# Patient Record
Sex: Male | Born: 1975 | Race: Black or African American | Hispanic: No | Marital: Single | Smoking: Current every day smoker
Health system: Southern US, Community
[De-identification: ages and names within clinical notes are randomized; demographics above are authoritative.]

## PROBLEM LIST (undated history)

## (undated) DIAGNOSIS — G629 Polyneuropathy, unspecified: Secondary | ICD-10-CM

## (undated) DIAGNOSIS — K859 Acute pancreatitis without necrosis or infection, unspecified: Secondary | ICD-10-CM

## (undated) DIAGNOSIS — F32A Depression, unspecified: Secondary | ICD-10-CM

## (undated) HISTORY — PX: TOOTH EXTRACTION: SUR596

## (undated) HISTORY — PX: HIP SURGERY: SHX245

## (undated) HISTORY — DX: Polyneuropathy, unspecified: G62.9

## (undated) HISTORY — DX: Depression, unspecified: F32.A

---

## 2005-03-30 ENCOUNTER — Emergency Department (HOSPITAL_COMMUNITY): Admission: EM | Admit: 2005-03-30 | Discharge: 2005-03-30 | Payer: Self-pay | Admitting: Emergency Medicine

## 2005-03-30 IMAGING — CR DG ABDOMEN ACUTE W/ 1V CHEST
2 series · 2 of 2 positions shown · non-contrast
Comparison: None.

CLINICAL DATA: Abdominal pain.  Nausea.  
 ACUTE ABDOMINAL SERIES:

[view not recorded (1 of 2)]
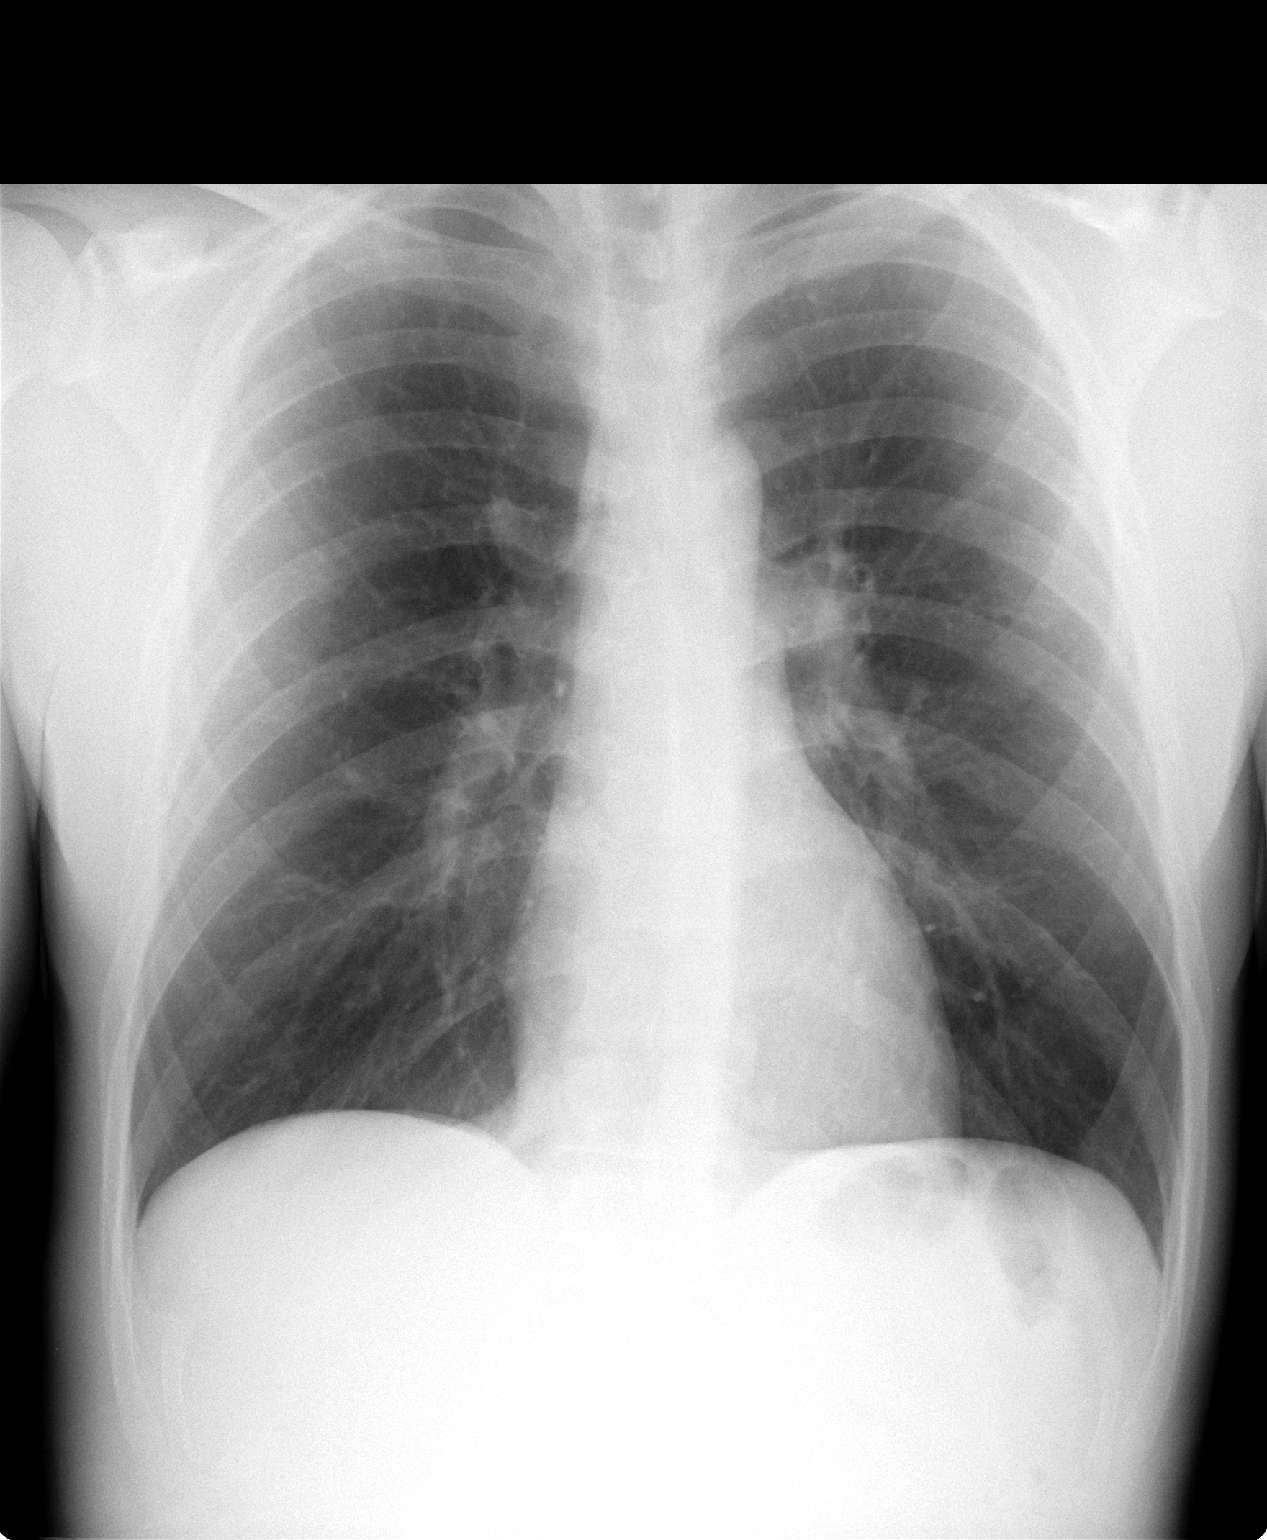

[view not recorded (2 of 2)]
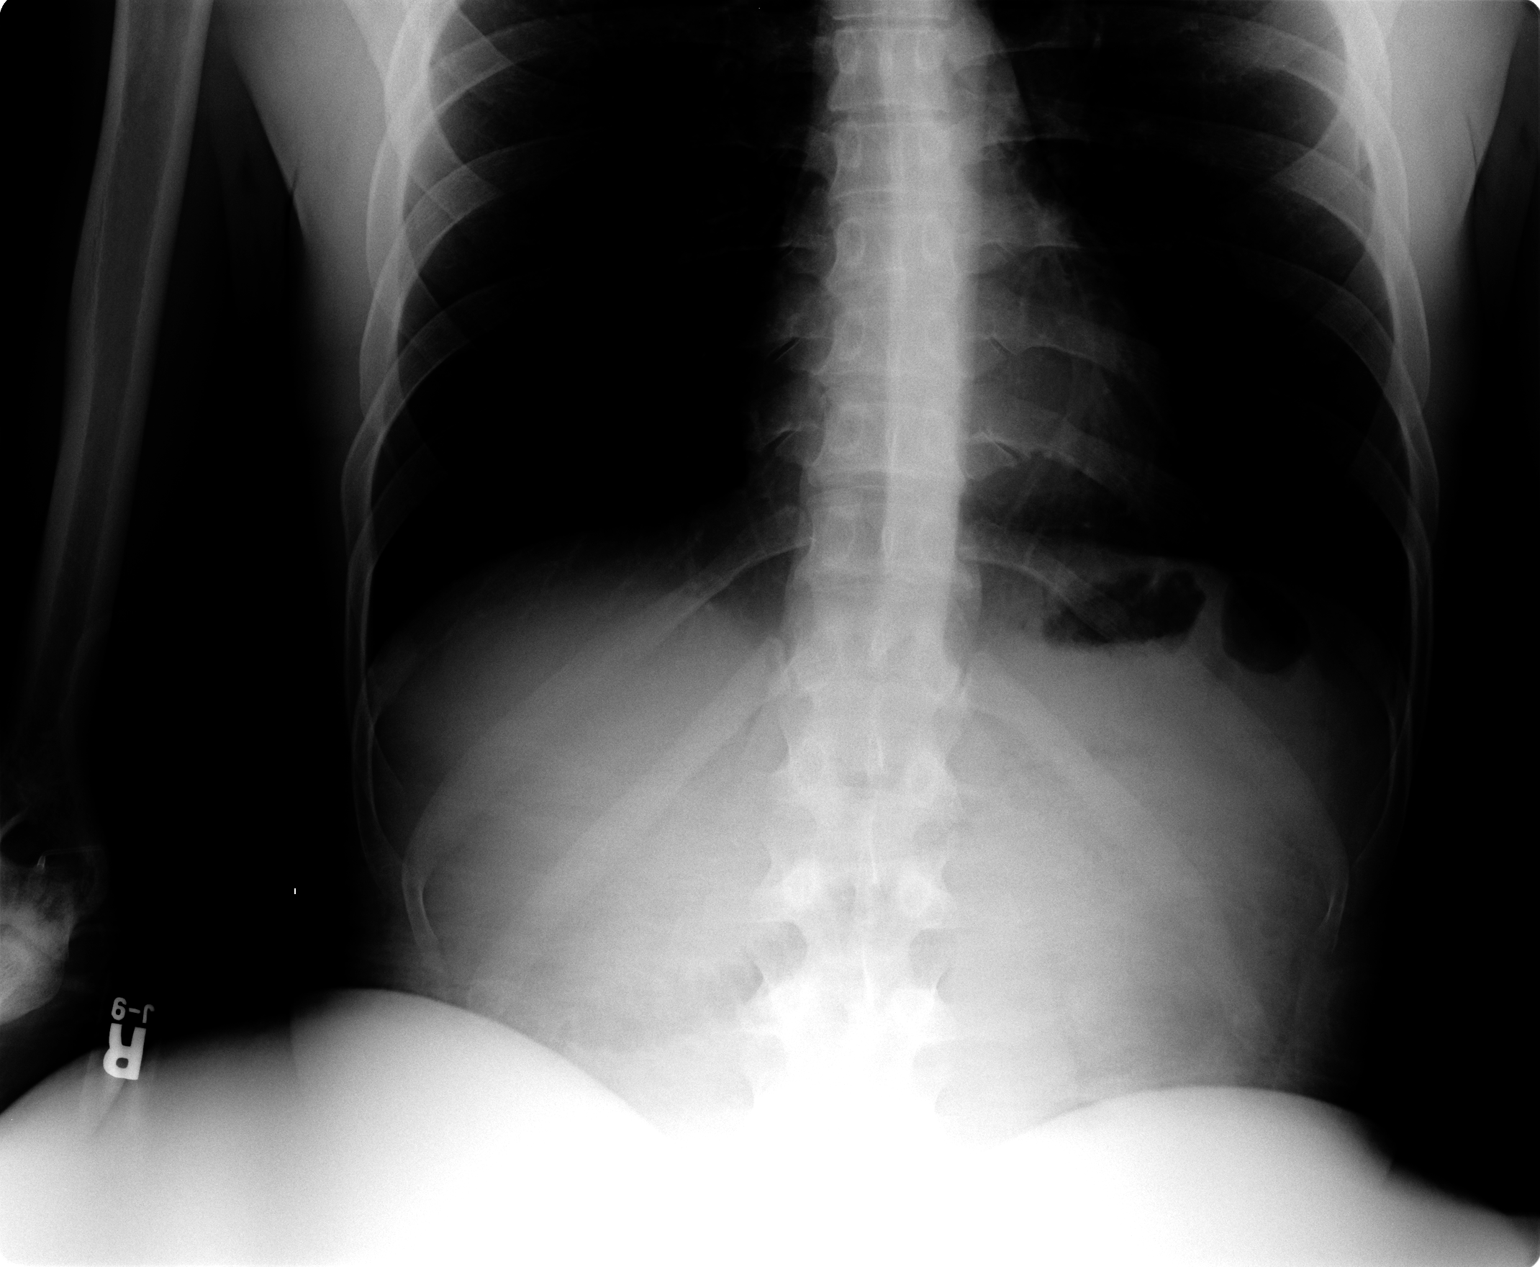

[2 of 2 positions shown; findings below may reference images not displayed]

FINDINGS: There is no evidence of dilated bowel loops or free intraperitoneal air.  No radiopaque calculi or other significant radiographic abnormality is seen.
 Heart size and mediastinal contours are within normal limits.  Both lungs are clear.
IMPRESSION: Negative abdominal radiographs.  No acute cardiopulmonary disease.

## 2007-07-11 ENCOUNTER — Emergency Department (HOSPITAL_COMMUNITY): Admission: EM | Admit: 2007-07-11 | Discharge: 2007-07-11 | Payer: Self-pay | Admitting: Emergency Medicine

## 2010-11-12 ENCOUNTER — Emergency Department: Payer: Self-pay | Admitting: Emergency Medicine

## 2011-10-12 ENCOUNTER — Emergency Department: Payer: Self-pay | Admitting: *Deleted

## 2011-10-12 IMAGING — CT CT HEAD WITHOUT CONTRAST
1 series · 16 of 30 positions shown, 20 images · non-contrast
Comparison: none

REASON FOR EXAM: headache
COMMENTS:

[Series 602: (id) · axial · 0.41mm/px · z∈[+409,+544]mm · 16 of 31 slices shown, 20 images]
[im 2/31  brain]
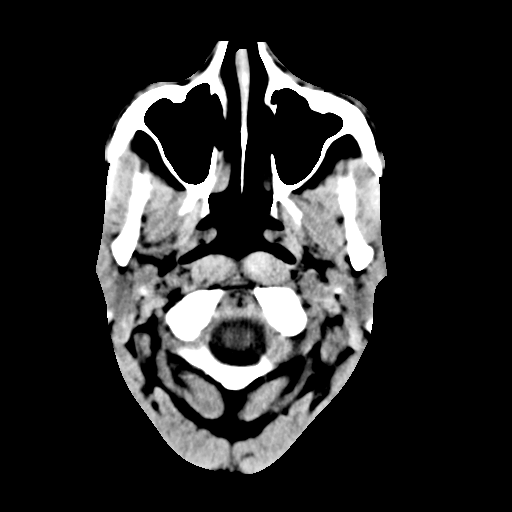
[im 2/31  bone]
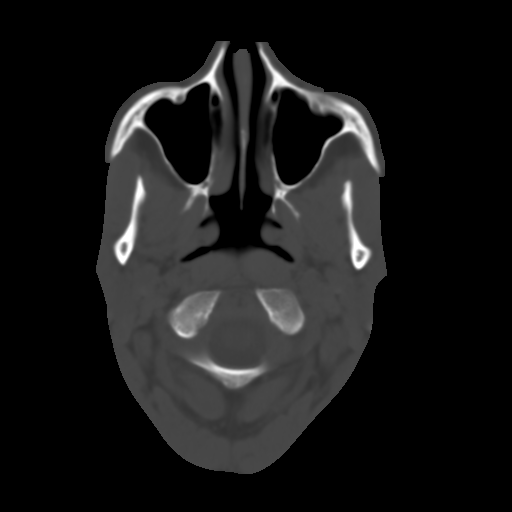
[im 4/31  brain]
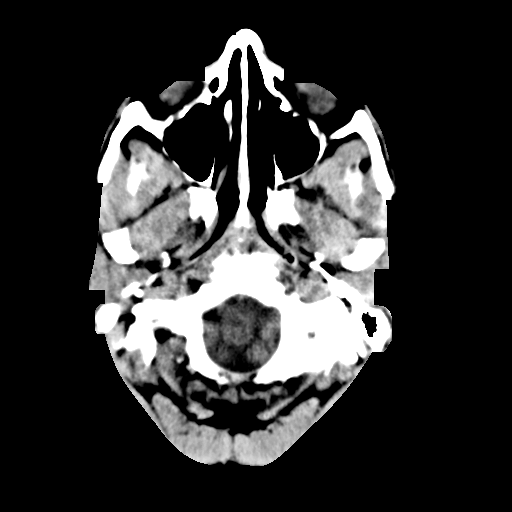
[im 6/31  brain]
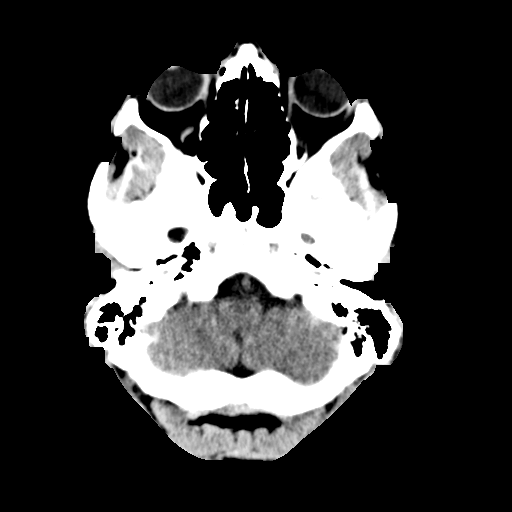
[im 8/31  brain]
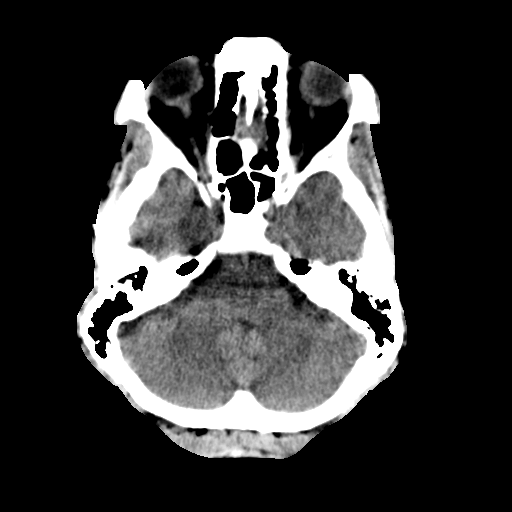
[im 9/31  brain]
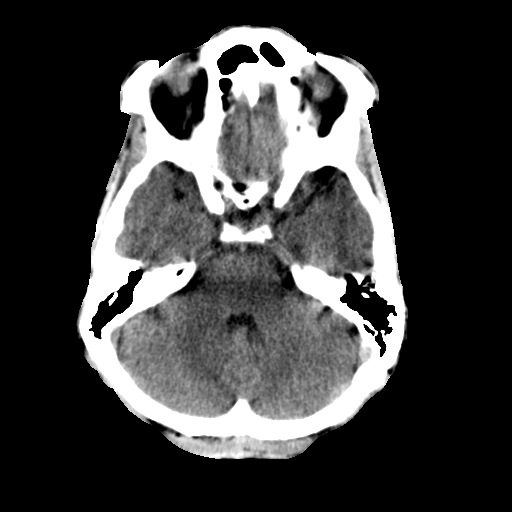
[im 9/31  bone]
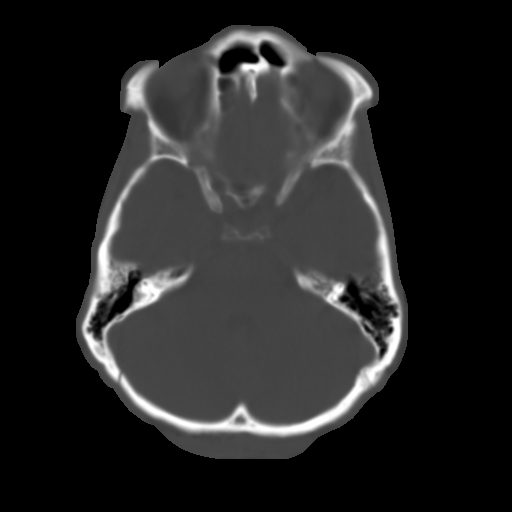
[im 11/31  brain]
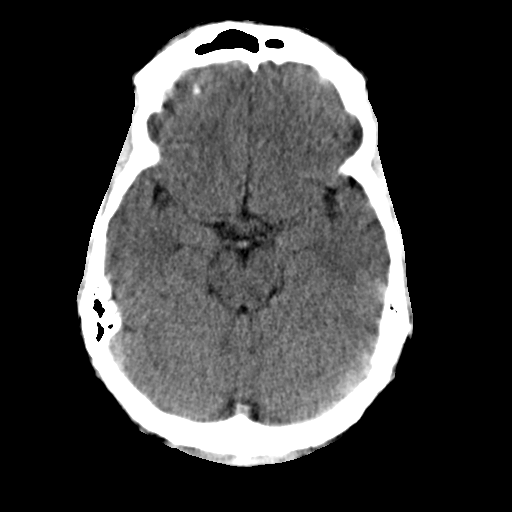
[im 13/31  brain]
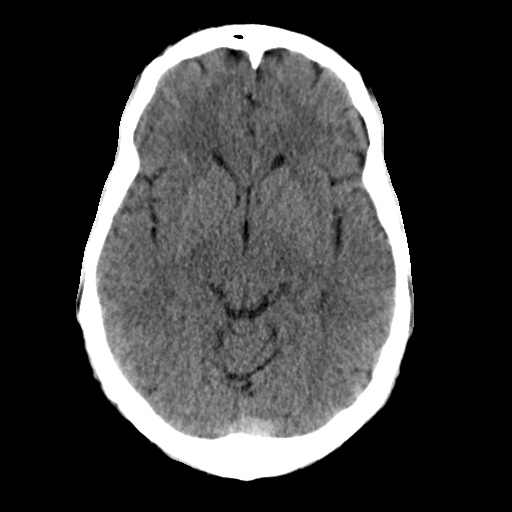
[im 15/31  brain]
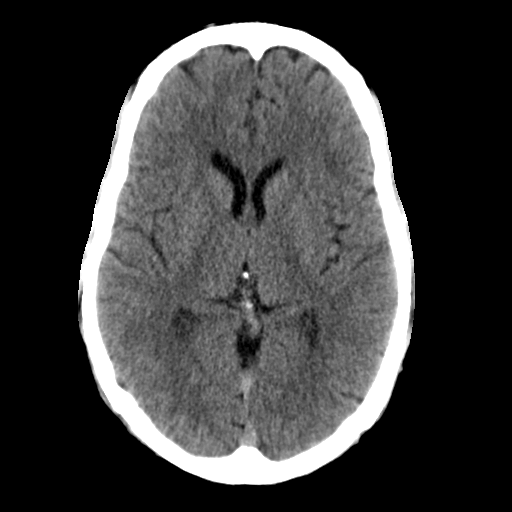
[im 16/31  brain]
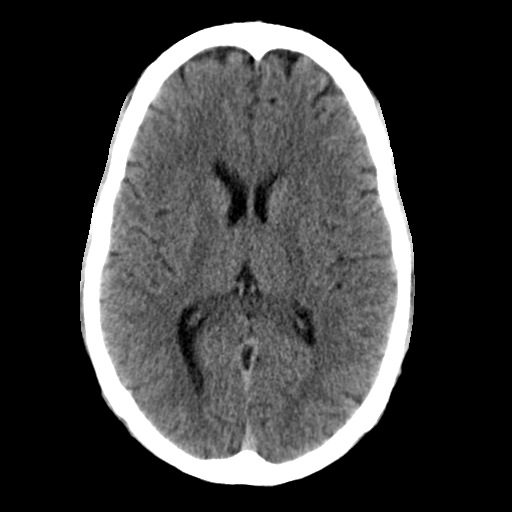
[im 16/31  bone]
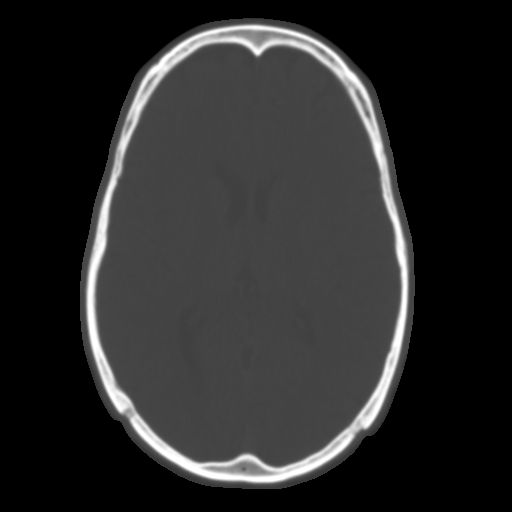
[im 18/31  brain]
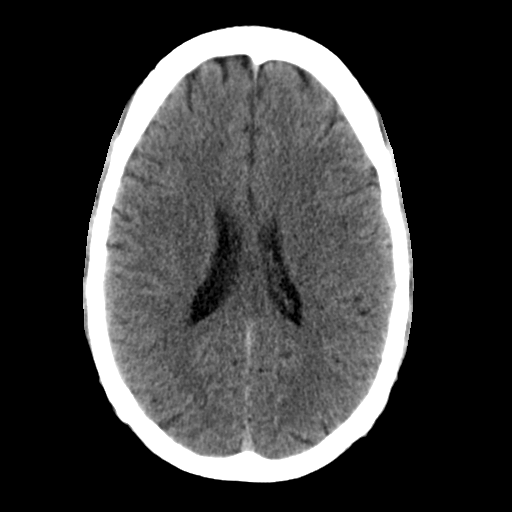
[im 20/31  brain]
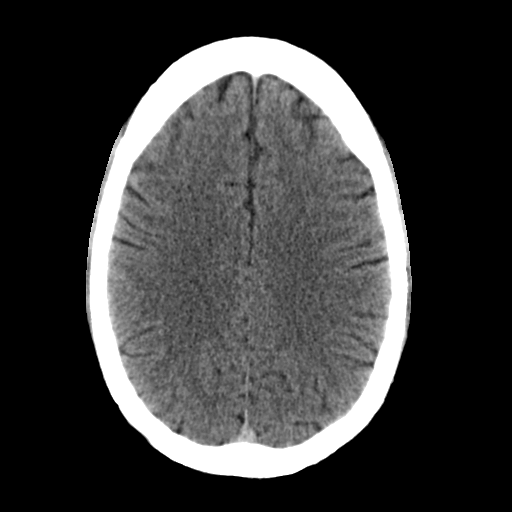
[im 22/31  brain]
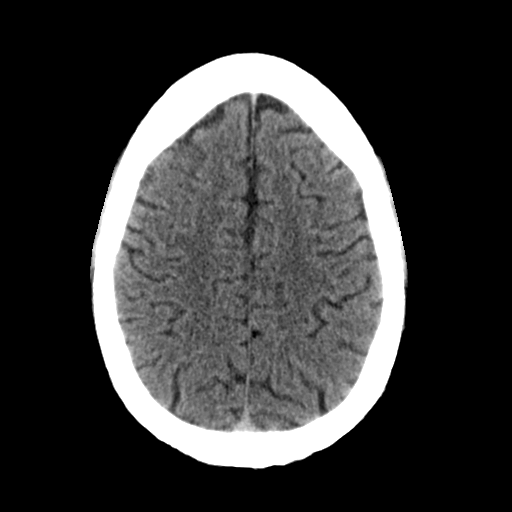
[im 23/31  brain]
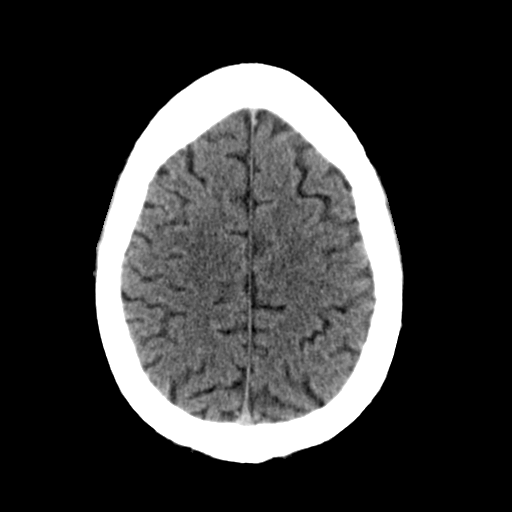
[im 23/31  bone]
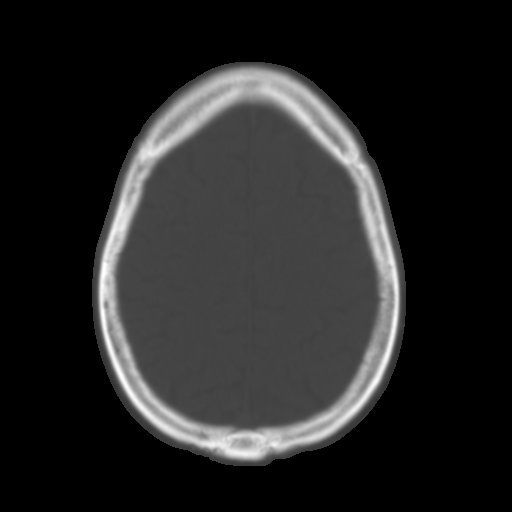
[im 25/31  brain]
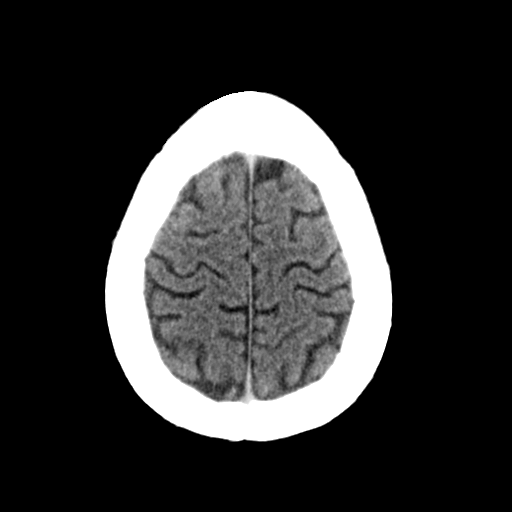
[im 27/31  brain]
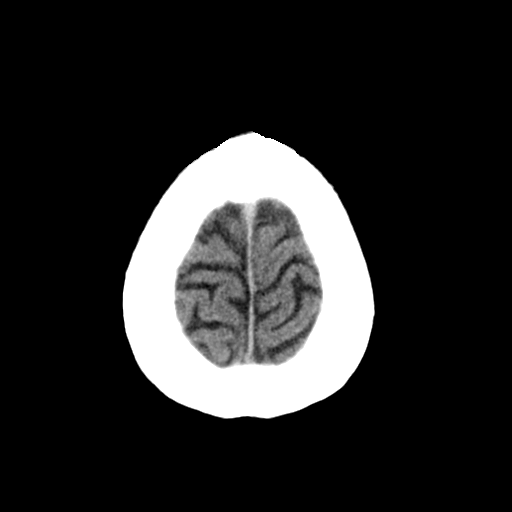
[im 29/31  brain]
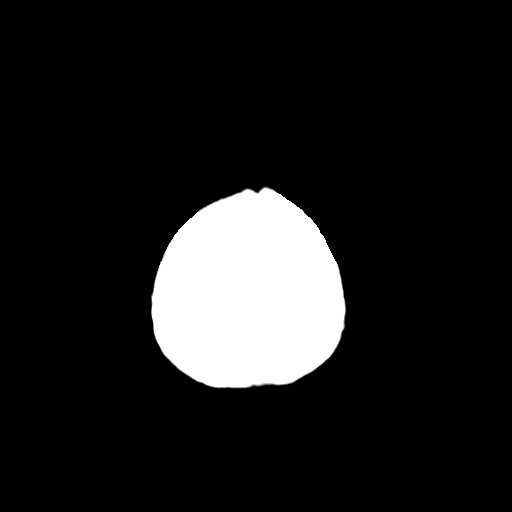

[16 of 30 positions shown; findings below may reference images not displayed]

PROCEDURE:     CT  - CT HEAD WITHOUT CONTRAST  - [DATE]  [DATE]

RESULT:     Noncontrast CT of the brain is performed. The patient has no
previous exam for comparison.

The ventricles and sulci are normal. There is no hemorrhage. There is no
focal mass, mass-effect or midline shift. There is no evidence of edema or
territorial infarct. The bone windows demonstrate normal aeration of the
paranasal sinuses and mastoid air cells. There is no skull fracture
demonstrated.
IMPRESSION: 1. No acute intracranial abnormality. MRI followup is available as
clinically necessary.

[REDACTED]

## 2011-11-26 ENCOUNTER — Emergency Department: Payer: Self-pay | Admitting: Emergency Medicine

## 2011-12-01 ENCOUNTER — Emergency Department: Payer: Self-pay | Admitting: Emergency Medicine

## 2011-12-01 LAB — CBC
HCT: 40.1 % (ref 40.0–52.0)
HGB: 13.3 g/dL (ref 13.0–18.0)
MCH: 30.8 pg (ref 26.0–34.0)
MCHC: 33.2 g/dL (ref 32.0–36.0)
MCV: 93 fL (ref 80–100)
Platelet: 428 10*3/uL (ref 150–440)
RBC: 4.33 10*6/uL — ABNORMAL LOW (ref 4.40–5.90)
RDW: 12.5 % (ref 11.5–14.5)
WBC: 25.4 10*3/uL — ABNORMAL HIGH (ref 3.8–10.6)

## 2011-12-01 LAB — COMPREHENSIVE METABOLIC PANEL
Albumin: 3.2 g/dL — ABNORMAL LOW (ref 3.4–5.0)
Alkaline Phosphatase: 90 U/L (ref 50–136)
Anion Gap: 14 (ref 7–16)
BUN: 46 mg/dL — ABNORMAL HIGH (ref 7–18)
Bilirubin,Total: 0.1 mg/dL — ABNORMAL LOW (ref 0.2–1.0)
Calcium, Total: 9.1 mg/dL (ref 8.5–10.1)
Chloride: 94 mmol/L — ABNORMAL LOW (ref 98–107)
Co2: 23 mmol/L (ref 21–32)
Creatinine: 5.19 mg/dL — ABNORMAL HIGH (ref 0.60–1.30)
EGFR (African American): 15 — ABNORMAL LOW
EGFR (Non-African Amer.): 13 — ABNORMAL LOW
Glucose: 111 mg/dL — ABNORMAL HIGH (ref 65–99)
Osmolality: 275 (ref 275–301)
Potassium: 3.5 mmol/L (ref 3.5–5.1)
SGOT(AST): 23 U/L (ref 15–37)
SGPT (ALT): 14 U/L (ref 12–78)
Sodium: 131 mmol/L — ABNORMAL LOW (ref 136–145)
Total Protein: 7.9 g/dL (ref 6.4–8.2)

## 2011-12-01 IMAGING — CT CT MAXILLOFACIAL WITHOUT CONTRAST
1 series · 15 of 30 positions shown, 19 images · non-contrast
Comparison: none

REASON FOR EXAM: abscess tooth
COMMENTS:

[Series 4: facial st · axial · 0.33mm/px · z∈[-152,+4]mm · 15 of 56 slices shown, 19 images]
[im 2/56  brain]
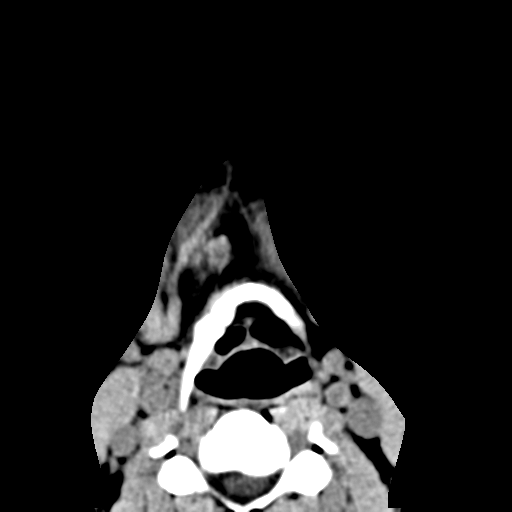
[im 2/56  bone]
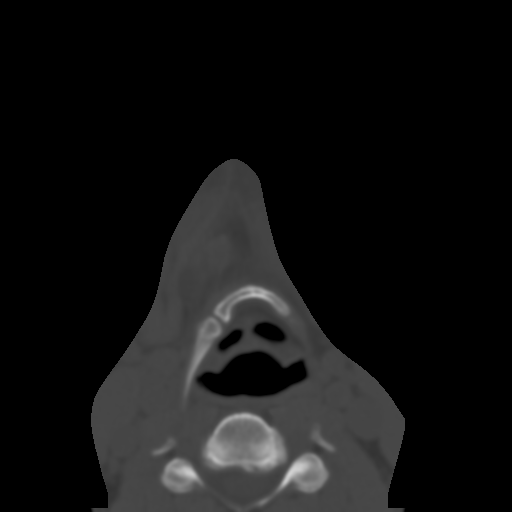
[im 6/56  bone]
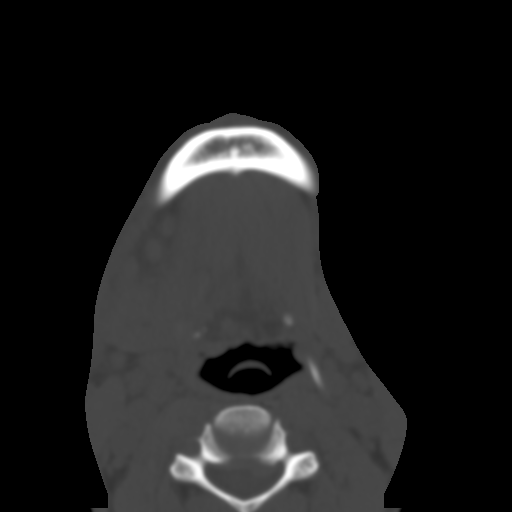
[im 10/56  bone]
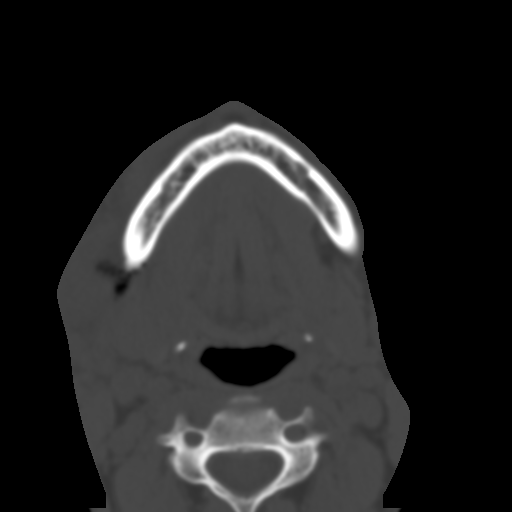
[im 14/56  bone]
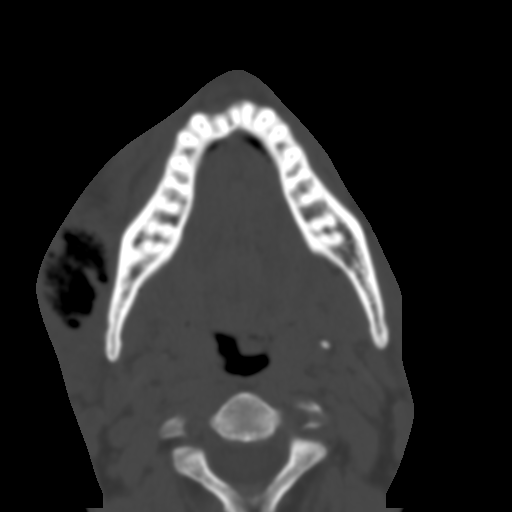
[im 18/56  brain]
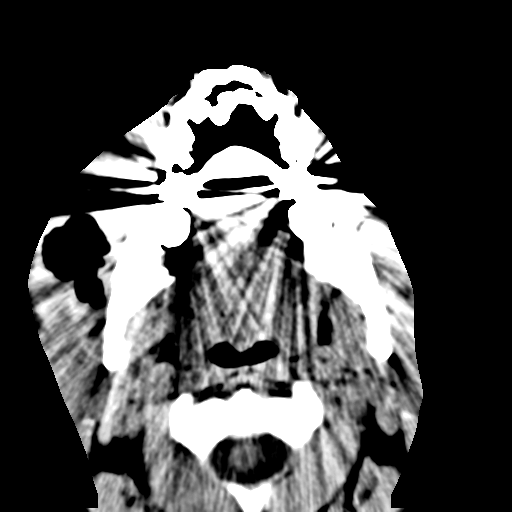
[im 18/56  bone]
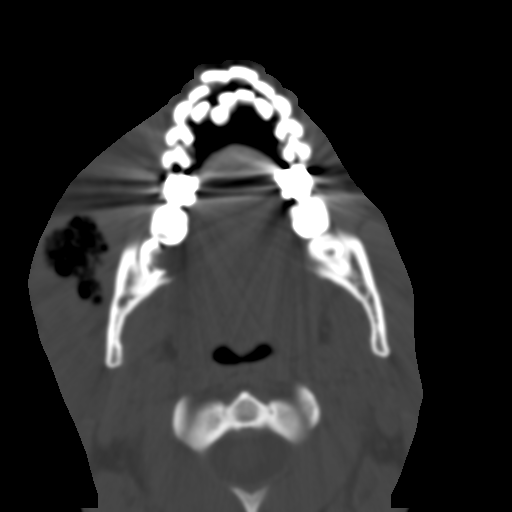
[im 21/56  bone]
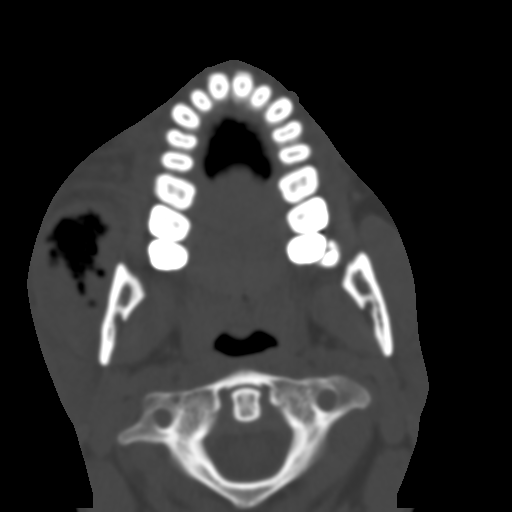
[im 25/56  bone]
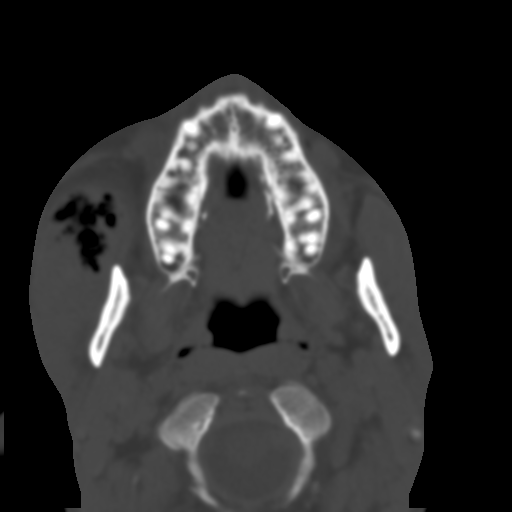
[im 29/56  bone]
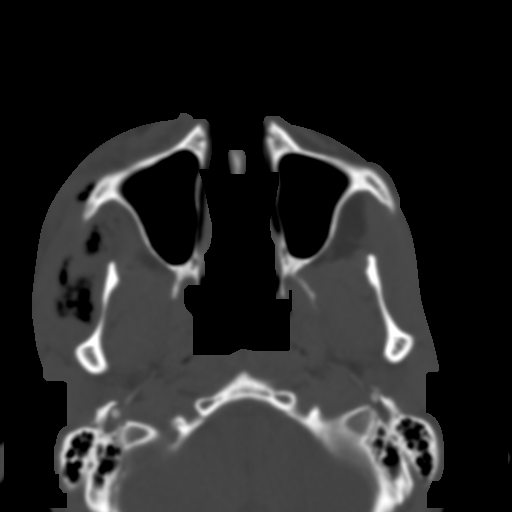
[im 31/56  brain]
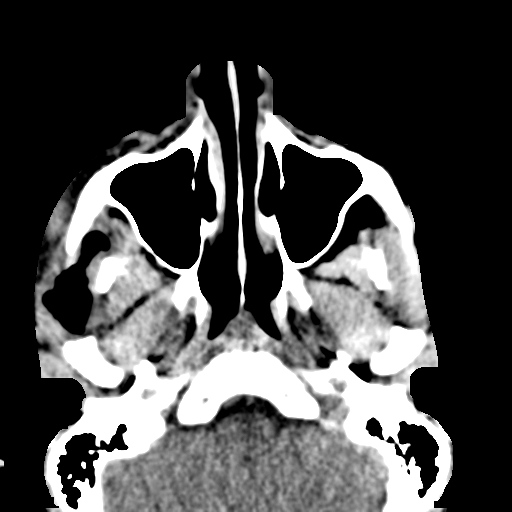
[im 31/56  bone]
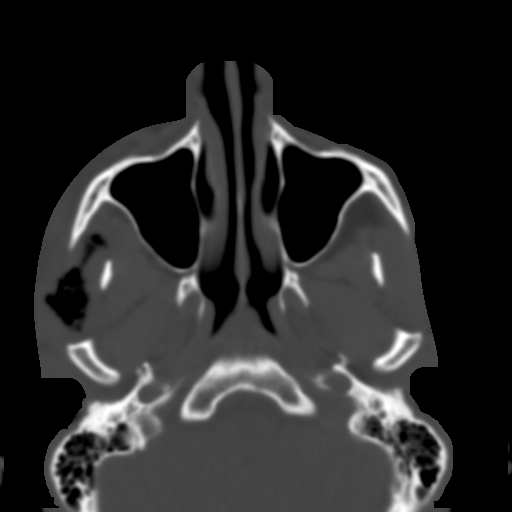
[im 35/56  bone]
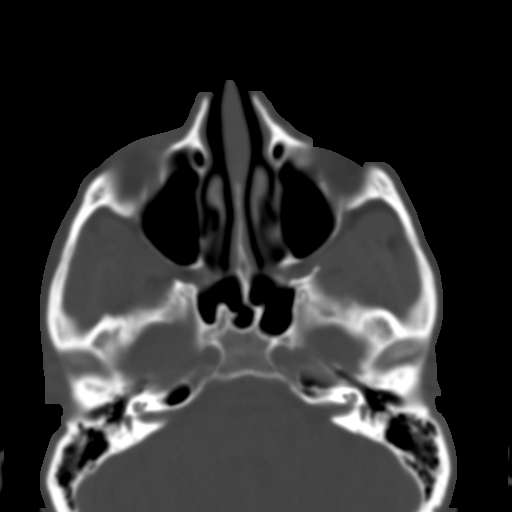
[im 38/56  bone]
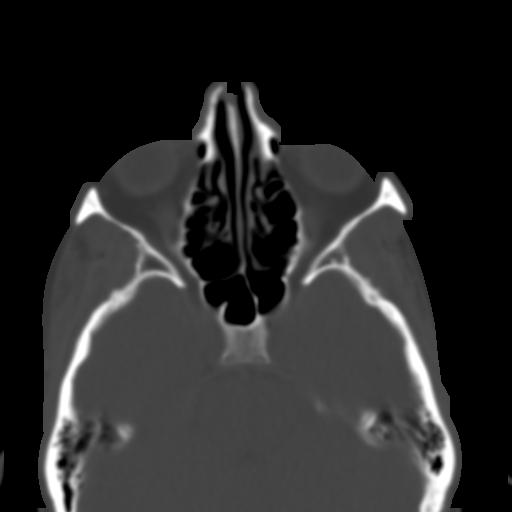
[im 42/56  bone]
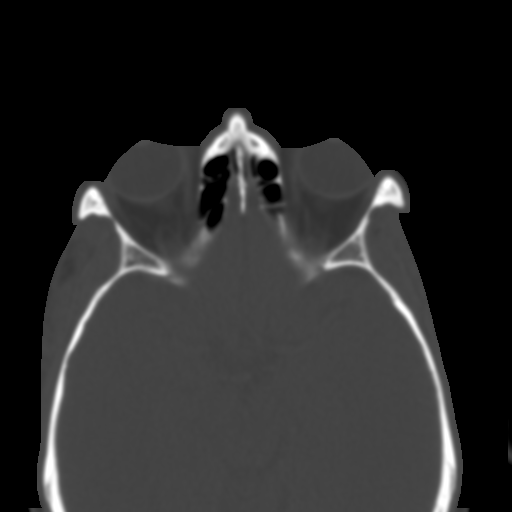
[im 46/56  brain]
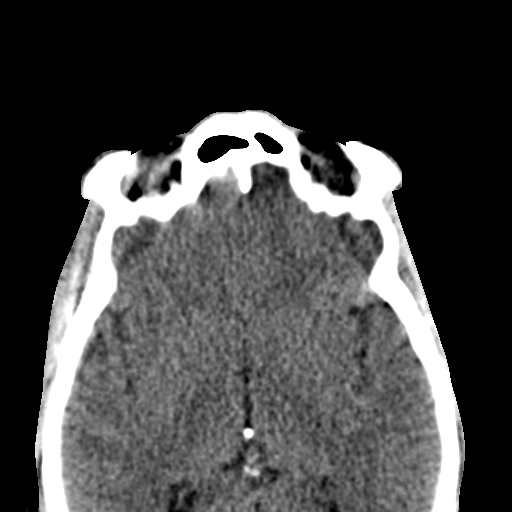
[im 46/56  bone]
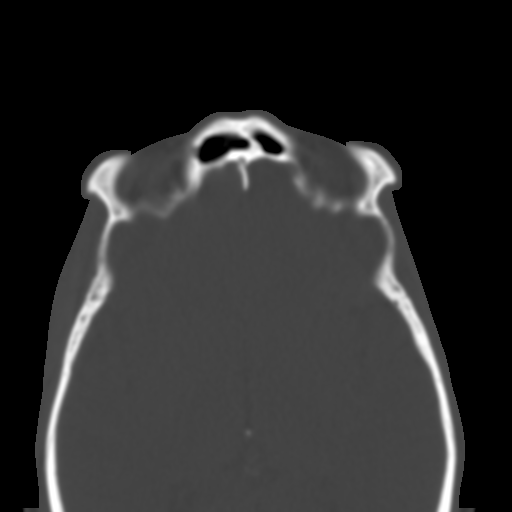
[im 50/56  bone]
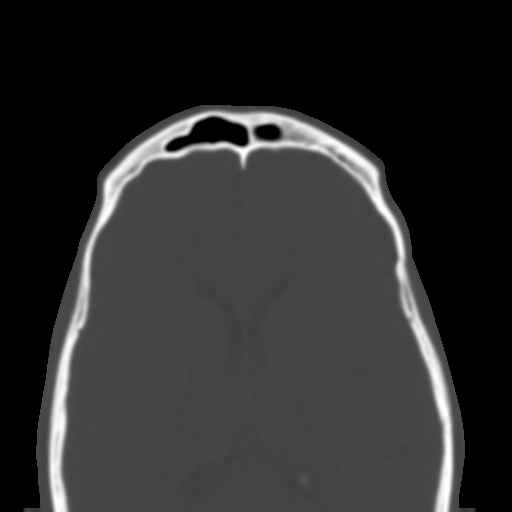
[im 54/56  bone]
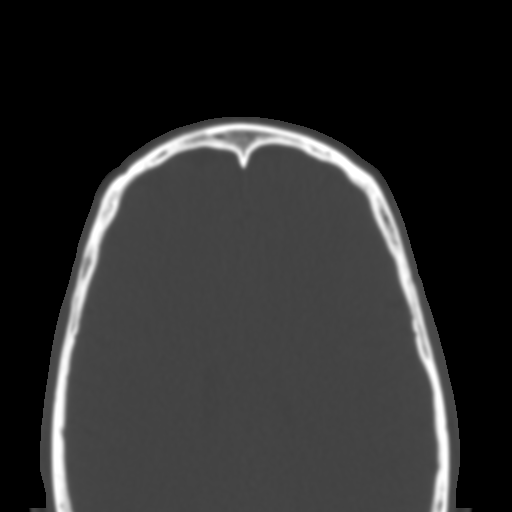

[15 of 30 positions shown; findings below may reference images not displayed]

PROCEDURE:     CT  - CT MAXILLOFACIAL AREA WO  - [DATE]  [DATE]

RESULT:     Axial CT scanning was performed through the facial bones. Bone
and soft tissue windows were photographed. Axial imaging with coronal
reconstructions was performed.

There is a necrotic mass in the right buccal soft tissues. It measures
approximately 4.8 cm AP by 3 cm transversely. It contains gas and soft
tissue density material and is consistent with an abscess. The adjacent
mandible appears intact. There is lucency within the posterior-most lower
molar on the right consistent with caries or fracture. The gas dissects
superiorly to lie deep to the zygomatic arch. No abnormality of the maxilla
is demonstrated. The temporomandibular joints appear intact. No abnormal
fluid is demonstrated within the maxillary sinuses or other paranasal
sinuses.  There are numerous anterior cervical lymph nodes.
IMPRESSION: There is a large necrotic gas-containing mass in the soft
tissues of the right cheek which appears to be related to a fracture or
decayed posterior molar on the right.

[REDACTED]

## 2011-12-07 LAB — CULTURE, BLOOD (SINGLE)

## 2014-04-20 ENCOUNTER — Emergency Department: Payer: Self-pay | Admitting: Emergency Medicine

## 2014-04-20 LAB — COMPREHENSIVE METABOLIC PANEL
Albumin: 3.2 g/dL — ABNORMAL LOW (ref 3.4–5.0)
Alkaline Phosphatase: 52 U/L (ref 46–116)
Anion Gap: 7 (ref 7–16)
BUN: 11 mg/dL (ref 7–18)
Bilirubin,Total: 0.4 mg/dL (ref 0.2–1.0)
Calcium, Total: 8.7 mg/dL (ref 8.5–10.1)
Chloride: 100 mmol/L (ref 98–107)
Co2: 28 mmol/L (ref 21–32)
Creatinine: 1.1 mg/dL (ref 0.60–1.30)
EGFR (African American): >60
EGFR (Non-African Amer.): >60
Glucose: 144 mg/dL — ABNORMAL HIGH (ref 65–99)
Osmolality: 272 (ref 275–301)
Potassium: 3.6 mmol/L (ref 3.5–5.1)
SGOT(AST): 33 U/L (ref 15–37)
SGPT (ALT): 21 U/L (ref 14–63)
Sodium: 135 mmol/L — ABNORMAL LOW (ref 136–145)
Total Protein: 6.9 g/dL (ref 6.4–8.2)

## 2014-04-20 LAB — CBC
HCT: 48.4 % (ref 40.0–52.0)
HGB: 16.6 g/dL (ref 13.0–18.0)
MCH: 31.7 pg (ref 26.0–34.0)
MCHC: 34.4 g/dL (ref 32.0–36.0)
MCV: 92 fL (ref 80–100)
Platelet: 252 10*3/uL (ref 150–440)
RBC: 5.26 10*6/uL (ref 4.40–5.90)
RDW: 12.9 % (ref 11.5–14.5)
WBC: 9.7 10*3/uL (ref 3.8–10.6)

## 2014-12-21 ENCOUNTER — Emergency Department
Admission: EM | Admit: 2014-12-21 | Discharge: 2014-12-21 | Disposition: A | Discharge status: Home / Self Care | Payer: Self-pay | Attending: Emergency Medicine | Admitting: Emergency Medicine

## 2014-12-21 ENCOUNTER — Emergency Department: Payer: Self-pay

## 2014-12-21 ENCOUNTER — Encounter: Payer: Self-pay | Admitting: Emergency Medicine

## 2014-12-21 DIAGNOSIS — R1013 Epigastric pain: Secondary | ICD-10-CM

## 2014-12-21 DIAGNOSIS — Z720 Tobacco use: Secondary | ICD-10-CM | POA: Insufficient documentation

## 2014-12-21 DIAGNOSIS — K859 Acute pancreatitis without necrosis or infection, unspecified: Secondary | ICD-10-CM | POA: Insufficient documentation

## 2014-12-21 LAB — CBC
HCT: 47 % (ref 40.0–52.0)
Hemoglobin: 15.7 g/dL (ref 13.0–18.0)
MCH: 31.5 pg (ref 26.0–34.0)
MCHC: 33.5 g/dL (ref 32.0–36.0)
MCV: 94.2 fL (ref 80.0–100.0)
Platelets: 319 10*3/uL (ref 150–440)
RBC: 5 MIL/uL (ref 4.40–5.90)
RDW: 13.1 % (ref 11.5–14.5)
WBC: 11.3 10*3/uL — ABNORMAL HIGH (ref 3.8–10.6)

## 2014-12-21 LAB — COMPREHENSIVE METABOLIC PANEL
ALT: 15 U/L — ABNORMAL LOW (ref 17–63)
AST: 20 U/L (ref 15–41)
Albumin: 3.8 g/dL (ref 3.5–5.0)
Alkaline Phosphatase: 54 U/L (ref 38–126)
Anion gap: 6 (ref 5–15)
BUN: 8 mg/dL (ref 6–20)
CO2: 27 mmol/L (ref 22–32)
Calcium: 8.9 mg/dL (ref 8.9–10.3)
Chloride: 102 mmol/L (ref 101–111)
Creatinine, Ser: 0.89 mg/dL (ref 0.61–1.24)
GFR calc Af Amer: >60 mL/min (ref >60)
GFR calc non Af Amer: >60 mL/min (ref >60)
Glucose, Bld: 102 mg/dL — ABNORMAL HIGH (ref 65–99)
Potassium: 3.8 mmol/L (ref 3.5–5.1)
Sodium: 135 mmol/L (ref 135–145)
Total Bilirubin: 0.8 mg/dL (ref 0.3–1.2)
Total Protein: 6.8 g/dL (ref 6.5–8.1)

## 2014-12-21 LAB — TROPONIN I: Troponin I: <0.03 ng/mL (ref <0.031)

## 2014-12-21 LAB — LIPASE, BLOOD: Lipase: 69 U/L — ABNORMAL HIGH (ref 22–51)

## 2014-12-21 IMAGING — CT CT ABD-PELV W/ CM
2 of 5 series · 16 of 46 positions shown, 18 images · IV contrast (omnipaque)
Comparison: None.

CLINICAL DATA: mid epigastric pain on [REDACTED], took antacids and
pain continued. States he feels like he is having a heart attack,
pain radiates through to his back, denies hx of cp.

EXAM:
CT ABDOMEN AND PELVIS WITH CONTRAST
TECHNIQUE: Multidetector CT imaging of the abdomen and pelvis was performed
using the standard protocol following bolus administration of
intravenous contrast.
CONTRAST:  100mL OMNIPAQUE IOHEXOL 300 MG/ML  SOLN

[Series 2: routine abd pel with · axial · 0.64mm/px · z∈[-882,-522]mm · 13 of 82 slices shown, 15 images]
[im 5/82  soft-tissue]
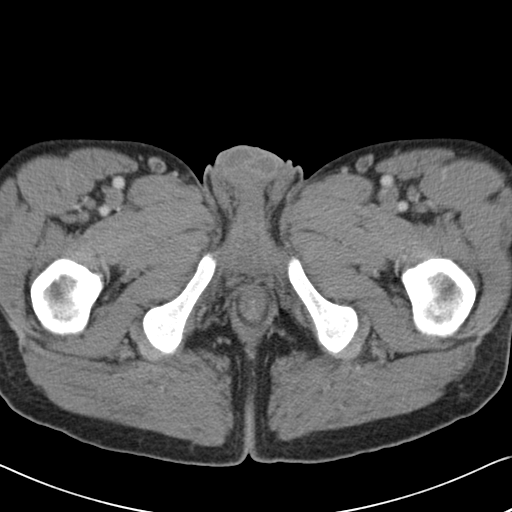
[im 5/82  bone]
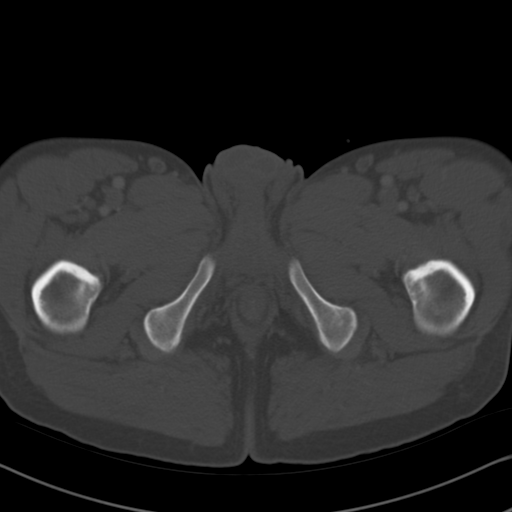
[im 13/82  soft-tissue]
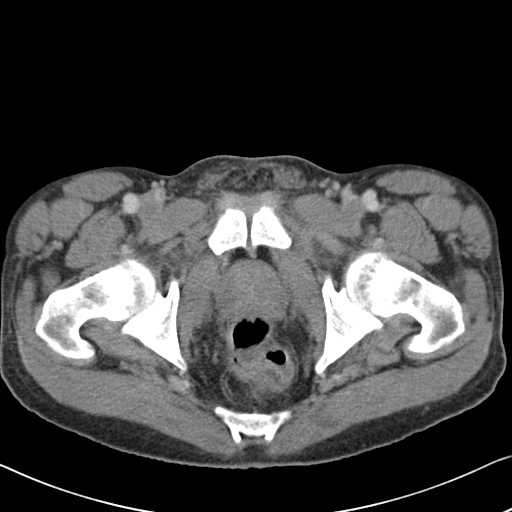
[im 17/82  soft-tissue]
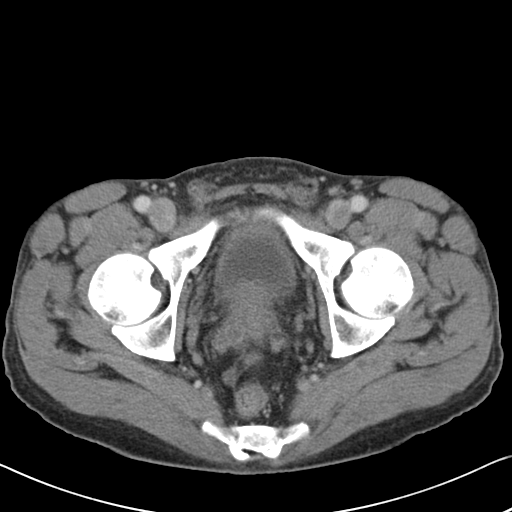
[im 25/82  soft-tissue]
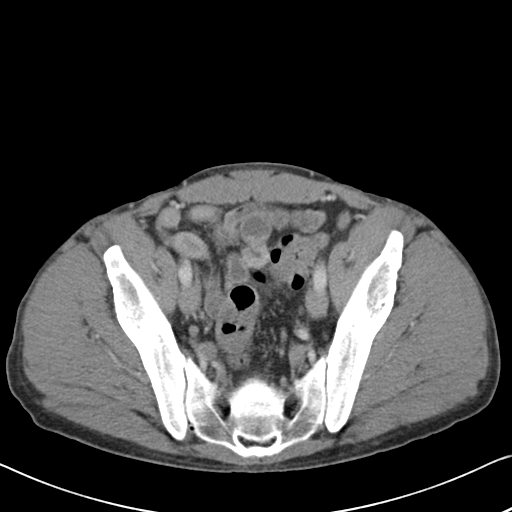
[im 29/82  soft-tissue]
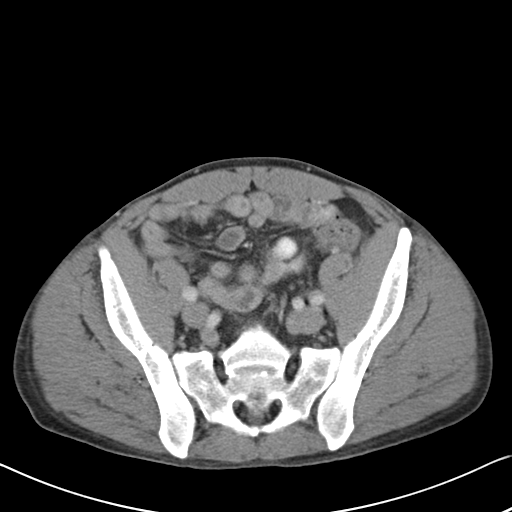
[im 37/82  soft-tissue]
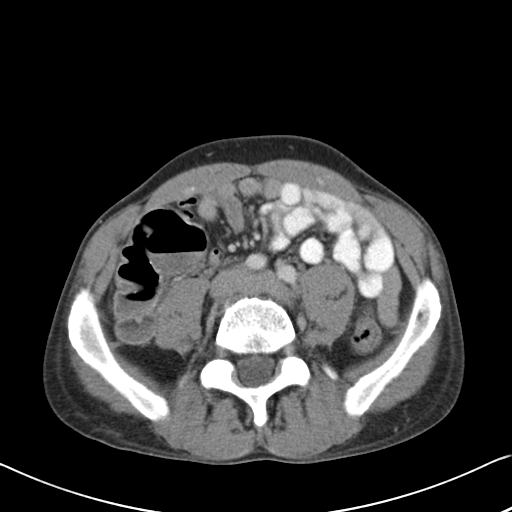
[im 41/82  soft-tissue]
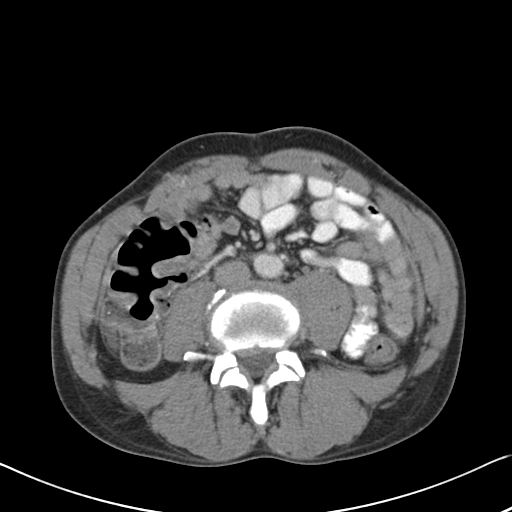
[im 45/82  soft-tissue]
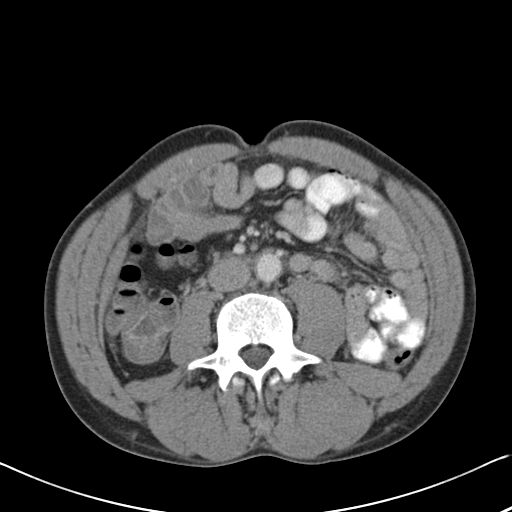
[im 53/82  soft-tissue]
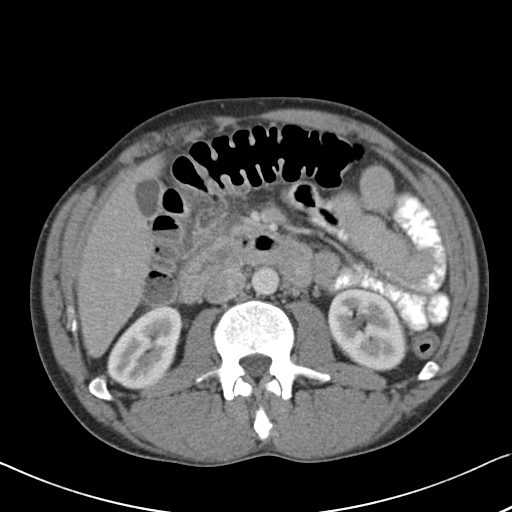
[im 53/82  bone]
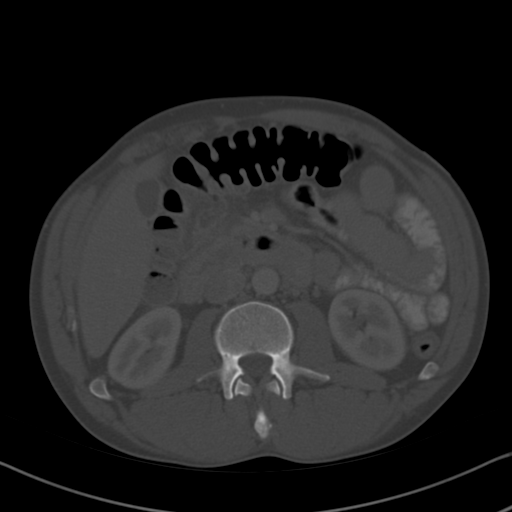
[im 57/82  soft-tissue]
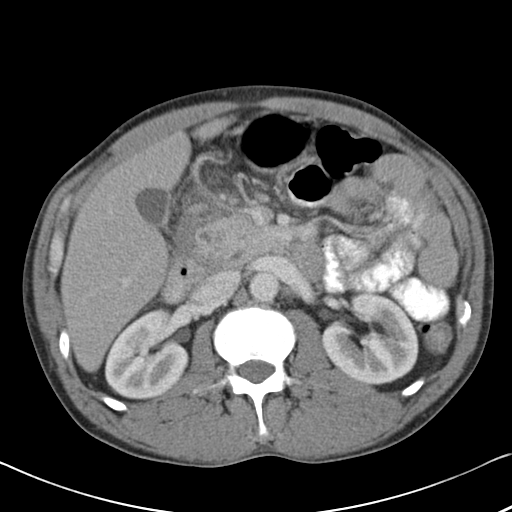
[im 65/82  soft-tissue]
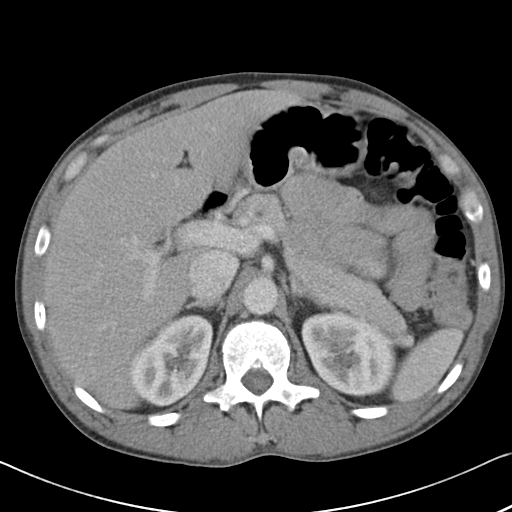
[im 69/82  soft-tissue]
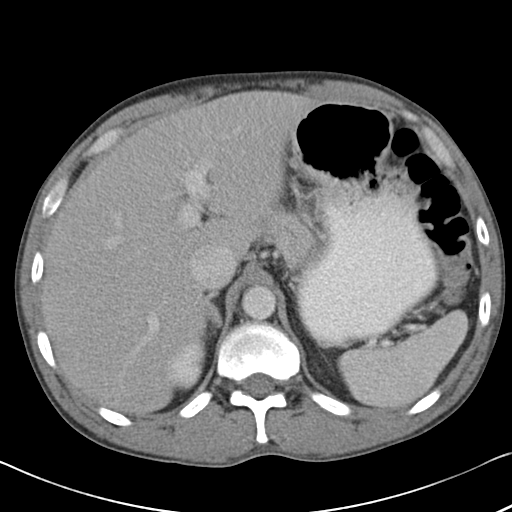
[im 77/82  soft-tissue]
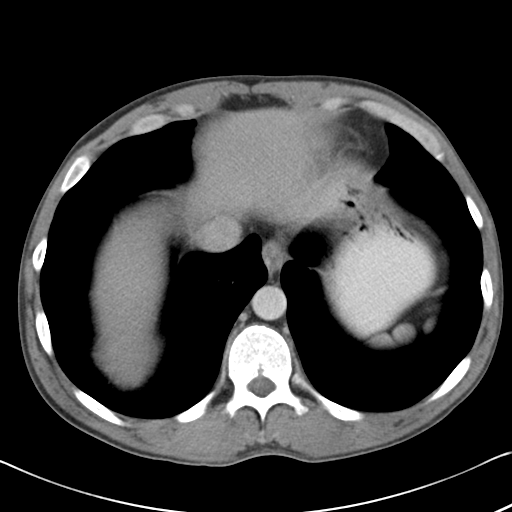

[Series 5: cor routine abd pel with · coronal · 0.59mm/px · 3 of 125 slices shown]
[im 42/125  soft-tissue]
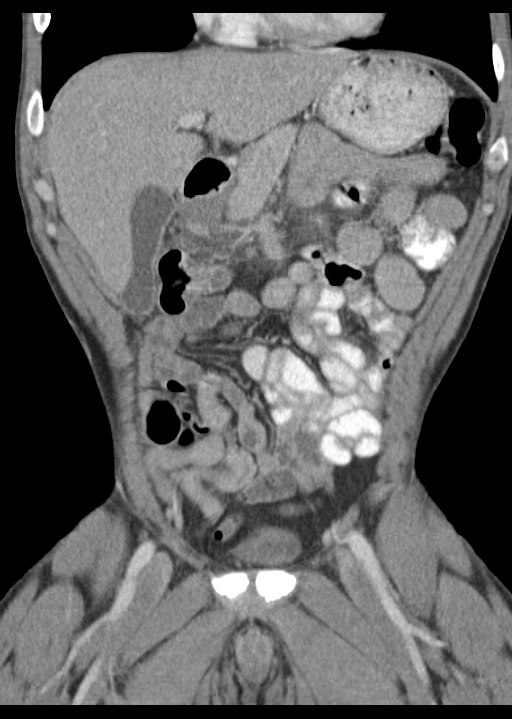
[im 56/125  soft-tissue]
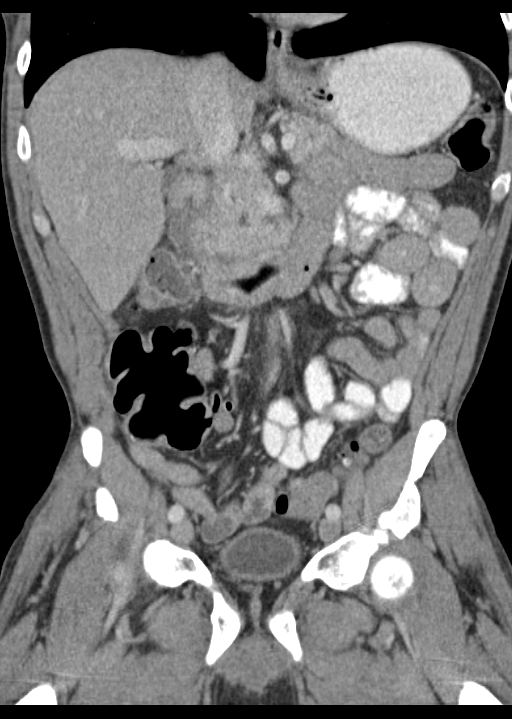
[im 69/125  soft-tissue]
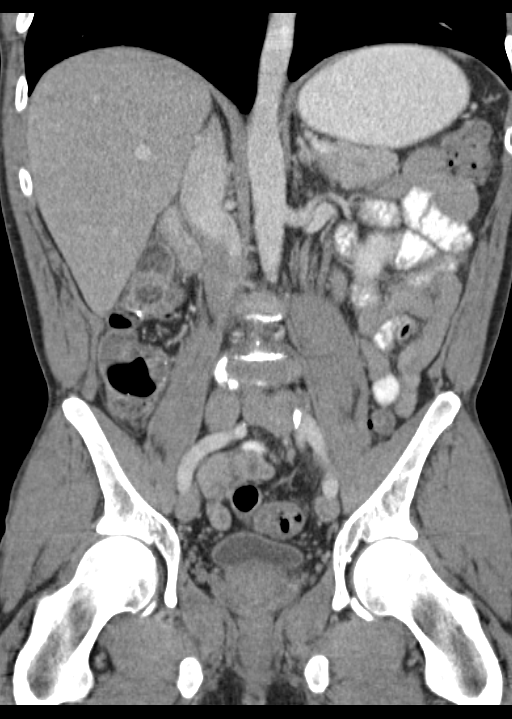

[16 of 46 positions shown; findings below may reference images not displayed]

FINDINGS: Visualized lung bases clear. Unremarkable liver, nondilated
gallbladder, spleen, adrenal glands, kidneys, pancreas. There are
inflammatory/edematous changes inferior to the proximal duodenum,
adjacent to the pancreatic head and inferior margin of the
gallbladder. There is a focal low-attenuation region in the inferior
wall of the second portion duodenum. No loculated extraluminal fluid
collections. No free air. No ascites.

Stomach is physiologically distended. Small bowel and colon are
nondilated. Urinary bladder incompletely distended. Mild scattered
aortoiliac arterial calcifications. Portal vein and splenic vein are
patent. No adenopathy localized. No hydronephrosis. Disc protrusions
L3-S1.
IMPRESSION: 1. Focal inflammatory/edematous process in the right upper abdomen,
favor peptic ulcer disease over pancreatitis. Less likely
cholecystitis given lack of gallbladder wall thickening. No evidence
of perforation or abscess.

## 2014-12-21 MED ORDER — OXYCODONE-ACETAMINOPHEN 5-325 MG PO TABS
2.0000 | ORAL_TABLET | Freq: Once | ORAL | Status: AC
  Administered 2014-12-21: 2 via ORAL
  Filled 2014-12-21: qty 2

## 2014-12-21 MED ORDER — OXYCODONE-ACETAMINOPHEN 5-325 MG PO TABS: 1.0000 | ORAL_TABLET | ORAL | Status: DC | PRN

## 2014-12-21 MED ORDER — ONDANSETRON HCL 4 MG PO TABS: 4.0000 mg | ORAL_TABLET | Freq: Three times a day (TID) | ORAL | Status: DC | PRN

## 2014-12-21 MED ORDER — MORPHINE SULFATE (PF) 4 MG/ML IV SOLN
4.0000 mg | Freq: Once | INTRAVENOUS | Status: AC
  Administered 2014-12-21: 4 mg via INTRAVENOUS
  Filled 2014-12-21: qty 1

## 2014-12-21 MED ORDER — IOHEXOL 300 MG/ML  SOLN
100.0000 mL | Freq: Once | INTRAMUSCULAR | Status: AC | PRN
  Administered 2014-12-21: 100 mL via INTRAVENOUS

## 2014-12-21 MED ORDER — IOHEXOL 240 MG/ML SOLN
25.0000 mL | Freq: Once | INTRAMUSCULAR | Status: AC | PRN
  Administered 2014-12-21: 25 mL via ORAL

## 2014-12-21 MED ORDER — ONDANSETRON HCL 4 MG/2ML IJ SOLN
4.0000 mg | Freq: Once | INTRAMUSCULAR | Status: AC
  Administered 2014-12-21: 4 mg via INTRAVENOUS
  Filled 2014-12-21: qty 2

## 2014-12-21 MED ORDER — HYDROMORPHONE HCL 1 MG/ML IJ SOLN
1.0000 mg | Freq: Once | INTRAMUSCULAR | Status: AC
  Administered 2014-12-21: 1 mg via INTRAVENOUS
  Filled 2014-12-21: qty 1

## 2014-12-21 MED ORDER — SODIUM CHLORIDE 0.9 % IV BOLUS (SEPSIS)
1000.0000 mL | Freq: Once | INTRAVENOUS | Status: AC
  Administered 2014-12-21: 1000 mL via INTRAVENOUS

## 2014-12-21 NOTE — Discharge Instructions (Signed)
You were evaluated for upper abdominal pain and found to have evidence of pancreatitis and likely also gastritis. Eat only a liquid diet for 2 days, then advance to a soft diet for 2 days, and then advance as tolerated to a full/regular diet.  Return to the emergency department for any worsening condition including worsening abdominal pain, any chest pain, trouble breathing, fever, vomiting blood, black or bloody stools, or any other symptoms concerning to you.  Gastritis, Adult Gastritis is soreness and swelling (inflammation) of the lining of the stomach. Gastritis can develop as a sudden onset (acute) or long-term (chronic) condition. If gastritis is not treated, it can lead to stomach bleeding and ulcers. CAUSES  Gastritis occurs when the stomach lining is weak or damaged. Digestive juices from the stomach then inflame the weakened stomach lining. The stomach lining may be weak or damaged due to viral or bacterial infections. One common bacterial infection is the Helicobacter pylori infection. Gastritis can also result from excessive alcohol consumption, taking certain medicines, or having too much acid in the stomach.  SYMPTOMS  In some cases, there are no symptoms. When symptoms are present, they may include:  Pain or a burning sensation in the upper abdomen.  Nausea.  Vomiting.  An uncomfortable feeling of fullness after eating. DIAGNOSIS  Your caregiver may suspect you have gastritis based on your symptoms and a physical exam. To determine the cause of your gastritis, your caregiver may perform the following:  Blood or stool tests to check for the H pylori bacterium.  Gastroscopy. A thin, flexible tube (endoscope) is passed down the esophagus and into the stomach. The endoscope has a light and camera on the end. Your caregiver uses the endoscope to view the inside of the stomach.  Taking a tissue sample (biopsy) from the stomach to examine under a microscope. TREATMENT  Depending  on the cause of your gastritis, medicines may be prescribed. If you have a bacterial infection, such as an H pylori infection, antibiotics may be given. If your gastritis is caused by too much acid in the stomach, H2 blockers or antacids may be given. Your caregiver may recommend that you stop taking aspirin, ibuprofen, or other nonsteroidal anti-inflammatory drugs (NSAIDs). HOME CARE INSTRUCTIONS  Only take over-the-counter or prescription medicines as directed by your caregiver.  If you were given antibiotic medicines, take them as directed. Finish them even if you start to feel better.  Drink enough fluids to keep your urine clear or pale yellow.  Avoid foods and drinks that make your symptoms worse, such as:  Caffeine or alcoholic drinks.  Chocolate.  Peppermint or mint flavorings.  Garlic and onions.  Spicy foods.  Citrus fruits, such as oranges, lemons, or limes.  Tomato-based foods such as sauce, chili, salsa, and pizza.  Fried and fatty foods.  Eat small, frequent meals instead of large meals. SEEK IMMEDIATE MEDICAL CARE IF:   You have black or dark red stools.  You vomit blood or material that looks like coffee grounds.  You are unable to keep fluids down.  Your abdominal pain gets worse.  You have a fever.  You do not feel better after 1 week.  You have any other questions or concerns. MAKE SURE YOU:  Understand these instructions.  Will watch your condition.  Will get help right away if you are not doing well or get worse. Document Released: 03/01/2001 Document Revised: 09/06/2011 Document Reviewed: 04/20/2011 [facility] Patient Information 2015 [place], [place]. This information is not intended to  replace advice given to you by your health care provider. Make sure you discuss any questions you have with your health care provider. Acute Pancreatitis Acute pancreatitis is a disease in which the pancreas becomes suddenly inflamed. The pancreas is a large  gland located behind your stomach. The pancreas produces enzymes that help digest food. The pancreas also releases the hormones glucagon and insulin that help regulate blood sugar. Damage to the pancreas occurs when the digestive enzymes from the pancreas are activated and begin attacking the pancreas before being released into the intestine. Most acute attacks last a couple of days and can cause serious complications. Some people become dehydrated and develop low blood pressure. In severe cases, bleeding into the pancreas can lead to shock and can be life-threatening. The lungs, heart, and kidneys may fail. CAUSES  Pancreatitis can happen to anyone. In some cases, the cause is unknown. Most cases are caused by:  Alcohol abuse.  Gallstones. Other less common causes are:  Certain medicines.  Exposure to certain chemicals.  Infection.  Damage caused by an accident (trauma).  Abdominal surgery. SYMPTOMS   Pain in the upper abdomen that may radiate to the back.  Tenderness and swelling of the abdomen.  Nausea and vomiting. DIAGNOSIS  Your caregiver will perform a physical exam. Blood and stool tests may be done to confirm the diagnosis. Imaging tests may also be done, such as X-rays, CT scans, or an ultrasound of the abdomen. TREATMENT  Treatment usually requires a stay in the hospital. Treatment may include:  Pain medicine.  Fluid replacement through an intravenous line (IV).  Placing a tube in the stomach to remove stomach contents and control vomiting.  Not eating for 3 or 4 days. This gives your pancreas a rest, because enzymes are not being produced that can cause further damage.  Antibiotic medicines if your condition is caused by an infection.  Surgery of the pancreas or gallbladder. HOME CARE INSTRUCTIONS   Follow the diet advised by your caregiver. This may involve avoiding alcohol and decreasing the amount of fat in your diet.  Eat smaller, more frequent meals. This  reduces the amount of digestive juices the pancreas produces.  Drink enough fluids to keep your urine clear or pale yellow.  Only take over-the-counter or prescription medicines as directed by your caregiver.  Avoid drinking alcohol if it caused your condition.  Do not smoke.  Get plenty of rest.  Check your blood sugar at home as directed by your caregiver.  Keep all follow-up appointments as directed by your caregiver. SEEK MEDICAL CARE IF:   You do not recover as quickly as expected.  You develop new or worsening symptoms.  You have persistent pain, weakness, or nausea.  You recover and then have another episode of pain. SEEK IMMEDIATE MEDICAL CARE IF:   You are unable to eat or keep fluids down.  Your pain becomes severe.  You have a fever or persistent symptoms for more than 2 to 3 days.  You have a fever and your symptoms suddenly get worse.  Your skin or the white part of your eyes turn yellow (jaundice).  You develop vomiting.  You feel dizzy, or you faint.  Your blood sugar is high (over 300 mg/dL). MAKE SURE YOU:   Understand these instructions.  Will watch your condition.  Will get help right away if you are not doing well or get worse. Document Released: 03/07/2005 Document Revised: 09/06/2011 Document Reviewed: 06/16/2011 [facility] Patient Information 2015 [place], [place].  This information is not intended to replace advice given to you by your health care provider. Make sure you discuss any questions you have with your health care provider. ° °

## 2014-12-21 NOTE — ED Provider Notes (Signed)
[facility] Emergency Department Provider Note   ____________________________________________  Time seen:  I have reviewed the triage vital signs and the triage nursing note.  HISTORY  Chief Complaint Abdominal Pain and Chest Pain   Historian Patient  HPI Benjamin Obrien is a 39 y.o. male who is presenting for epigastric abdominal pain that goes through to his back since Thursday. He is tried antiacids without adequate relief. Pain does seem like it goes substernal slightly into his chest. He drinks a sixpack over a weekend of beer, but is not a daily drinker. He's had no shortness of breath, or coughing. He is actually stating that the abdominal pain does radiate down the rest of his abdomen as well. No vomiting or diarrhea. No black or bloody stools. Symptoms are moderate to severe.    History reviewed. No pertinent past medical history.  There are no active problems to display for this patient.   Past Surgical History  Procedure Laterality Date  . Tooth extraction      Current Outpatient Rx  Name  Route  Sig  Dispense  Refill  . ondansetron (ZOFRAN) 4 MG tablet   Oral   Take 1 tablet (4 mg total) by mouth every 8 (eight) hours as needed for nausea or vomiting.   10 tablet   0   . oxyCODONE-acetaminophen (ROXICET) 5-325 MG tablet   Oral   Take 1 tablet by mouth every 4 (four) hours as needed for severe pain.   10 tablet   0     Allergies Review of patient's allergies indicates no known allergies.  No family history on file.  Social History Social History  Substance Use Topics  . Smoking status: Current Every Day Smoker -- 0.50 packs/day    Types: Cigarettes  . Smokeless tobacco: None  . Alcohol Use: Yes     Comment: Saturdays    Review of Systems  Constitutional: Negative for fever. Eyes: Negative for visual changes. ENT: Negative for sore throat. Cardiovascular: Negative for palpitations. Respiratory: Negative for shortness  of breath. Gastrointestinal: Negative for vomiting and diarrhea. Genitourinary: Negative for dysuria. Musculoskeletal: Pain radiates through to the back at times.. Skin: Negative for rash. Neurological: Negative for headache. 10 point Review of Systems otherwise negative ____________________________________________   PHYSICAL EXAM:  VITAL SIGNS: ED Triage Vitals  Enc Vitals Group     BP --      Pulse --      Resp --      Temp --      Temp src --      SpO2 --      Weight 12/21/14 0949 160 lb (72.576 kg)     Height 12/21/14 0949  (1.727 m)     Head Cir --      Peak Flow --      Pain Score 12/21/14 0950 10     Pain Loc --      Pain Edu? --      Excl. in GC? --      Constitutional: Alert and oriented. Well appearing and overall and wincing a little bit in pain. Eyes: Conjunctivae are normal. PERRL. Normal extraocular movements. ENT   Head: Normocephalic and atraumatic.   Nose: No congestion/rhinnorhea.   Mouth/Throat: Mucous membranes are moist.   Neck: No stridor. Cardiovascular/Chest: Normal rate, regular rhythm.  No murmurs, rubs, or gallops. Respiratory: Normal respiratory effort without tachypnea nor retractions. Breath sounds are clear and equal bilaterally. No wheezes/rales/rhonchi. Gastrointestinal: Soft. No  distention, no guarding, no rebound. Moderate tenderness to palpation in the upper abdomen mostly in the epigastrium and left upper quadrant. Mild tenderness diffusely. Genitourinary/rectal:Deferred Musculoskeletal: Nontender with normal range of motion in all extremities. No joint effusions.  No lower extremity tenderness.  No edema. Neurologic:  Normal speech and language. No gross or focal neurologic deficits are appreciated. Skin:  Skin is warm, dry and intact. No rash noted. Psychiatric: Mood and affect are normal. Speech and behavior are normal. Patient exhibits appropriate insight and  judgment.  ____________________________________________   EKG I, Governor Rooks, MD, the attending physician have personally viewed and interpreted all ECGs.  85 beats minute. Normal sinus rhythm with sinus arrhythmia. Incomplete right bundle branch block. Nonspecific ST and T-wave. ____________________________________________  LABS (pertinent positives/negatives)  Lipase 69 Comprehensive metabolic panel without significant abnormality White blood count 11.3, hemoglobin 50.7 and platelet count 319 Troponin less than 0.03  ____________________________________________  RADIOLOGY All Xrays were viewed by me. Imaging interpreted by Radiologist.  CT abdomen and pelvis with contrast:  CLINICAL DATA: mid epigastric pain on Thursday, took antacids and pain continued. States he feels like he is having a heart attack, pain radiates through to his back, denies hx of cp.  EXAM: CT ABDOMEN AND PELVIS WITH CONTRAST  TECHNIQUE: Multidetector CT imaging of the abdomen and pelvis was performed using the standard protocol following bolus administration of intravenous contrast.  CONTRAST: OMNIPAQUE IOHEXOL 300 MG/ML SOLN  COMPARISON: None.  FINDINGS: Visualized lung bases clear. Unremarkable liver, nondilated gallbladder, spleen, adrenal glands, kidneys, pancreas. There are inflammatory/edematous changes inferior to the proximal duodenum, adjacent to the pancreatic head and inferior margin of the gallbladder. There is a focal low-attenuation region in the inferior wall of the second portion duodenum. No loculated extraluminal fluid collections. No free air. No ascites.  Stomach is physiologically distended. Small bowel and colon are nondilated. Urinary bladder incompletely distended. Mild scattered aortoiliac arterial calcifications. Portal vein and splenic vein are patent. No adenopathy localized. No hydronephrosis. Disc protrusions L3-S1.  IMPRESSION: 1. Focal  inflammatory/edematous process in the right upper abdomen, favor peptic ulcer disease over pancreatitis. Less likely cholecystitis given lack of gallbladder wall thickening. No evidence of perforation or abscess. __________________________________________  PROCEDURES  Procedure(s) performed: None  Critical Care performed: None  ____________________________________________   ED COURSE / ASSESSMENT AND PLAN  CONSULTATIONS: None  Pertinent labs & imaging results that were available during my care of the patient were reviewed by me and considered in my medical decision making (see chart for details).   Patient's lipase was elevated and consistent with pancreatitis and the location of his epigastric and left upper quadrant pain by history not exam. Because of the significant amount of pain, and the first finger tenderness, we discussed risks versus benefit of obtaining a CT and ensure decision-making chose to proceed.  No surgical process or additional competitions found on CT scan. Patient will be discharged home to follow up with [facility] clinic. Patient was discharged with symptomatic medications  Zofran and Roxicet.  Patient / Family / Caregiver informed of clinical course, medical decision-making process, and agree with plan.   I discussed return precautions, follow-up instructions, and discharged instructions with patient and/or family.  ___________________________________________   FINAL CLINICAL IMPRESSION(S) / ED DIAGNOSES   Final diagnoses:  Epigastric pain  Acute pancreatitis, unspecified complication status, unspecified pancreatitis type       Governor Rooks, MD 12/21/14 [phone]

## 2014-12-21 NOTE — ED Notes (Signed)
Pt states he began having mid sternal cp on Thursday, took antacids and pain continued. States he feels like he is having a heart attack, pain radiates through to his back, denies hx of cp.

## 2014-12-22 ENCOUNTER — Emergency Department
Admission: EM | Admit: 2014-12-22 | Discharge: 2014-12-22 | Discharge status: Against Medical Advice | Payer: Self-pay | Attending: Emergency Medicine | Admitting: Emergency Medicine

## 2014-12-22 DIAGNOSIS — K859 Acute pancreatitis without necrosis or infection, unspecified: Secondary | ICD-10-CM | POA: Insufficient documentation

## 2014-12-22 DIAGNOSIS — Z720 Tobacco use: Secondary | ICD-10-CM | POA: Insufficient documentation

## 2014-12-22 LAB — COMPREHENSIVE METABOLIC PANEL WITH GFR
ALT: 12 U/L — ABNORMAL LOW (ref 17–63)
AST: 17 U/L (ref 15–41)
Albumin: 3.6 g/dL (ref 3.5–5.0)
Alkaline Phosphatase: 51 U/L (ref 38–126)
Anion gap: 11 (ref 5–15)
BUN: 7 mg/dL (ref 6–20)
CO2: 28 mmol/L (ref 22–32)
Calcium: 9.1 mg/dL (ref 8.9–10.3)
Chloride: 97 mmol/L — ABNORMAL LOW (ref 101–111)
Creatinine, Ser: 0.86 mg/dL (ref 0.61–1.24)
GFR calc Af Amer: >60 mL/min
GFR calc non Af Amer: >60 mL/min
Glucose, Bld: 108 mg/dL — ABNORMAL HIGH (ref 65–99)
Potassium: 3.9 mmol/L (ref 3.5–5.1)
Sodium: 136 mmol/L (ref 135–145)
Total Bilirubin: 1.1 mg/dL (ref 0.3–1.2)
Total Protein: 6.8 g/dL (ref 6.5–8.1)

## 2014-12-22 LAB — CBC
HCT: 46 % (ref 40.0–52.0)
Hemoglobin: 15.7 g/dL (ref 13.0–18.0)
MCH: 31.9 pg (ref 26.0–34.0)
MCHC: 34 g/dL (ref 32.0–36.0)
MCV: 93.6 fL (ref 80.0–100.0)
Platelets: 297 10*3/uL (ref 150–440)
RBC: 4.91 MIL/uL (ref 4.40–5.90)
RDW: 12.6 % (ref 11.5–14.5)
WBC: 15.5 10*3/uL — ABNORMAL HIGH (ref 3.8–10.6)

## 2014-12-22 LAB — LIPASE, BLOOD: Lipase: 38 U/L (ref 22–51)

## 2014-12-22 NOTE — ED Notes (Signed)
Pt states he has had nausea with abd pain since Thursday and was seen by Dr. Shaune Pollack yesterday and told he has pancreatitis, states told to come back if sx persisted or worsened, per pt.Benjamin Obrien

## 2015-01-28 ENCOUNTER — Encounter: Payer: Self-pay | Admitting: Emergency Medicine

## 2015-01-28 ENCOUNTER — Emergency Department: Payer: Self-pay

## 2015-01-28 ENCOUNTER — Emergency Department
Admission: EM | Admit: 2015-01-28 | Discharge: 2015-01-28 | Disposition: A | Discharge status: Home / Self Care | Payer: Self-pay | Attending: Emergency Medicine | Admitting: Emergency Medicine

## 2015-01-28 DIAGNOSIS — K859 Acute pancreatitis without necrosis or infection, unspecified: Secondary | ICD-10-CM | POA: Insufficient documentation

## 2015-01-28 DIAGNOSIS — R1013 Epigastric pain: Secondary | ICD-10-CM

## 2015-01-28 DIAGNOSIS — Z720 Tobacco use: Secondary | ICD-10-CM | POA: Insufficient documentation

## 2015-01-28 HISTORY — DX: Acute pancreatitis without necrosis or infection, unspecified: K85.90

## 2015-01-28 LAB — URINALYSIS COMPLETE WITH MICROSCOPIC (ARMC ONLY)
Bacteria, UA: NONE SEEN
Bilirubin Urine: NEGATIVE
Glucose, UA: NEGATIVE mg/dL
Ketones, ur: NEGATIVE mg/dL
Leukocytes, UA: NEGATIVE
Nitrite: NEGATIVE
Protein, ur: NEGATIVE mg/dL
Specific Gravity, Urine: 1.019 (ref 1.005–1.030)
Squamous Epithelial / LPF: NONE SEEN
pH: 5 (ref 5.0–8.0)

## 2015-01-28 LAB — COMPREHENSIVE METABOLIC PANEL
ALT: 14 U/L — ABNORMAL LOW (ref 17–63)
AST: 17 U/L (ref 15–41)
Albumin: 3.9 g/dL (ref 3.5–5.0)
Alkaline Phosphatase: 57 U/L (ref 38–126)
Anion gap: 6 (ref 5–15)
BUN: 5 mg/dL — ABNORMAL LOW (ref 6–20)
CO2: 26 mmol/L (ref 22–32)
Calcium: 9.5 mg/dL (ref 8.9–10.3)
Chloride: 103 mmol/L (ref 101–111)
Creatinine, Ser: 0.82 mg/dL (ref 0.61–1.24)
GFR calc Af Amer: >60 mL/min (ref >60)
GFR calc non Af Amer: >60 mL/min (ref >60)
Glucose, Bld: 125 mg/dL — ABNORMAL HIGH (ref 65–99)
Potassium: 4.1 mmol/L (ref 3.5–5.1)
Sodium: 135 mmol/L (ref 135–145)
Total Bilirubin: 0.9 mg/dL (ref 0.3–1.2)
Total Protein: 7 g/dL (ref 6.5–8.1)

## 2015-01-28 LAB — CBC
HCT: 51.8 % (ref 40.0–52.0)
Hemoglobin: 16.9 g/dL (ref 13.0–18.0)
MCH: 30.5 pg (ref 26.0–34.0)
MCHC: 32.7 g/dL (ref 32.0–36.0)
MCV: 93.3 fL (ref 80.0–100.0)
Platelets: 414 10*3/uL (ref 150–440)
RBC: 5.55 MIL/uL (ref 4.40–5.90)
RDW: 13.1 % (ref 11.5–14.5)
WBC: 12.3 10*3/uL — ABNORMAL HIGH (ref 3.8–10.6)

## 2015-01-28 LAB — LIPASE, BLOOD: Lipase: 40 U/L (ref 11–51)

## 2015-01-28 LAB — TROPONIN I: Troponin I: <0.03 ng/mL (ref <0.031)

## 2015-01-28 IMAGING — CT CT ABD-PELV W/ CM
2 of 5 series · 14 of 46 positions shown, 16 images · IV contrast (omnipaque)
Comparison: [DATE]

CLINICAL DATA: Severe abdominal pain, of acute onset

EXAM:
CT ABDOMEN AND PELVIS WITH CONTRAST
TECHNIQUE: Multidetector CT imaging of the abdomen and pelvis was performed
using the standard protocol following bolus administration of
intravenous contrast. Oral contrast also administered.
CONTRAST:  100mL OMNIPAQUE IOHEXOL 300 MG/ML  SOLN

[Series 2: routine abd pel with · axial · 0.70mm/px · z∈[-369,+6]mm · 11 of 85 slices shown, 13 images]
[im 5/85  soft-tissue]
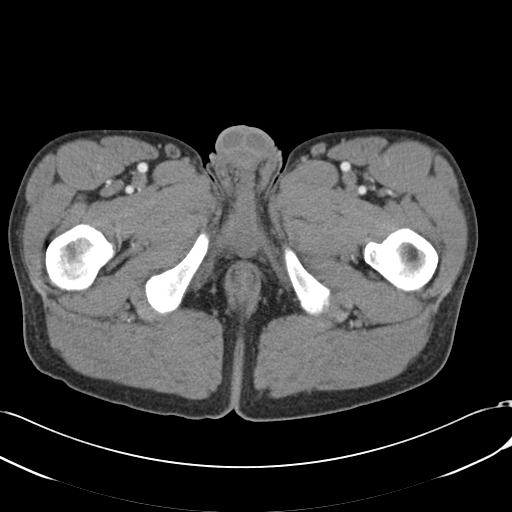
[im 5/85  bone]
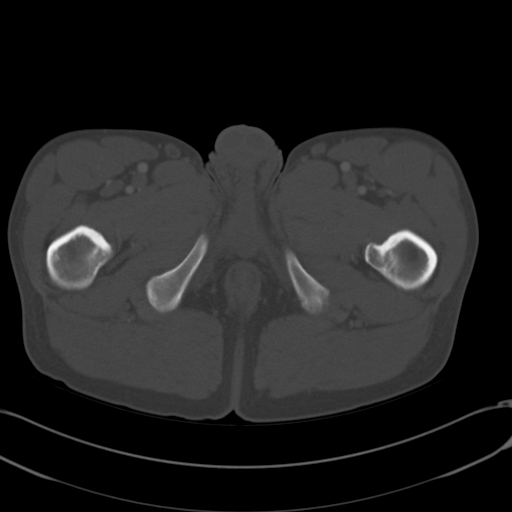
[im 13/85  soft-tissue]
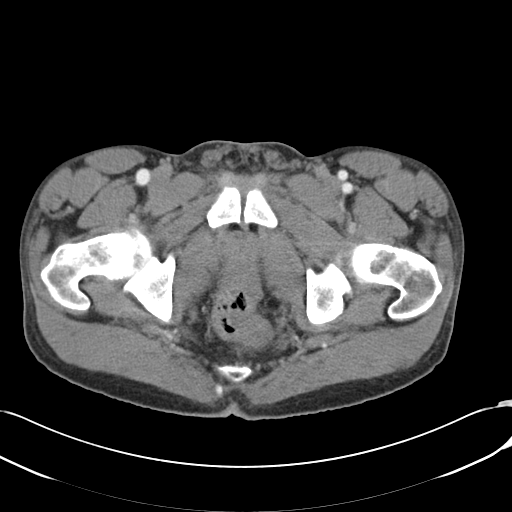
[im 22/85  soft-tissue]
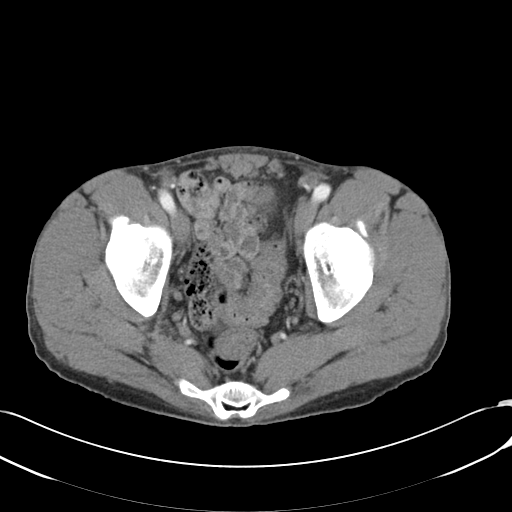
[im 30/85  soft-tissue]
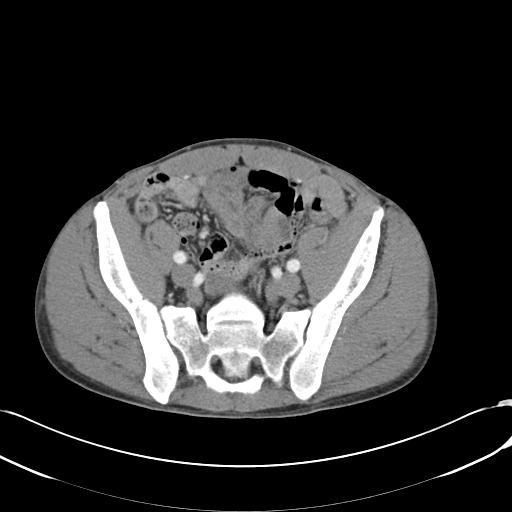
[im 34/85  soft-tissue]
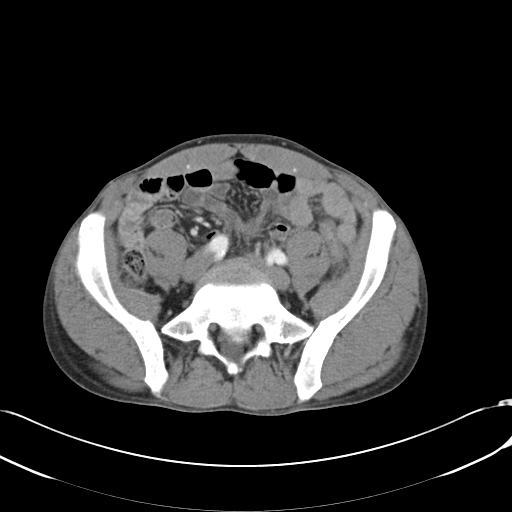
[im 43/85  soft-tissue]
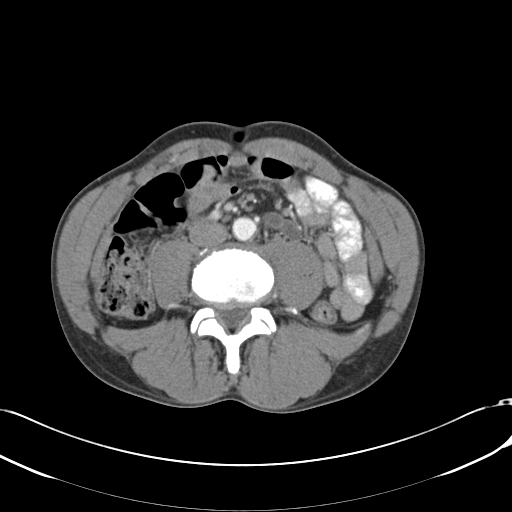
[im 51/85  soft-tissue]
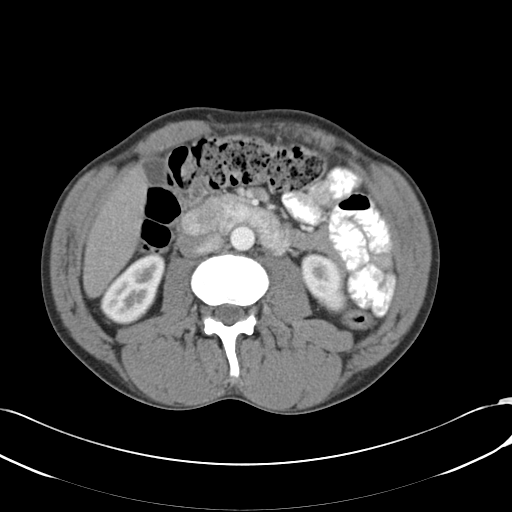
[im 55/85  soft-tissue]
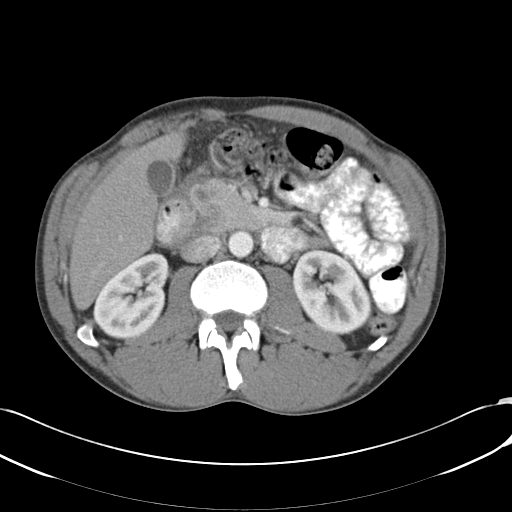
[im 64/85  soft-tissue]
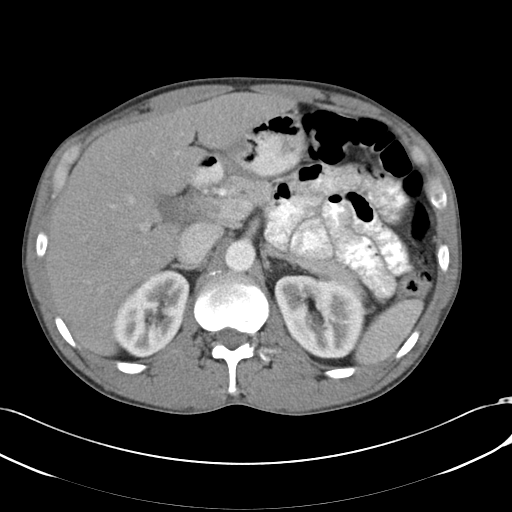
[im 64/85  bone]
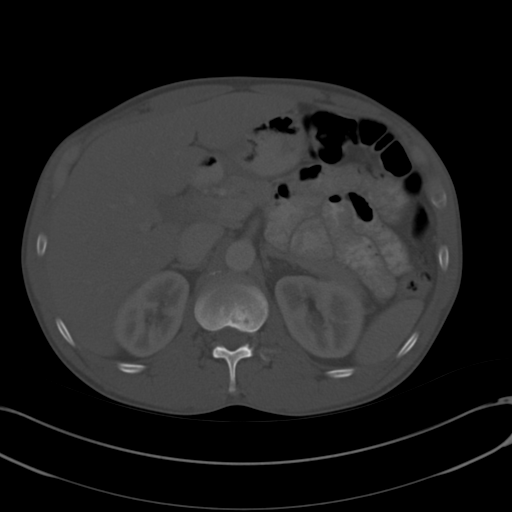
[im 72/85  soft-tissue]
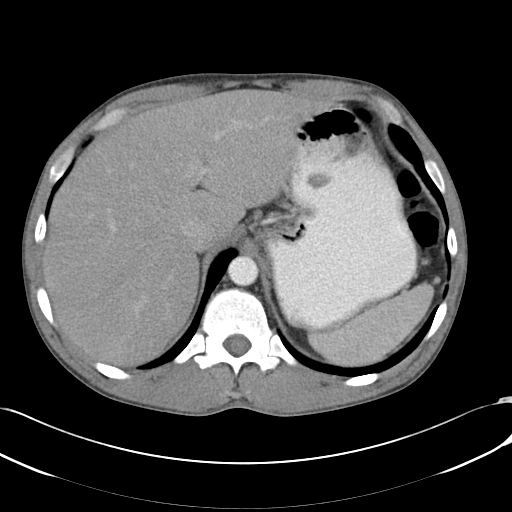
[im 80/85  soft-tissue]
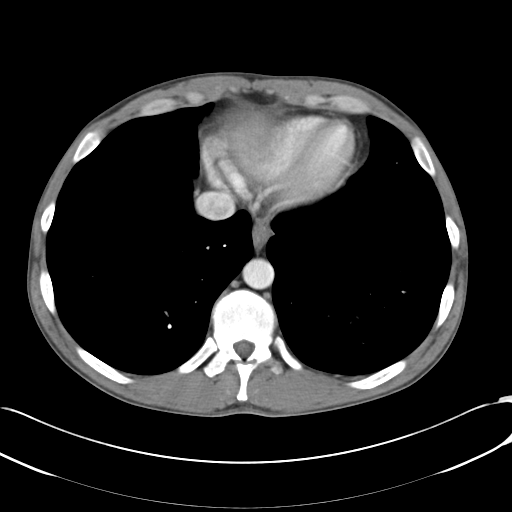

[Series 5: cor routine abd pel with · coronal · 0.59mm/px · 3 of 122 slices shown]
[im 41/122  soft-tissue]
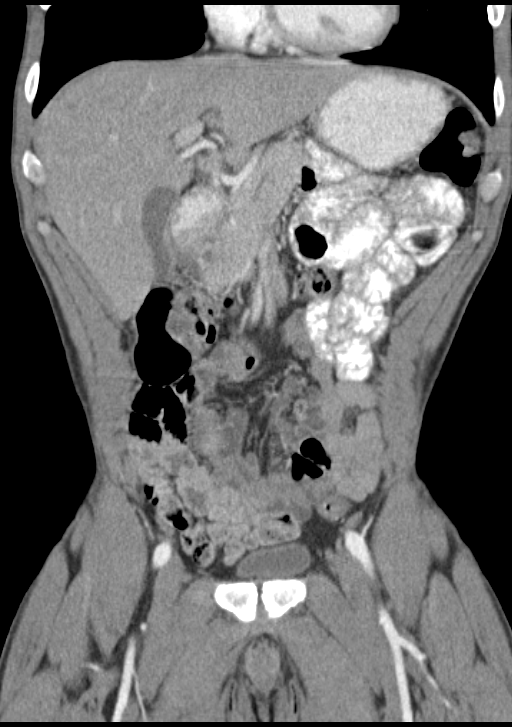
[im 54/122  soft-tissue]
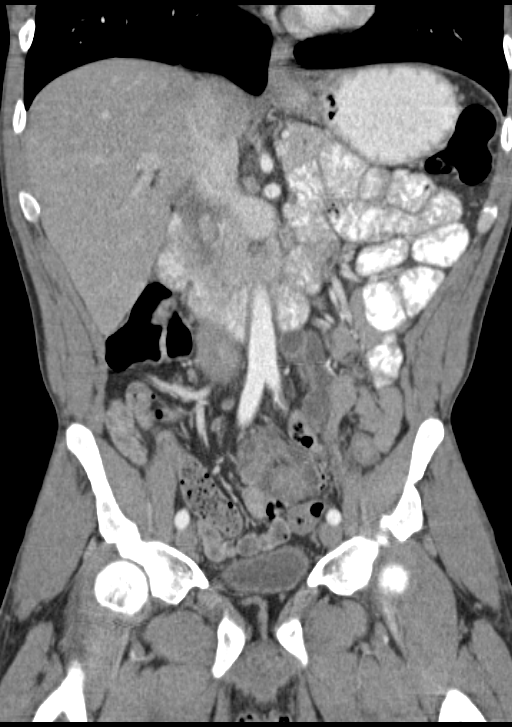
[im 68/122  soft-tissue]
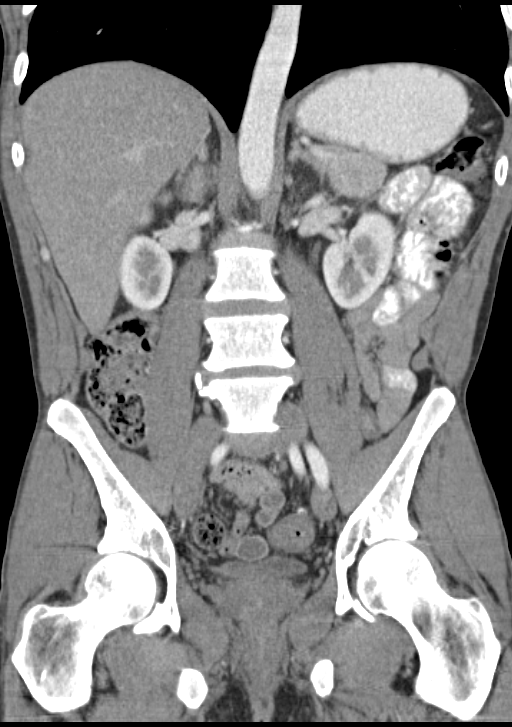

[14 of 46 positions shown; findings below may reference images not displayed]

FINDINGS: Lower chest:  Lung bases are clear.

Hepatobiliary: There is equivocal hepatic steatosis. No focal liver
lesions are identified. Gallbladder wall is not thickened. There is
no biliary duct dilatation.

Pancreas: There is inflammation immediately adjacent to the head of
the pancreas. There is adjacent inflammation of the proximal
duodenum. It is difficult to ascertain whether this inflammation is
arising as part of pancreatitis involving the pancreatic head versus
peptic ulcer disease from the proximal duodenum causing adjacent
pancreatic inflammation. This appearance is similar to findings on
prior study. No well-defined pancreatic mass is identified. The
remainder the pancreas appears normal.

Spleen: No splenic lesions are identified.

Adrenals/Urinary Tract: Adrenals appear normal bilaterally. Kidneys
bilaterally show no mass or hydronephrosis on either side. There is
no renal or ureteral calculus on either side. Urinary bladder is
midline with wall thickness within normal limits.

Stomach/Bowel: There is wall thickening in the proximal duodenum
with surrounding mesenteric inflammation which extends to the level
of the pancreatic head. There is no well-defined ulceration by CT in
this area of wall thickening in the proximal duodenum, and there is
no fistula or microperforation appreciable. No other bowel wall
thickening is identified. No bowel obstruction. No free air or
portal venous air. There are occasional sigmoid diverticula without
diverticulitis.

Vascular/Lymphatic: There are scattered foci of atherosclerotic
calcification in both common iliac arteries. No abdominal aortic
aneurysm is seen. Major mesenteric arterial vessels appear patent.
There is no appreciable adenopathy in the abdomen or pelvis.

Reproductive: Prostate is normal in size and contour. There is no
pelvic mass or pelvic fluid collection.

Other: The appendix appears normal. No abscess or ascites is seen in
the abdomen or pelvis.

Musculoskeletal: There is mild degenerative change in the lumbar
spine. There are no blastic or lytic bone lesions. There is no
intramuscular abdominal wall lesion.
IMPRESSION: Inflammatory change involving the second portion of the duodenum and
pancreatic head. It is difficult to ascertain whether these changes
are due to peptic ulcer disease versus pancreatitis. Both entities
may exist concurrently. The appearance is similar to 1 month prior.
Given the appearance of the proximal duodenum, direct visualization
may well be warranted. Appropriate laboratory studies for potential
pancreatitis advised as well.

No bowel obstruction. No abscess. Appendix appears normal. No renal
or ureteral calculus. No hydronephrosis.

## 2015-01-28 MED ORDER — IOHEXOL 240 MG/ML SOLN
25.0000 mL | Freq: Once | INTRAMUSCULAR | Status: AC | PRN
  Administered 2015-01-28: 25 mL via INTRAVENOUS
  Filled 2015-01-28: qty 25

## 2015-01-28 MED ORDER — PANTOPRAZOLE SODIUM 40 MG PO TBEC: 40.0000 mg | DELAYED_RELEASE_TABLET | Freq: Every day | ORAL | Status: DC

## 2015-01-28 MED ORDER — HYDROMORPHONE HCL 1 MG/ML IJ SOLN
INTRAMUSCULAR | Status: AC
  Administered 2015-01-28: 1 mg via INTRAMUSCULAR
  Filled 2015-01-28: qty 1

## 2015-01-28 MED ORDER — OXYCODONE-ACETAMINOPHEN 5-325 MG PO TABS: 1.0000 | ORAL_TABLET | Freq: Four times a day (QID) | ORAL | Status: DC | PRN

## 2015-01-28 MED ORDER — HYDROMORPHONE HCL 1 MG/ML IJ SOLN
1.0000 mg | Freq: Once | INTRAMUSCULAR | Status: AC
  Administered 2015-01-28: 1 mg via INTRAMUSCULAR

## 2015-01-28 MED ORDER — HYDROMORPHONE HCL 1 MG/ML IJ SOLN: 1.0000 mg | Freq: Once | INTRAMUSCULAR | Status: DC

## 2015-01-28 MED ORDER — IOHEXOL 300 MG/ML  SOLN
100.0000 mL | Freq: Once | INTRAMUSCULAR | Status: AC | PRN
  Administered 2015-01-28: 100 mL via INTRAVENOUS
  Filled 2015-01-28: qty 100

## 2015-01-28 MED ORDER — OXYCODONE-ACETAMINOPHEN 5-325 MG PO TABS
ORAL_TABLET | ORAL | Status: AC
  Administered 2015-01-28: 2 via ORAL
  Filled 2015-01-28: qty 2

## 2015-01-28 MED ORDER — GI COCKTAIL ~~LOC~~
30.0000 mL | Freq: Once | ORAL | Status: AC
  Administered 2015-01-28: 30 mL via ORAL
  Filled 2015-01-28: qty 30

## 2015-01-28 MED ORDER — OXYCODONE-ACETAMINOPHEN 5-325 MG PO TABS
2.0000 | ORAL_TABLET | Freq: Once | ORAL | Status: AC
  Administered 2015-01-28: 2 via ORAL

## 2015-01-28 NOTE — ED Notes (Signed)
Pt presents with abd pain started last night. Pt dx with pancreatitis back in September, pt c/o worse pain than before.

## 2015-01-28 NOTE — ED Provider Notes (Signed)
[facility] Emergency Department Provider Note  Time seen: 2:43 PM  I have reviewed the triage vital signs and the nursing notes.   HISTORY  Chief Complaint Abdominal Pain    HPI Benjamin Obrien is a 39 y.o. male with a past medical history of pancreatitis who presents the emergency department with upper abdominal pain. According to the patient his abdominal pain began yesterday, it has progressively worsened over night and into today. States it feels exactly like his pancreatitis felt one month ago. He states since being diagnosed with pancreatitis 1 month ago he no longer drinks alcohol regularly, he states he will have one beer per week. He does note extensive use of Aleve, several times per day. Denies any black or bloody vomit, but the patient is nauseated with occasional vomiting. Denies diarrhea, black or bloody stool. Describes the epigastric abdominal pain as moderate to severe. States it feels like a burning sensation.     Past Medical History  Diagnosis Date  . Pancreatitis     There are no active problems to display for this patient.   Past Surgical History  Procedure Laterality Date  . Tooth extraction      Current Outpatient Rx  Name  Route  Sig  Dispense  Refill  . ondansetron (ZOFRAN) 4 MG tablet   Oral   Take 1 tablet (4 mg total) by mouth every 8 (eight) hours as needed for nausea or vomiting.   10 tablet   0   . oxyCODONE-acetaminophen (ROXICET) 5-325 MG tablet   Oral   Take 1 tablet by mouth every 4 (four) hours as needed for severe pain.   10 tablet   0     Allergies Review of patient's allergies indicates no known allergies.  No family history on file.  Social History Social History  Substance Use Topics  . Smoking status: Current Every Day Smoker -- 0.50 packs/day    Types: Cigarettes  . Smokeless tobacco: None  . Alcohol Use: Yes     Comment: Saturdays    Review of Systems Constitutional: Negative for  fever Cardiovascular: Negative for chest pain. Respiratory: Negative for shortness of breath. Gastrointestinal: As it for upper abdominal pain, nausea, vomiting. Negative for diarrhea or constipation. Genitourinary: Negative for dysuria. Musculoskeletal: States the pain seems to shoot to his back. Neurological: Negative for headache 10-point ROS otherwise negative.  ____________________________________________   PHYSICAL EXAM:  VITAL SIGNS: ED Triage Vitals  Enc Vitals Group     BP 01/28/15 1157 134/90 mmHg     Pulse Rate 01/28/15 1157 98     Resp 01/28/15 1157 20     Temp 01/28/15 1157 98.7 F (37.1 C)     Temp Source 01/28/15 1157 Oral     SpO2 01/28/15 1157 96 %     Weight 01/28/15 1157 147 lb (66.679 kg)     Height 01/28/15 1157  (1.727 m)     Head Cir --      Peak Flow --      Pain Score 01/28/15 1158 10     Pain Loc --      Pain Edu? --      Excl. in GC? --     Constitutional: Alert and oriented. Well appearing and in no distress. Eyes: Normal exam ENT   Head: Normocephalic and atraumatic.   Mouth/Throat: Mucous membranes are moist. Cardiovascular: Normal rate, regular rhythm. No murmur Respiratory: Normal respiratory effort without tachypnea nor retractions. Breath sounds are  clear and equal bilaterally. No wheezes/rales/rhonchi. Gastrointestinal: Moderate epigastric tenderness palpation. No rebound or guarding. No distention. Musculoskeletal: Nontender with normal range of motion in all extremities.  Neurologic:  Normal speech and language. No gross focal neurologic deficits Skin:  Skin is warm, dry and intact.  Psychiatric: Mood and affect are normal. Speech and behavior are normal.   ____________________________________________    EKG  EKG reviewed and interpreted, so shows normal sinus rhythm at 79 bpm, narrow QRS, normal axis, normal intervals, nonspecific ST changes present. No ST elevations  noted.  ____________________________________________    RADIOLOGY  CT scan consistent with possible pancreatitis versus peptic ulcer disease.  ____________________________________________   INITIAL IMPRESSION / ASSESSMENT AND PLAN / ED COURSE  Pertinent labs & imaging results that were available during my care of the patient were reviewed by me and considered in my medical decision making (see chart for details).  Patient presents the emergency department with upper abdominal pain in his epigastrium. Labs show a mild leukocytosis, lipase is approaching the upper limit of normal but is not elevated. Patient states this feels identical to his past episode of pancreatitis. We will treat with IM pain medication, and a GI cocktail monitor for improvement. Given his extensive use of Aleve the patient could be experiencing gastritis. Patient doesn't moderate epigastric tenderness palpation, but otherwise a benign abdominal exam. We'll monitor for symptom improvement. If the patient does not improve we will proceed with a CT abdomen/pelvis to further evaluate.  Patient states continued upper abdominal pain. We'll proceed with a CT scan. The patient has upper and left upper abdominal pain, no right upper quadrant abdominal pain along with normal LFTs do not suspect gallbladder disease.  CT consistent with pancreatitis versus peptic ulcer disease. I discussed with the patient to stop the use of NSAIDs, we'll prescribe Protonix, and a course of Percocet going home. Patient is to avoid all alcohol and NSAIDs, and follow up with GI medicine. Patient is agreeable to plan and will call the number provided for GI medicine follow-up. I discussed my normal abdominal pain return precautions with the patient.  ____________________________________________   FINAL CLINICAL IMPRESSION(S) / ED DIAGNOSES  Epigastric pain Pancreatitis  Minna AntisKevin Dontre Laduca, MD 01/28/15 1711

## 2015-01-28 NOTE — Discharge Instructions (Signed)
As we discussed please avoid the use of all alcohol, and all NSAIDs (such as ibuprofen, Aleve, Advil, Motrin, aspirin). Tylenol is safe to use. Please avoid fatty or greasy foods. Please follow-up with GI medicine by calling the number provided as soon as possible. Return to the emergency department for any worsening pain, fever, or any other symptoms personally concerning to your self.    Acute Pancreatitis Acute pancreatitis is a disease in which the pancreas becomes suddenly inflamed. The pancreas is a large gland located behind your stomach. The pancreas produces enzymes that help digest food. The pancreas also releases the hormones glucagon and insulin that help regulate blood sugar. Damage to the pancreas occurs when the digestive enzymes from the pancreas are activated and begin attacking the pancreas before being released into the intestine. Most acute attacks last a couple of days and can cause serious complications. Some people become dehydrated and develop low blood pressure. In severe cases, bleeding into the pancreas can lead to shock and can be life-threatening. The lungs, heart, and kidneys may fail. CAUSES  Pancreatitis can happen to anyone. In some cases, the cause is unknown. Most cases are caused by:  Alcohol abuse.  Gallstones. Other less common causes are:  Certain medicines.  Exposure to certain chemicals.  Infection.  Damage caused by an accident (trauma).  Abdominal surgery. SYMPTOMS   Pain in the upper abdomen that may radiate to the back.  Tenderness and swelling of the abdomen.  Nausea and vomiting. DIAGNOSIS  Your caregiver will perform a physical exam. Blood and stool tests may be done to confirm the diagnosis. Imaging tests may also be done, such as X-rays, CT scans, or an ultrasound of the abdomen. TREATMENT  Treatment usually requires a stay in the hospital. Treatment may include:  Pain medicine.  Fluid replacement through an intravenous line  (IV).  Placing a tube in the stomach to remove stomach contents and control vomiting.  Not eating for 3 or 4 days. This gives your pancreas a rest, because enzymes are not being produced that can cause further damage.  Antibiotic medicines if your condition is caused by an infection.  Surgery of the pancreas or gallbladder. HOME CARE INSTRUCTIONS   Follow the diet advised by your caregiver. This may involve avoiding alcohol and decreasing the amount of fat in your diet.  Eat smaller, more frequent meals. This reduces the amount of digestive juices the pancreas produces.  Drink enough fluids to keep your urine clear or pale yellow.  Only take over-the-counter or prescription medicines as directed by your caregiver.  Avoid drinking alcohol if it caused your condition.  Do not smoke.  Get plenty of rest.  Check your blood sugar at home as directed by your caregiver.  Keep all follow-up appointments as directed by your caregiver. SEEK MEDICAL CARE IF:   You do not recover as quickly as expected.  You develop new or worsening symptoms.  You have persistent pain, weakness, or nausea.  You recover and then have another episode of pain. SEEK IMMEDIATE MEDICAL CARE IF:   You are unable to eat or keep fluids down.  Your pain becomes severe.  You have a fever or persistent symptoms for more than 2 to 3 days.  You have a fever and your symptoms suddenly get worse.  Your skin or the white part of your eyes turn yellow (jaundice).  You develop vomiting.  You feel dizzy, or you faint.  Your blood sugar is high (over 300 mg/dL).  MAKE SURE YOU:   Understand these instructions.  Will watch your condition.  Will get help right away if you are not doing well or get worse.   This information is not intended to replace advice given to you by your health care provider. Make sure you discuss any questions you have with your health care provider.   Document Released: 03/07/2005  Document Revised: 09/06/2011 Document Reviewed: 06/16/2011 Elsevier Interactive Patient Education [employer]2016 [employer]

## 2015-01-28 NOTE — ED Notes (Signed)
States he is having chest  And abd pain which radiating into back

## 2017-08-23 ENCOUNTER — Other Ambulatory Visit: Payer: Self-pay

## 2017-08-23 ENCOUNTER — Emergency Department: Payer: Self-pay

## 2017-08-23 ENCOUNTER — Inpatient Hospital Stay
Admission: EM | Admit: 2017-08-23 | Discharge: 2017-08-25 | DRG: 482 | Discharge status: Against Medical Advice | Payer: Self-pay | Attending: Internal Medicine | Admitting: Internal Medicine

## 2017-08-23 DIAGNOSIS — S72142A Displaced intertrochanteric fracture of left femur, initial encounter for closed fracture: Principal | ICD-10-CM | POA: Diagnosis present

## 2017-08-23 DIAGNOSIS — F17210 Nicotine dependence, cigarettes, uncomplicated: Secondary | ICD-10-CM | POA: Diagnosis present

## 2017-08-23 DIAGNOSIS — T148XXA Other injury of unspecified body region, initial encounter: Secondary | ICD-10-CM

## 2017-08-23 DIAGNOSIS — W109XXA Fall (on) (from) unspecified stairs and steps, initial encounter: Secondary | ICD-10-CM | POA: Diagnosis present

## 2017-08-23 DIAGNOSIS — S72002A Fracture of unspecified part of neck of left femur, initial encounter for closed fracture: Secondary | ICD-10-CM

## 2017-08-23 DIAGNOSIS — W208XXA Other cause of strike by thrown, projected or falling object, initial encounter: Secondary | ICD-10-CM | POA: Diagnosis present

## 2017-08-23 DIAGNOSIS — R918 Other nonspecific abnormal finding of lung field: Secondary | ICD-10-CM | POA: Diagnosis present

## 2017-08-23 DIAGNOSIS — S72009A Fracture of unspecified part of neck of unspecified femur, initial encounter for closed fracture: Secondary | ICD-10-CM | POA: Diagnosis present

## 2017-08-23 DIAGNOSIS — Y92009 Unspecified place in unspecified non-institutional (private) residence as the place of occurrence of the external cause: Secondary | ICD-10-CM

## 2017-08-23 LAB — CBC
HCT: 40 % (ref 40.0–52.0)
Hemoglobin: 13.7 g/dL (ref 13.0–18.0)
MCH: 31.8 pg (ref 26.0–34.0)
MCHC: 34.3 g/dL (ref 32.0–36.0)
MCV: 92.8 fL (ref 80.0–100.0)
Platelets: 378 10*3/uL (ref 150–440)
RBC: 4.31 MIL/uL — ABNORMAL LOW (ref 4.40–5.90)
RDW: 13.1 % (ref 11.5–14.5)
WBC: 16.7 10*3/uL — ABNORMAL HIGH (ref 3.8–10.6)

## 2017-08-23 LAB — COMPREHENSIVE METABOLIC PANEL
ALT: 25 U/L (ref 17–63)
AST: 39 U/L (ref 15–41)
Albumin: 3.6 g/dL (ref 3.5–5.0)
Alkaline Phosphatase: 41 U/L (ref 38–126)
Anion gap: 13 (ref 5–15)
BUN: 12 mg/dL (ref 6–20)
CO2: 19 mmol/L — ABNORMAL LOW (ref 22–32)
Calcium: 8.2 mg/dL — ABNORMAL LOW (ref 8.9–10.3)
Chloride: 101 mmol/L (ref 101–111)
Creatinine, Ser: 0.91 mg/dL (ref 0.61–1.24)
GFR calc Af Amer: >60 mL/min (ref >60)
GFR calc non Af Amer: >60 mL/min (ref >60)
Glucose, Bld: 141 mg/dL — ABNORMAL HIGH (ref 65–99)
Potassium: 4.1 mmol/L (ref 3.5–5.1)
Sodium: 133 mmol/L — ABNORMAL LOW (ref 135–145)
Total Bilirubin: 0.5 mg/dL (ref 0.3–1.2)
Total Protein: 6.2 g/dL — ABNORMAL LOW (ref 6.5–8.1)

## 2017-08-23 LAB — PROTIME-INR
INR: 0.96
Prothrombin Time: 12.7 seconds (ref 11.4–15.2)

## 2017-08-23 IMAGING — CT CT CERVICAL SPINE W/O CM
4 of 7 series · 14 of 33 positions shown, 15 images · non-contrast
Comparison: None.

CLINICAL DATA: Fall down stairs while carrying water heater, water
heater fell on patient.

EXAM:
CT HEAD WITHOUT CONTRAST
CT CERVICAL SPINE WITHOUT CONTRAST
TECHNIQUE: Multidetector CT imaging of the head and cervical spine was
performed following the standard protocol without intravenous
contrast. Multiplanar CT image reconstructions of the cervical spine
were also generated.

[Series 3: sagittal bone · sagittal · 0.24mm/px · 4 of 48 slices shown]
[im 10/48  bone]
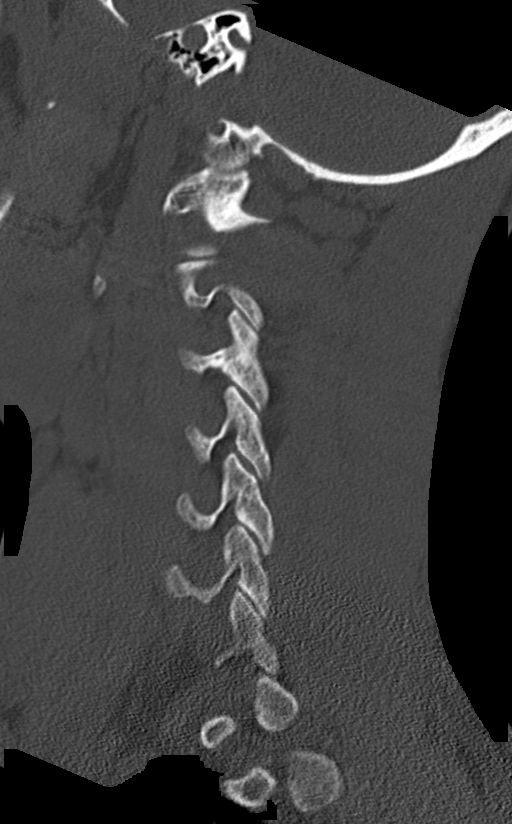
[im 19/48  bone]
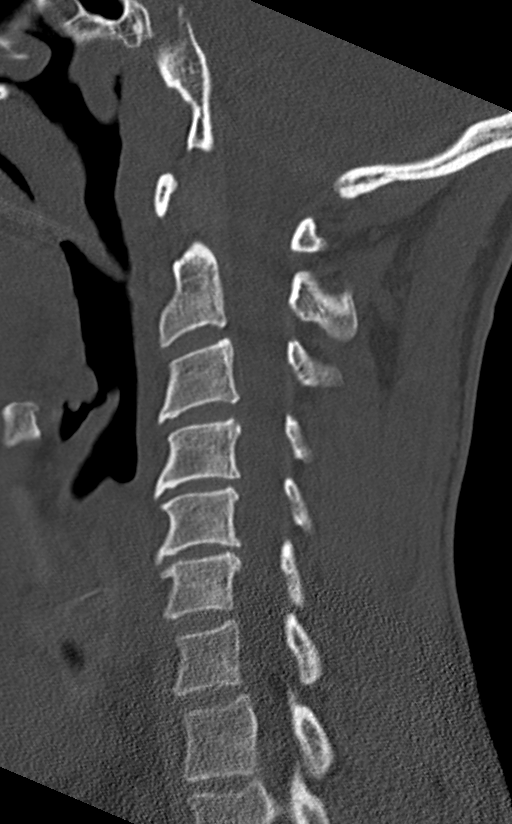
[im 29/48  bone]
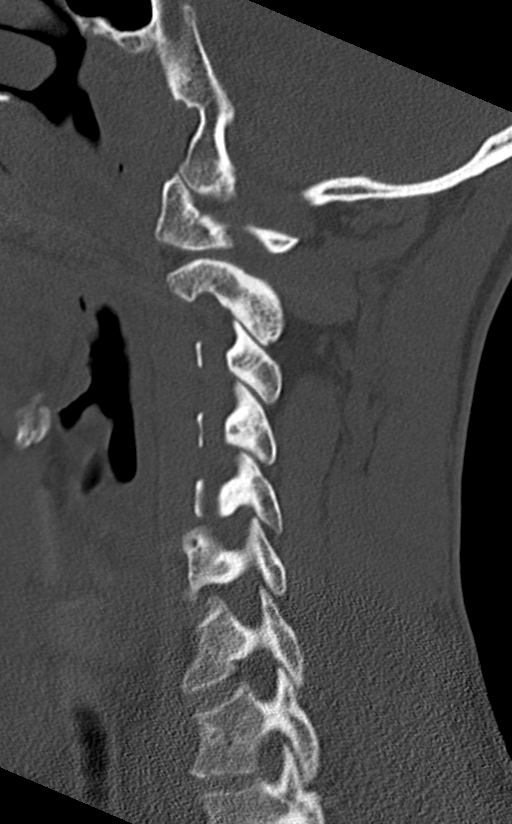
[im 38/48  bone]
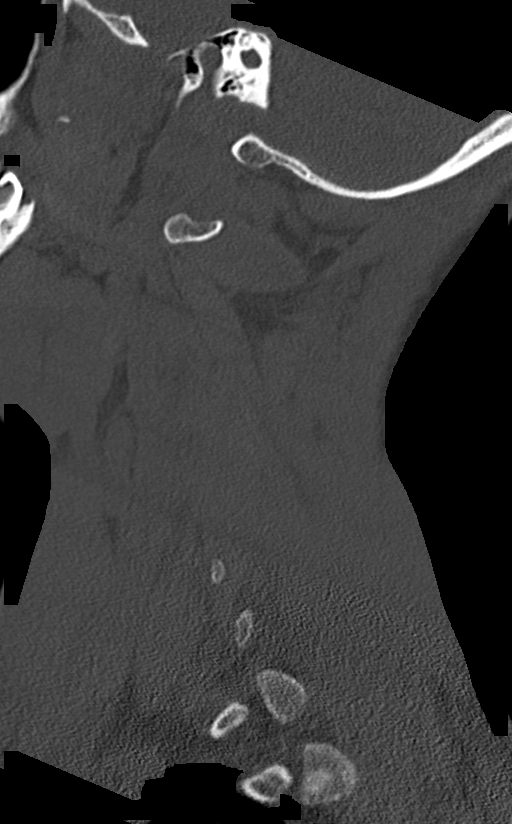

[Series 6: coronal soft tissue · coronal · 0.29mm/px · 2 of 66 slices shown]
[im 22/66  bone]
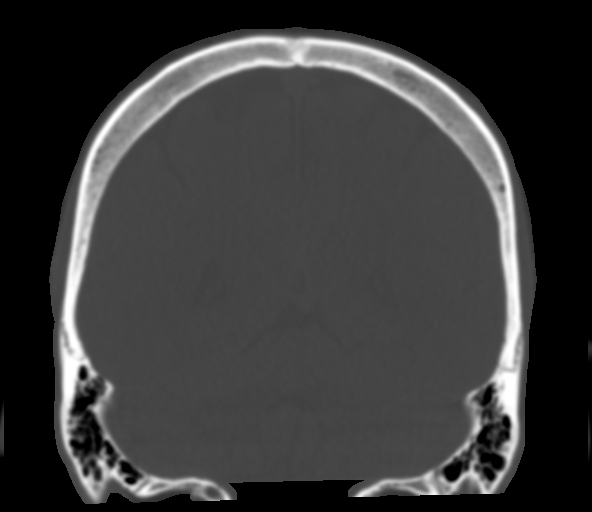
[im 44/66  bone]
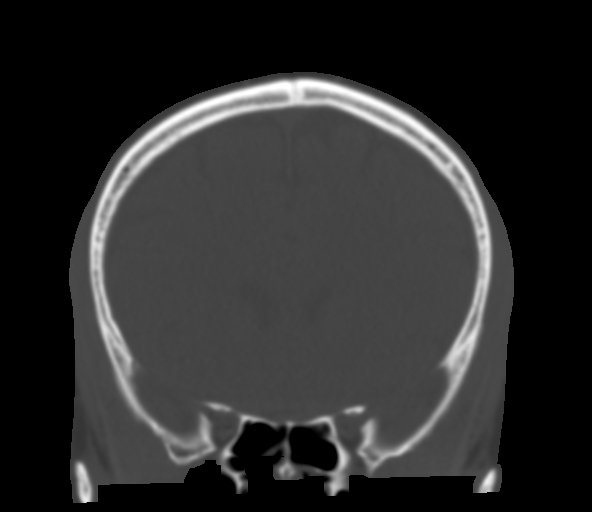

[Series 8: c spine soft · axial · 0.34mm/px · z∈[-224,-110]mm · 4 of 97 slices shown]
[im 20/97  soft-tissue]
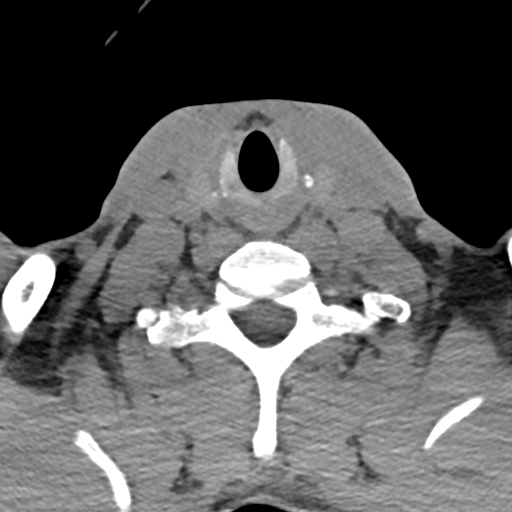
[im 39/97  soft-tissue]
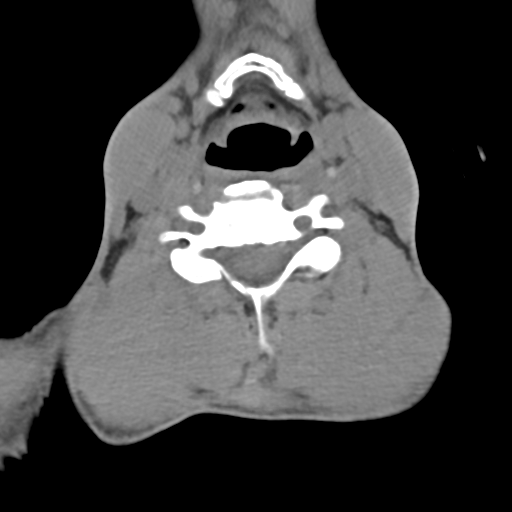
[im 58/97  soft-tissue]
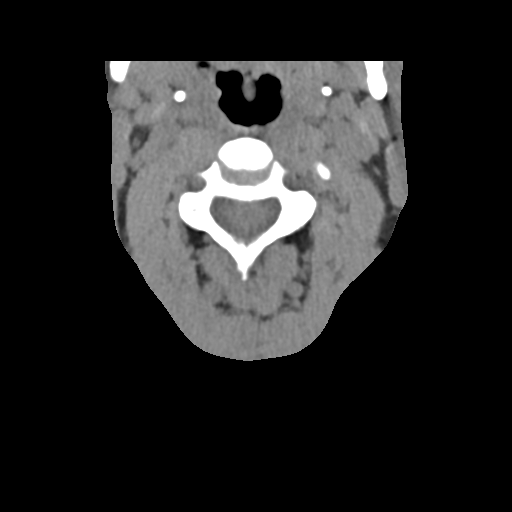
[im 77/97  soft-tissue]
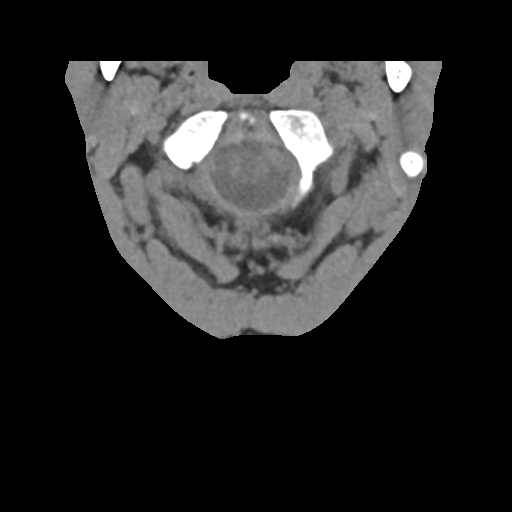

[Series 10: orthogonal bone · axial · 0.22mm/px · z∈[-243,-134]mm · 4 of 100 slices shown, 5 images]
[im 20/100  soft-tissue]
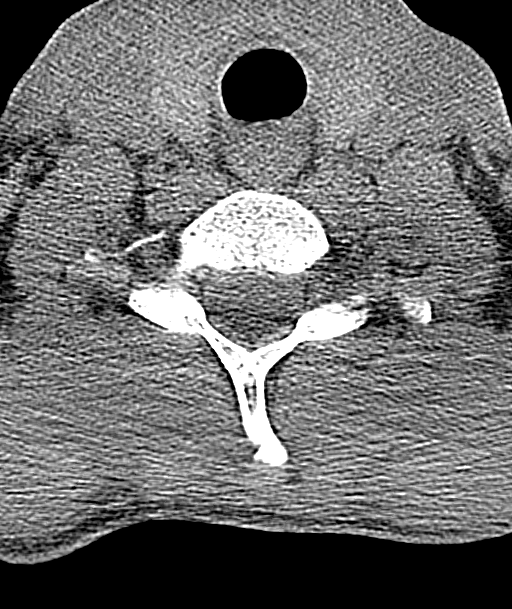
[im 20/100  bone]
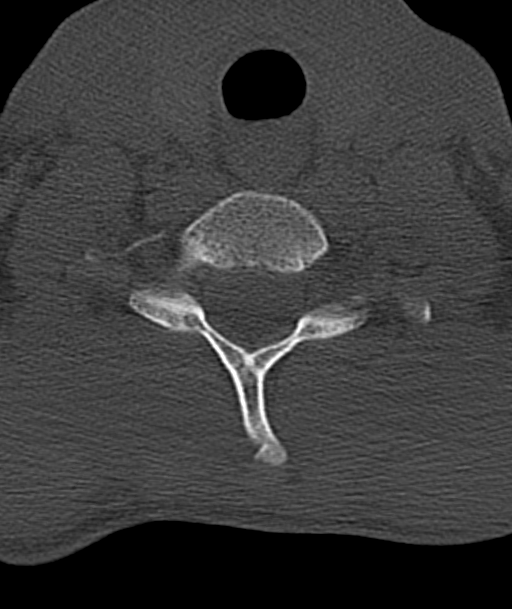
[im 40/100  bone]
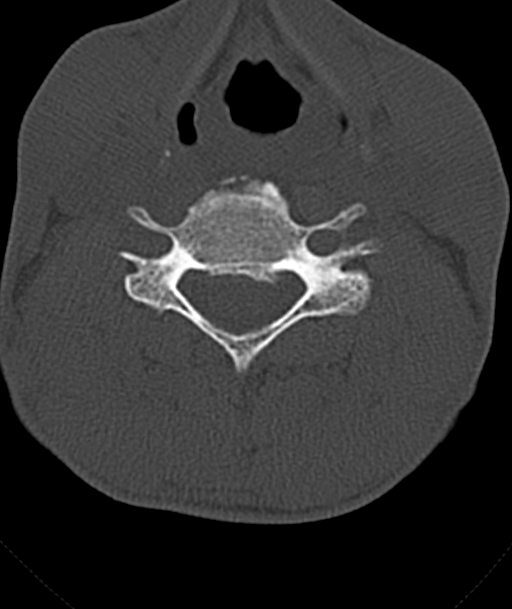
[im 60/100  bone]
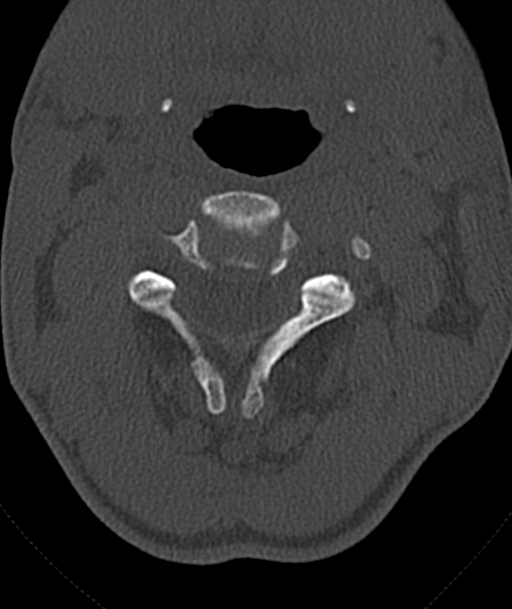
[im 80/100  bone]
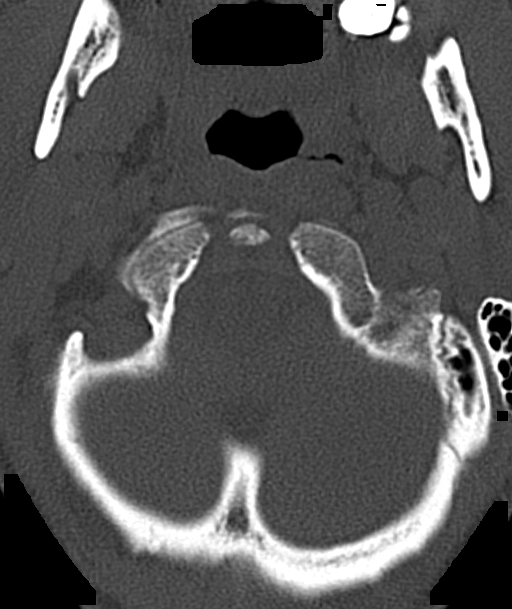

[14 of 33 positions shown; findings below may reference images not displayed]

FINDINGS: CT HEAD FINDINGS

Brain: No intracranial hemorrhage, mass effect, or midline shift. No
hydrocephalus. The basilar cisterns are patent. No evidence of
territorial infarct or acute ischemia. No extra-axial or
intracranial fluid collection.

Vascular: No hyperdense vessel or unexpected calcification.

Skull: No fracture or focal lesion.

Sinuses/Orbits: Paranasal sinuses and mastoid air cells are clear.
The visualized orbits are unremarkable.

Other: None.

CT CERVICAL SPINE FINDINGS

Alignment: Straightening of normal lordosis. No traumatic
subluxation.

Skull base and vertebrae: No acute fracture. Vertebral body heights
are maintained. The dens and skull base are intact.

Soft tissues and spinal canal: No prevertebral fluid or swelling. No
visible canal hematoma.

Disc levels: Disc space narrowing and endplate spurring most
prominent at C4-C5 and C5-C6.

Upper chest: No apical pneumothorax or acute findings.

Other: None.
IMPRESSION: 1.  No acute intracranial abnormality.  No skull fracture.
2. Mild degenerative change in the cervical spine without acute
fracture.

## 2017-08-23 IMAGING — CT CT FEMUR *L* W/O CM
3 of 4 series · 13 of 35 positions shown, 15 images · non-contrast
Comparison: CT abdomen pelvis dated [DATE].

CLINICAL DATA: Left hip pain after fall.

EXAM:
CT OF THE LOWER LEFT EXTREMITY WITHOUT CONTRAST
TECHNIQUE: Multidetector CT imaging of the lower left extremity was performed
according to the standard protocol.

[Series 4: axial bone · axial · 0.40mm/px · z∈[-1143,-799]mm · 5 of 346 slices shown, 7 images]
[im 58/346  soft-tissue]
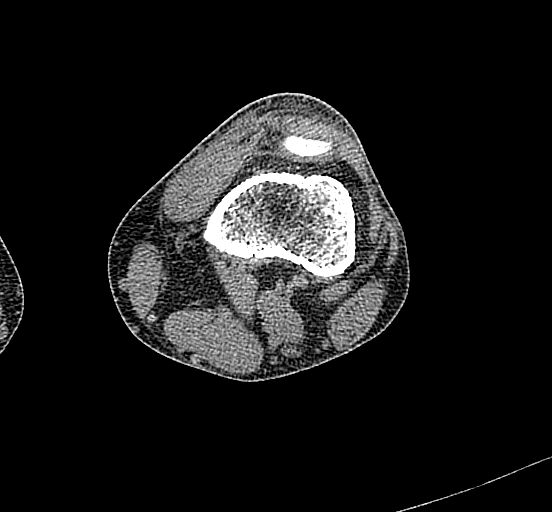
[im 58/346  bone]
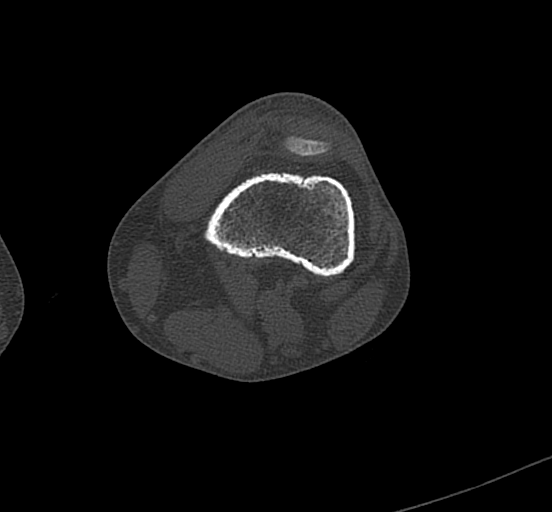
[im 116/346  bone]
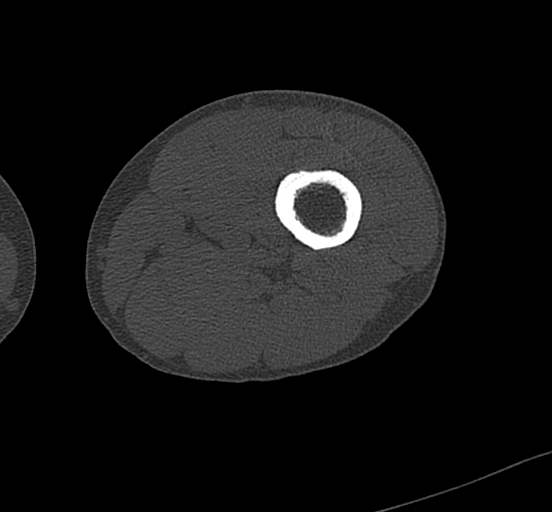
[im 173/346  bone]
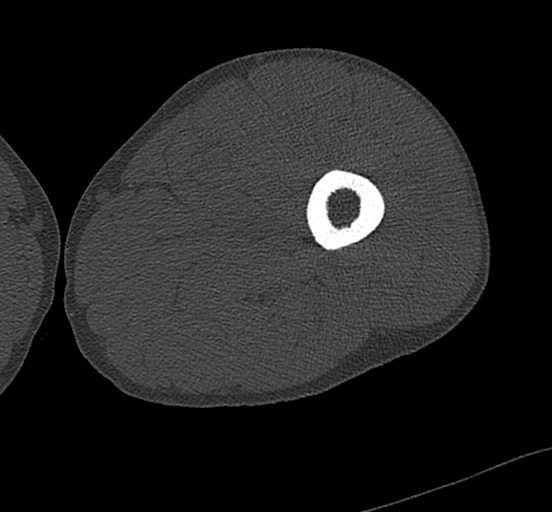
[im 231/346  bone]
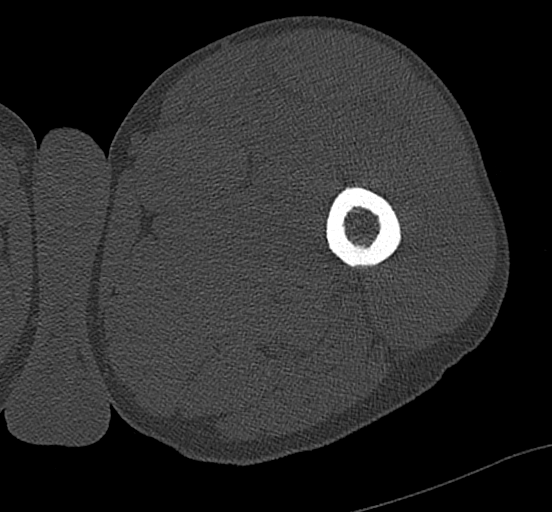
[im 288/346  soft-tissue]
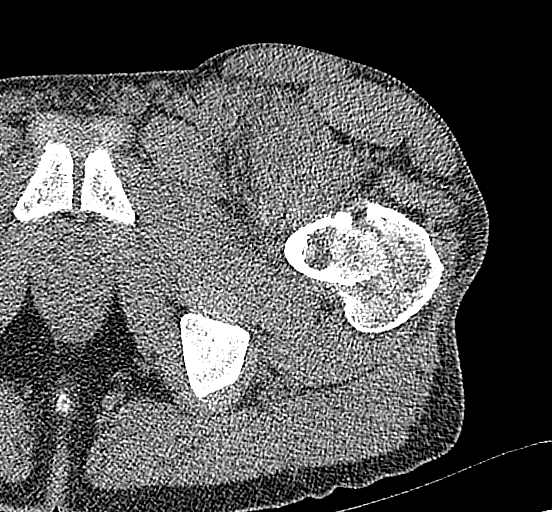
[im 288/346  bone]
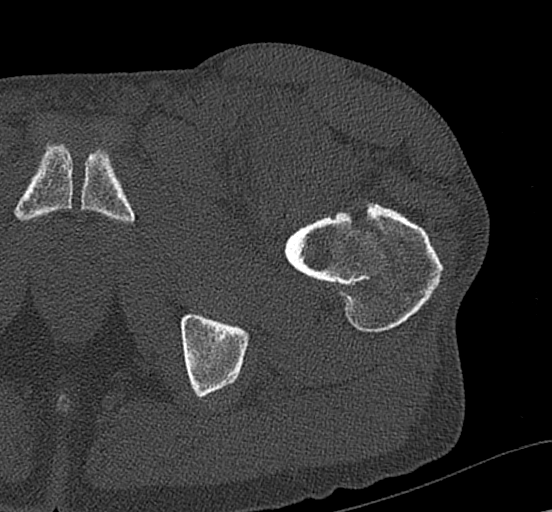

[Series 7: axial st · axial · 0.41mm/px · z∈[-1144,-808]mm · 5 of 338 slices shown]
[im 57/338  bone]
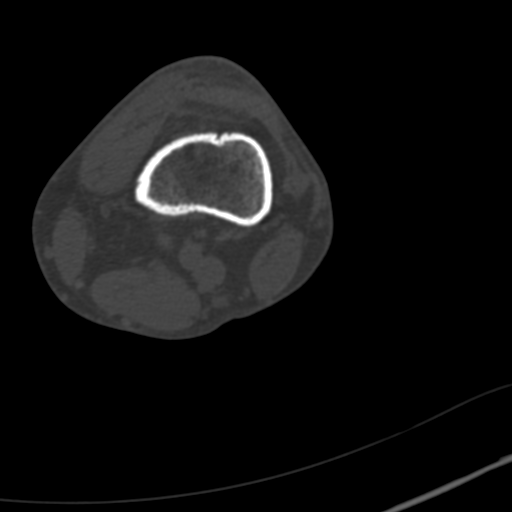
[im 113/338  bone]
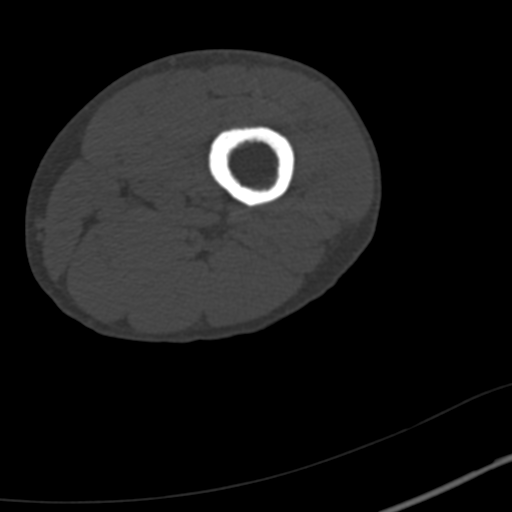
[im 169/338  bone]
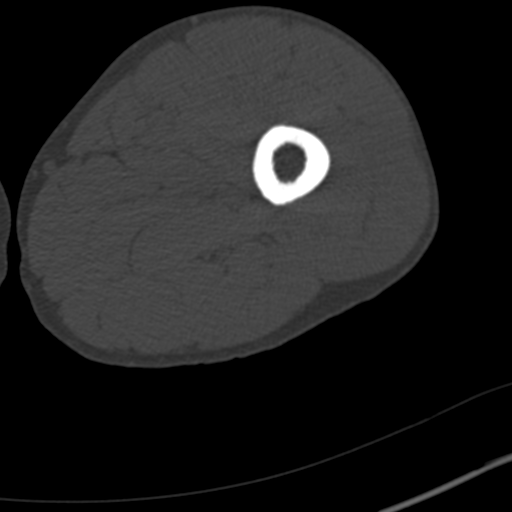
[im 225/338  bone]
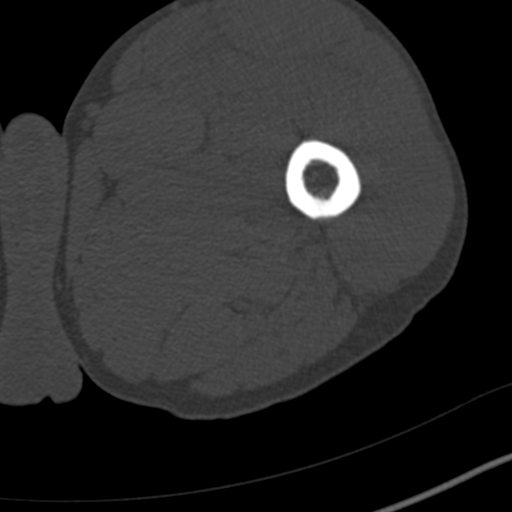
[im 281/338  bone]
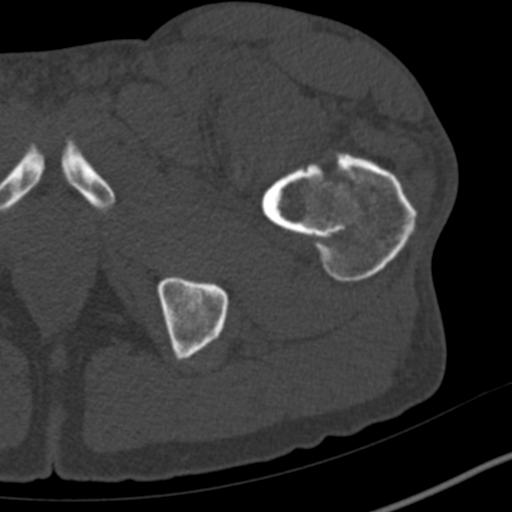

[Series 8: coronal st · coronal · 0.40mm/px · 3 of 130 slices shown]
[im 26/130  bone]
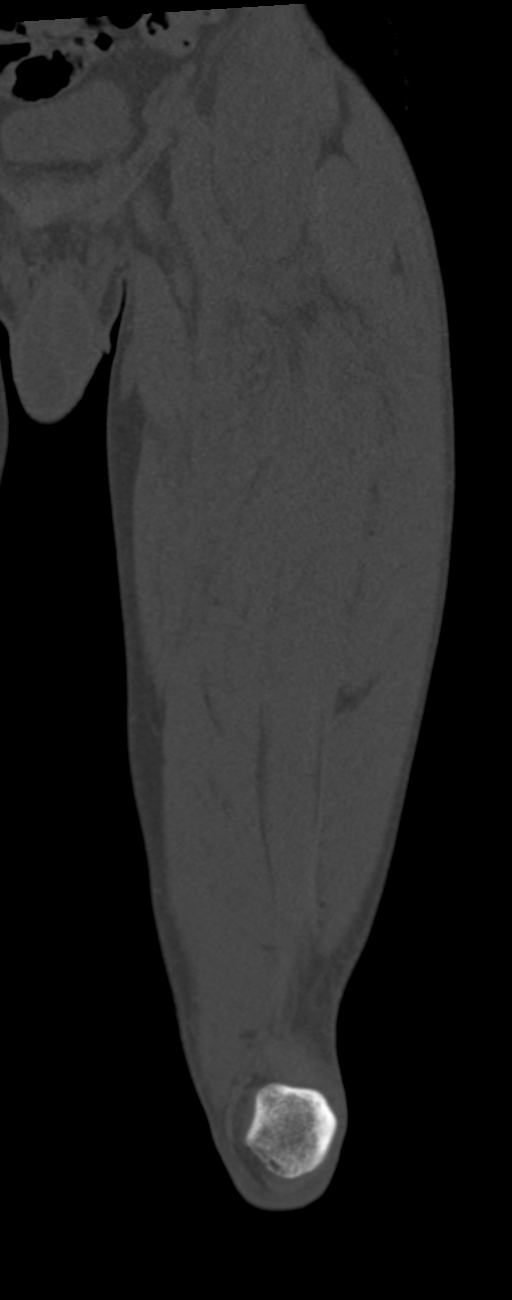
[im 52/130  bone]
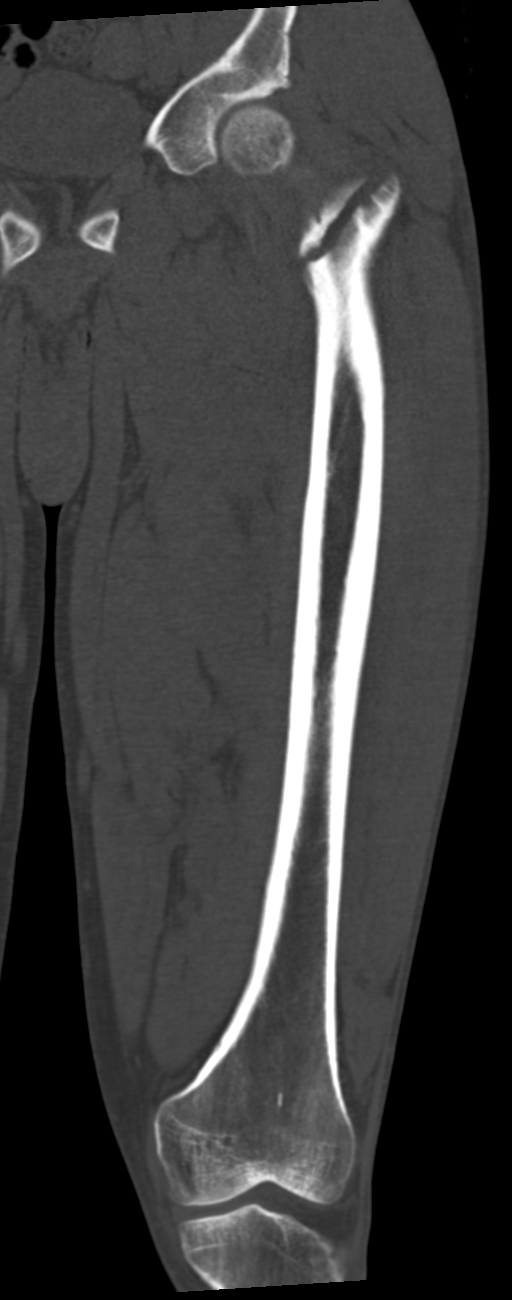
[im 78/130  bone]
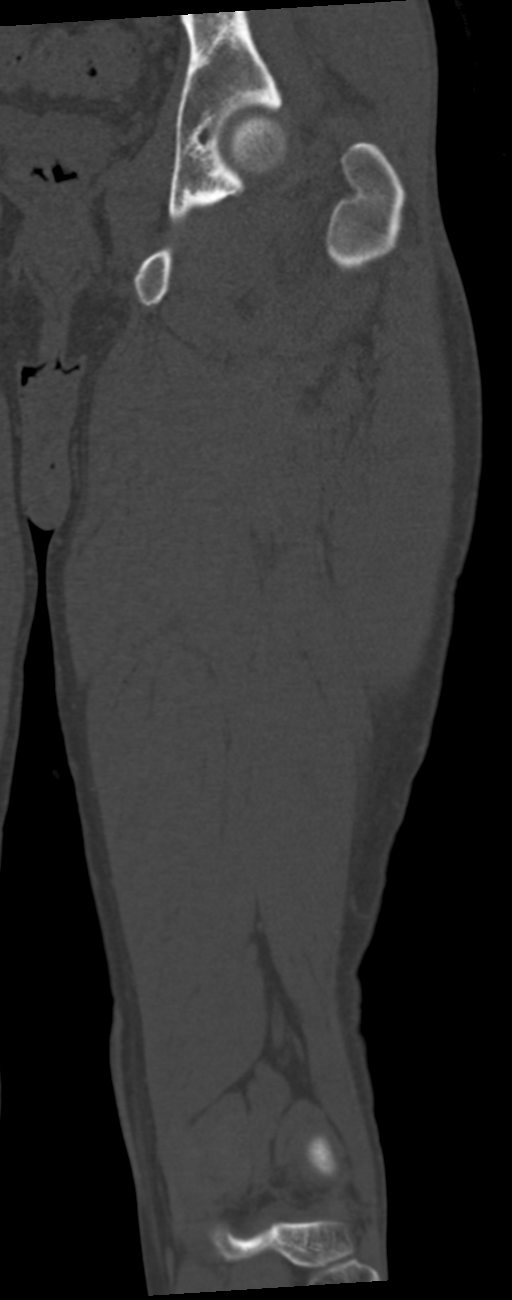

[13 of 35 positions shown; findings below may reference images not displayed]

FINDINGS: Bones/Joint/Cartilage

Acute, comminuted, minimally displaced left intertrochanteric femur
fracture. No additional fracture. No dislocation. The knee and hip
joint spaces are preserved. No joint effusion. Bone mineralization
is normal.

Ligaments

Suboptimally assessed by CT.

Muscles and Tendons

Unremarkable.  The iliopsoas tendon appears intact.

Soft tissues

Small amount of soft tissue hematoma surrounding the fracture.
IMPRESSION: 1. Acute, comminuted, minimally displaced left intertrochanteric
femur fracture.

## 2017-08-23 IMAGING — DX DG HAND COMPLETE 3+V*L*
3 series · 3 of 3 positions shown · non-contrast
Comparison: None.

CLINICAL DATA: Status post fall down stairs, with acute onset of
left hand pain. Initial encounter.

EXAM:
LEFT HAND - COMPLETE 3+ VIEW

[hand ap]
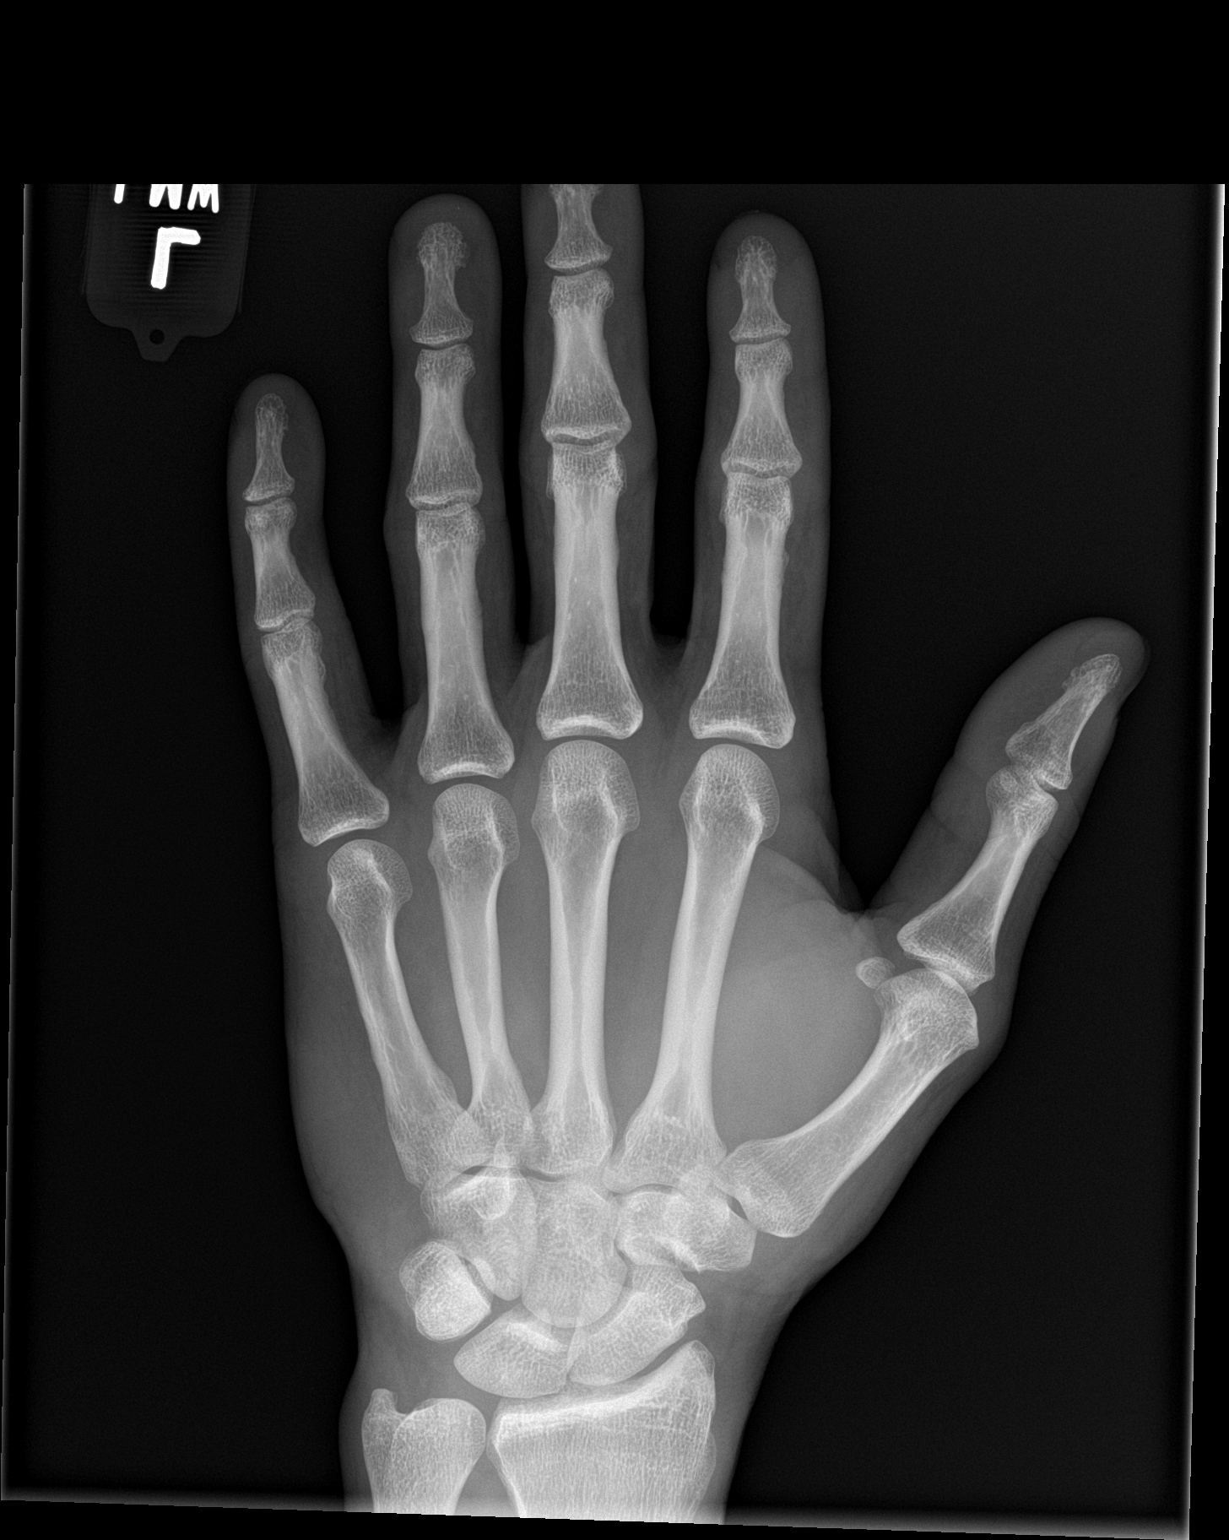

[hand obl]
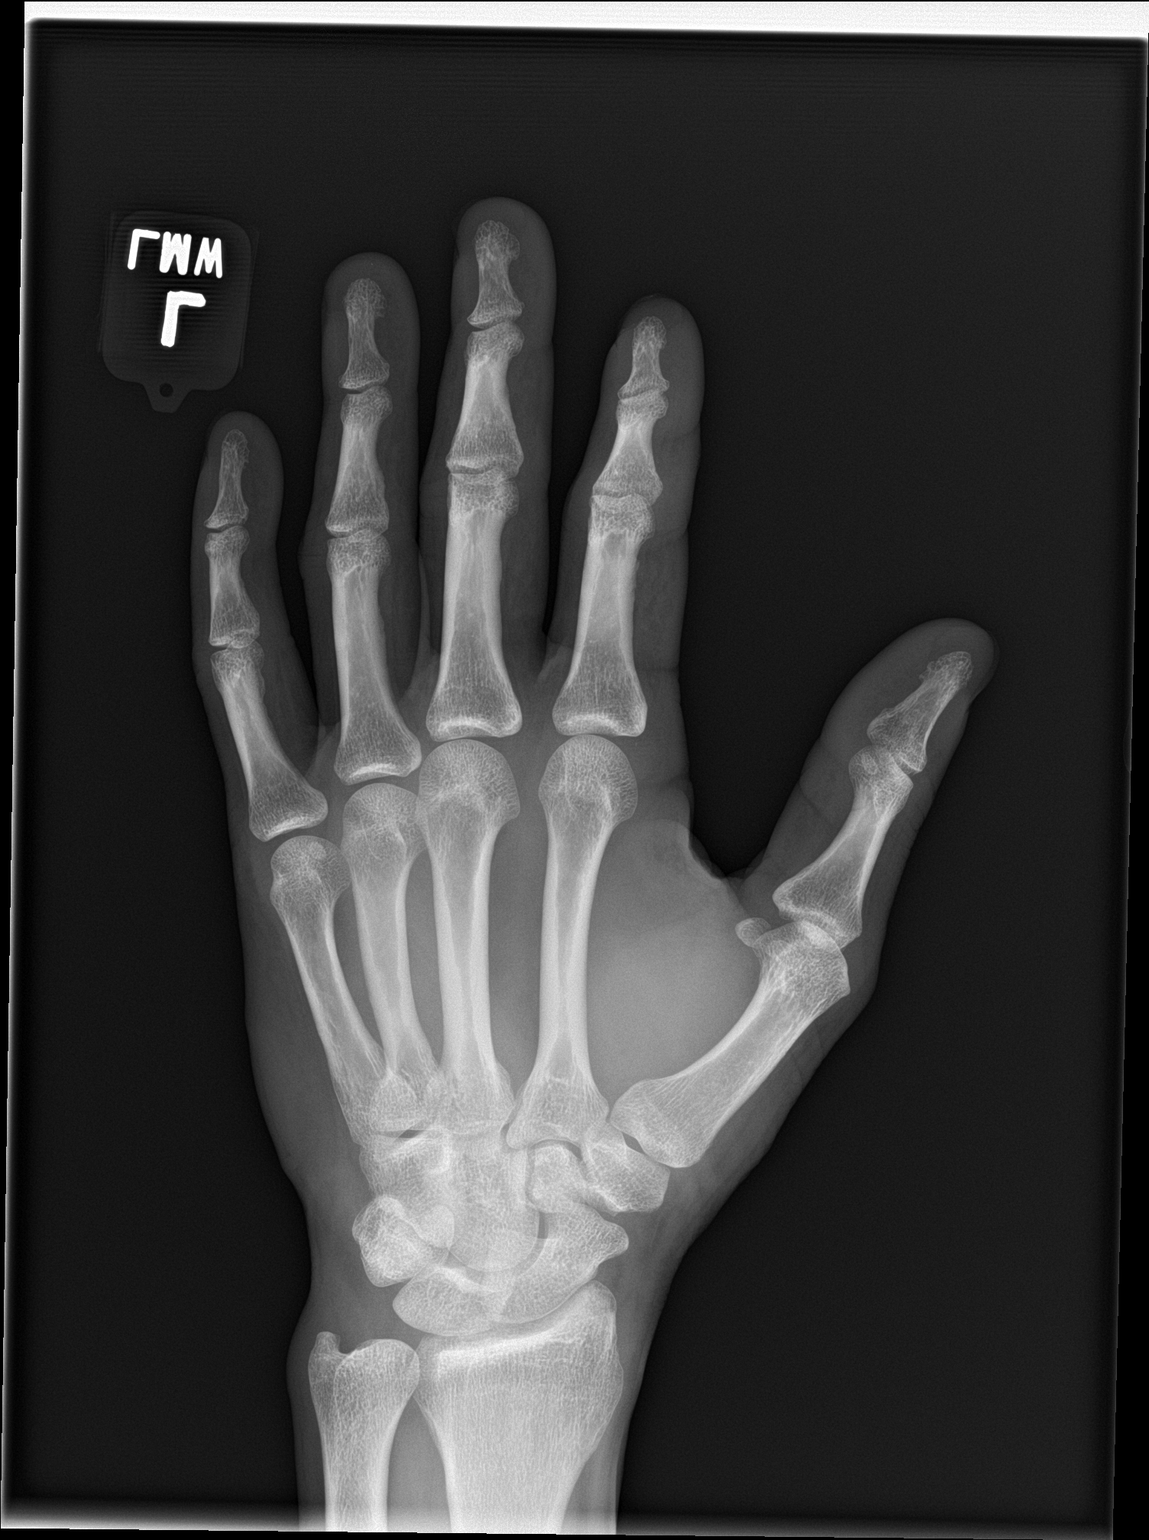

[hand lat]
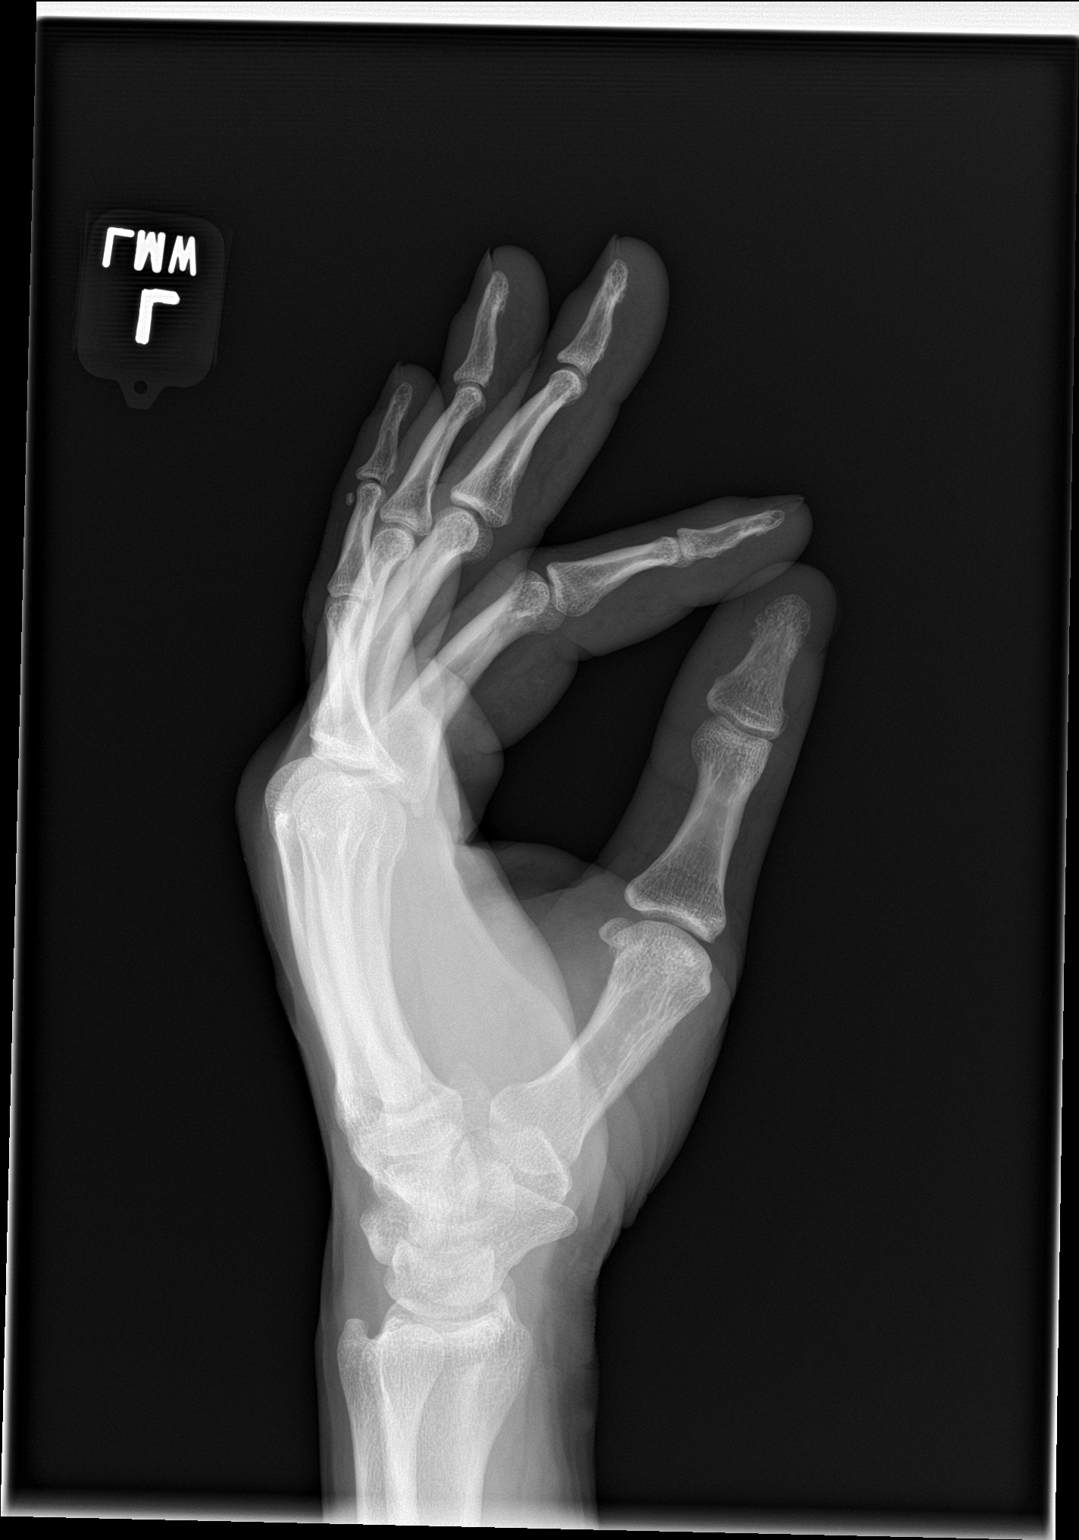

[3 of 3 positions shown; findings below may reference images not displayed]

FINDINGS: There is no evidence of fracture or dislocation. The joint spaces
are preserved. The carpal rows are intact, and demonstrate normal
alignment.

The soft tissues are unremarkable in appearance.
IMPRESSION: No evidence of fracture or dislocation.

## 2017-08-23 IMAGING — CT CT ABD-PELV W/ CM
2 of 5 series · 13 of 46 positions shown, 15 images · IV contrast (iopamidol)
Comparison: CT of the abdomen and pelvis performed [DATE]

CLINICAL DATA: Status post fall down stairs, with water heater
falling on patient. Left hip and chest pain. Left clavicular pain.

EXAM:
CT CHEST, ABDOMEN, AND PELVIS WITH CONTRAST
TECHNIQUE: Multidetector CT imaging of the chest, abdomen and pelvis was
performed following the standard protocol during bolus
administration of intravenous contrast.
CONTRAST:  100mL [V9] IOPAMIDOL ([V9]) INJECTION 76%

[Series 2: cap with · axial · 0.69mm/px · z∈[-801,-271]mm · 10 of 128 slices shown, 12 images]
[im 11/128  soft-tissue]
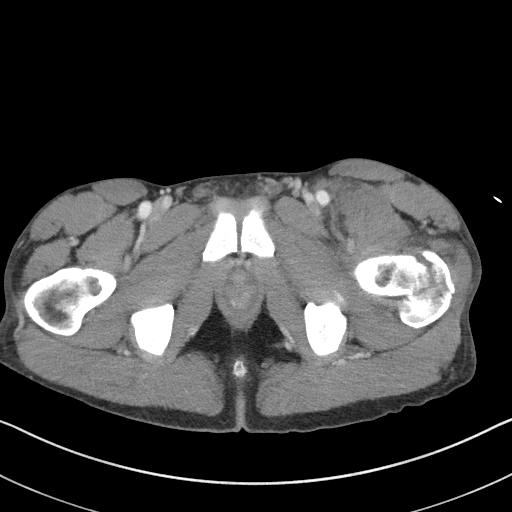
[im 11/128  bone]
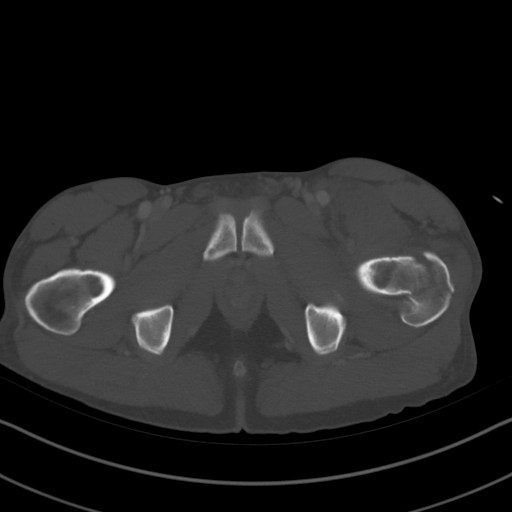
[im 22/128  soft-tissue]
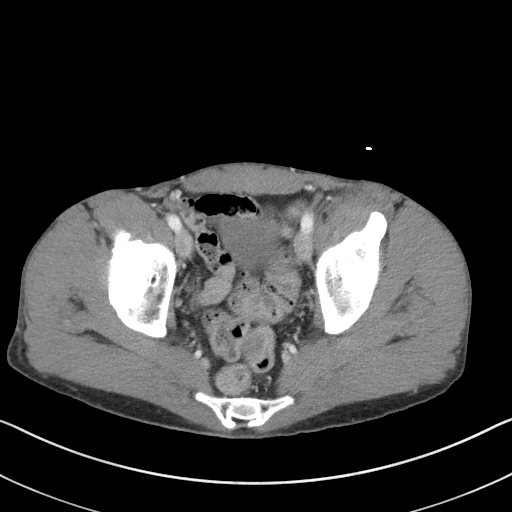
[im 32/128  soft-tissue]
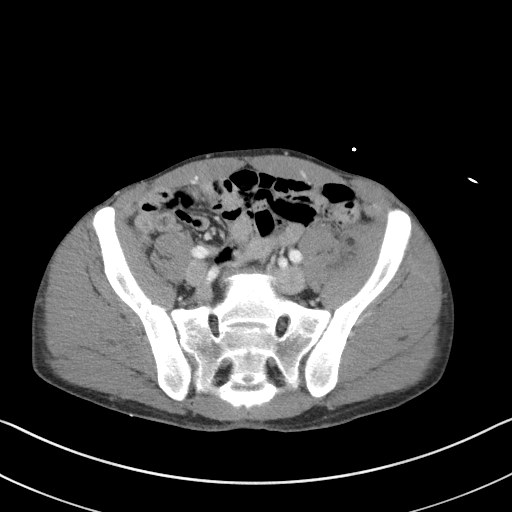
[im 43/128  soft-tissue]
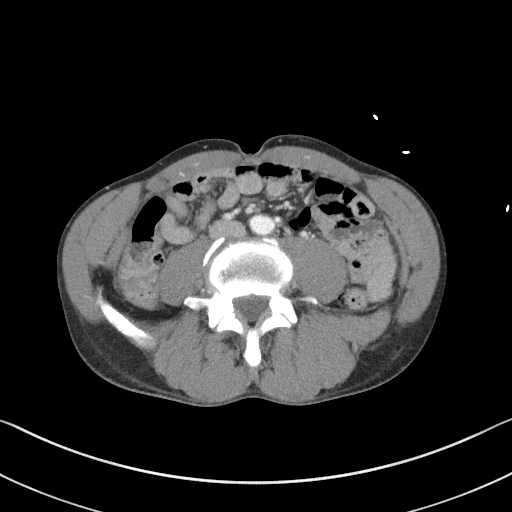
[im 53/128  soft-tissue]
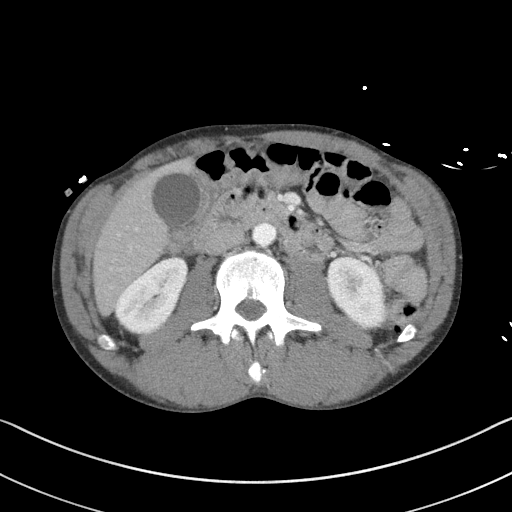
[im 75/128  soft-tissue]
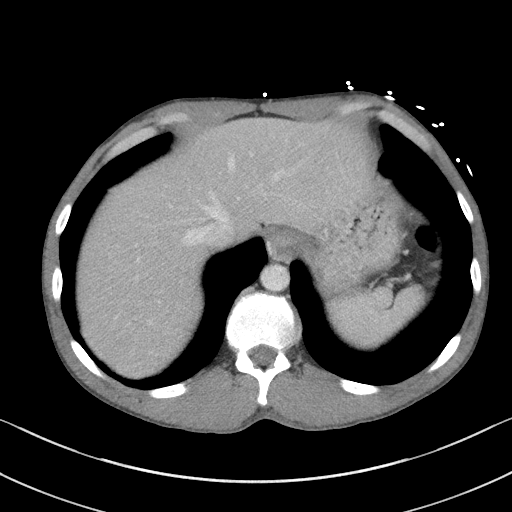
[im 85/128  soft-tissue]
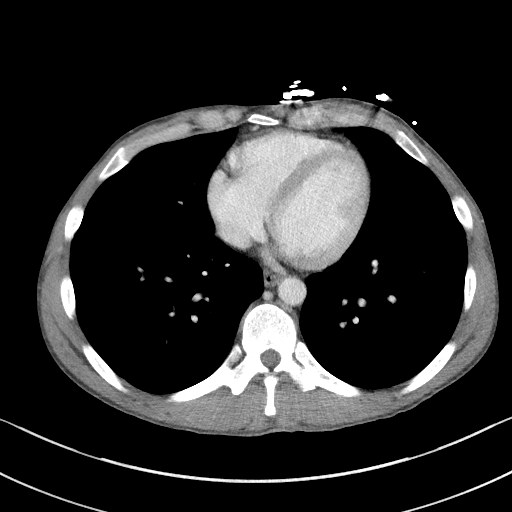
[im 96/128  soft-tissue]
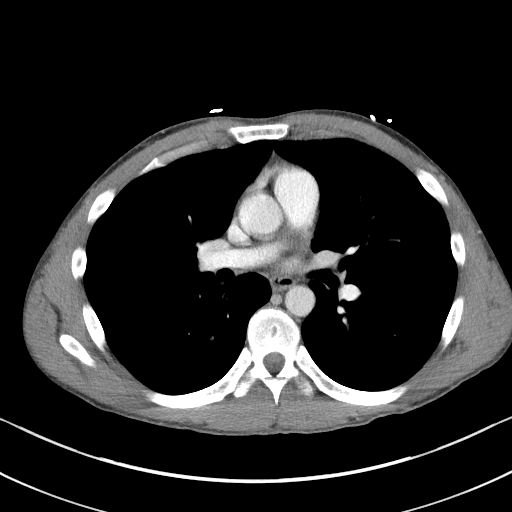
[im 106/128  soft-tissue]
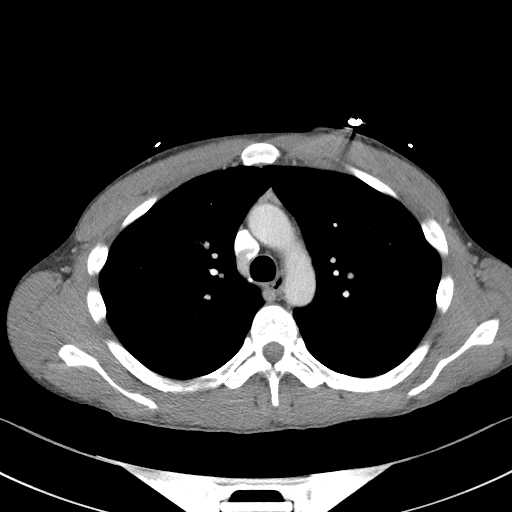
[im 106/128  bone]
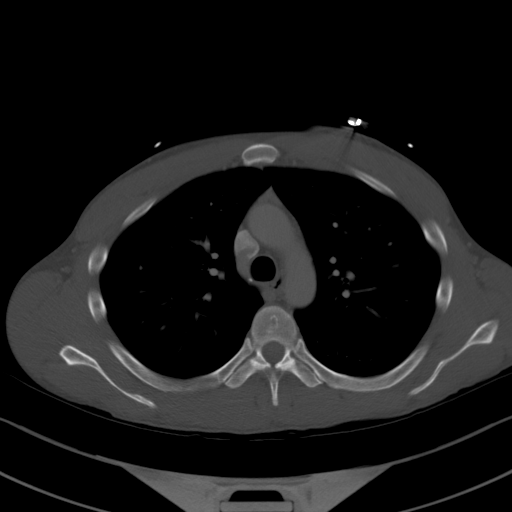
[im 117/128  soft-tissue]
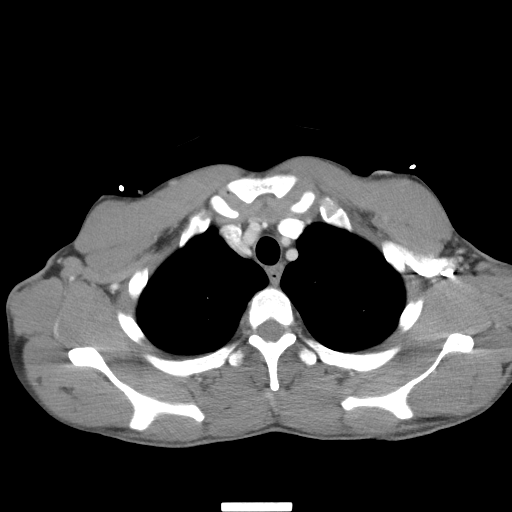

[Series 5: coronals · coronal · 0.77mm/px · 3 of 116 slices shown]
[im 39/116  soft-tissue]
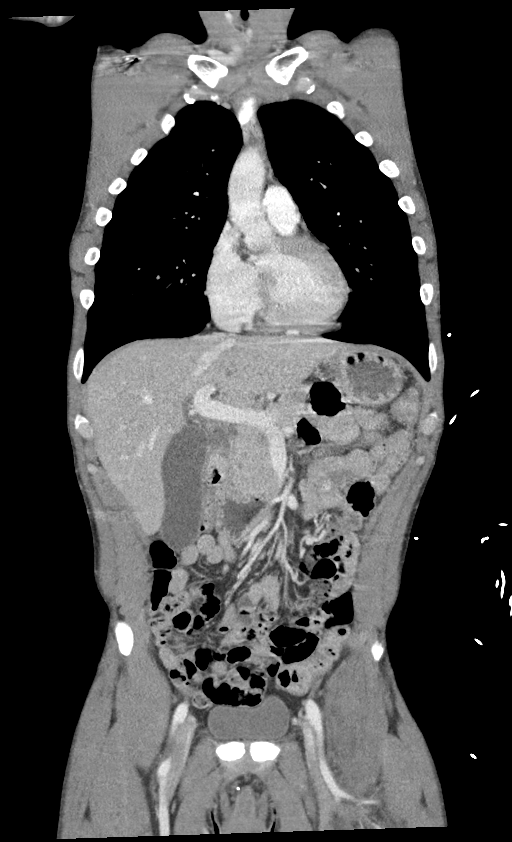
[im 52/116  soft-tissue]
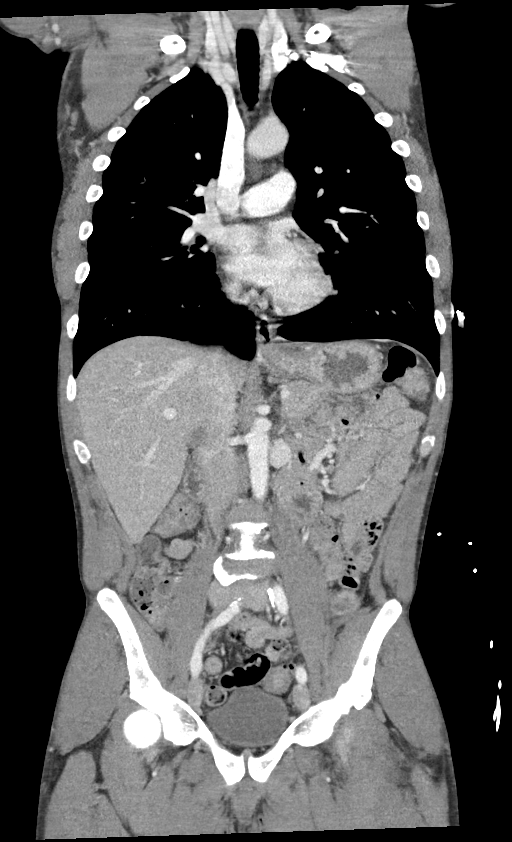
[im 64/116  soft-tissue]
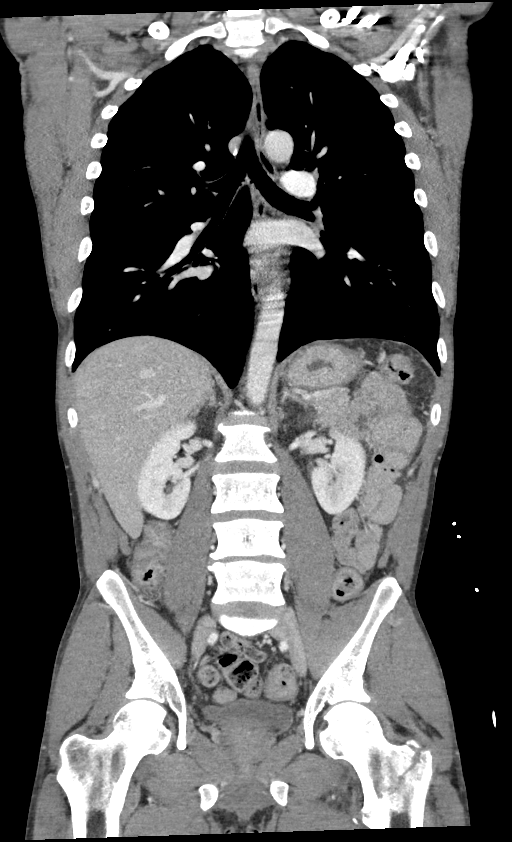

[13 of 46 positions shown; findings below may reference images not displayed]

FINDINGS: CT CHEST FINDINGS

Cardiovascular: The heart is normal in size. There is no evidence of
aortic injury. The thoracic aorta is unremarkable. The great vessels
are within normal limits. There is no evidence of venous hemorrhage.

Mediastinum/Nodes: The mediastinum is unremarkable. No mediastinal
lymphadenopathy is seen. No pericardial effusion is identified. The
thyroid gland is unremarkable. No axillary lymphadenopathy is
appreciated.

Lungs/Pleura: A bleb is noted at the medial aspect of the left lung.
A few tiny left-sided pulmonary nodules are seen measuring up to 4
mm in size. The lungs are otherwise clear. There is no evidence of
focal consolidation, pleural effusion or pneumothorax. There is no
evidence of pulmonary parenchymal contusion.

Musculoskeletal: No acute osseous abnormalities are identified. The
visualized musculature is unremarkable in appearance.

CT ABDOMEN PELVIS FINDINGS

Hepatobiliary: The liver is unremarkable in appearance. The
gallbladder is unremarkable in appearance. The common bile duct
remains normal in caliber.

Pancreas: The pancreas is within normal limits.

Spleen: The spleen is unremarkable in appearance.

Adrenals/Urinary Tract: The adrenal glands are unremarkable in
appearance. The kidneys are within normal limits. There is no
evidence of hydronephrosis. No renal or ureteral stones are
identified. No perinephric stranding is seen.

Stomach/Bowel: Vague soft tissue inflammation and fluid is noted
about the first segment of the duodenum, adjacent to the
gallbladder. Would correlate clinically for evidence of duodenitis,
and consider endoscopy to assess for underlying ulceration. This is
slightly more proximal than the inflammation seen in [V9].

The stomach is grossly unremarkable in appearance. The small bowel
is unremarkable. The stomach is unremarkable in appearance. The
small bowel is within normal limits. The appendix is normal in
caliber, without evidence of appendicitis. The colon is unremarkable
in appearance.

Vascular/Lymphatic: The abdominal aorta is unremarkable in
appearance. Mild calcification is noted along the common iliac
arteries bilaterally. The inferior vena cava is grossly
unremarkable. No retroperitoneal lymphadenopathy is seen. No pelvic
sidewall lymphadenopathy is identified.

Reproductive: The bladder is mildly distended and grossly
unremarkable. The prostate is normal in size.

Other: No additional soft tissue abnormalities are seen.

Musculoskeletal: There is a comminuted left femoral
intertrochanteric fracture, with greater and lesser trochanteric
fragments. Diffuse soft tissue edema is noted along the left
quadriceps and left iliacus musculature, concerning for acute
traumatic injury.
IMPRESSION: 1. Comminuted left femoral intertrochanteric fracture, with greater
and lesser trochanteric fragments.
2. Diffuse soft tissue injury involving the left quadriceps and left
iliacus musculature.
3. Vague soft tissue inflammation and fluid about the first segment
of the duodenum, adjacent to the gallbladder. Would correlate
clinically for evidence of duodenitis, and consider endoscopy to
assess for underlying ulceration. This is slightly more proximal
than the inflammation seen in [V9].
4. Few tiny left-sided pulmonary nodules, measuring up to 4 mm in
size. No follow-up needed if patient is low-risk (and has no known
or suspected primary neoplasm). Non-contrast chest CT can be
considered in 12 months if patient is high-risk. This recommendation
follows the consensus statement: Guidelines for Management of
Incidental Pulmonary Nodules Detected on CT Images: From the

## 2017-08-23 MED ORDER — CEFAZOLIN SODIUM-DEXTROSE 1-4 GM/50ML-% IV SOLN
1.0000 g | INTRAVENOUS | Status: AC
  Administered 2017-08-24: 2 g via INTRAVENOUS
  Filled 2017-08-23 (×2): qty 50

## 2017-08-23 MED ORDER — ONDANSETRON HCL 4 MG/2ML IJ SOLN
4.0000 mg | Freq: Once | INTRAMUSCULAR | Status: AC
  Administered 2017-08-23: 4 mg via INTRAVENOUS
  Filled 2017-08-23: qty 2

## 2017-08-23 MED ORDER — IOPAMIDOL (ISOVUE-370) INJECTION 76%
100.0000 mL | Freq: Once | INTRAVENOUS | Status: AC | PRN
  Administered 2017-08-23: 100 mL via INTRAVENOUS

## 2017-08-23 MED ORDER — HYDROMORPHONE HCL 1 MG/ML IJ SOLN
0.5000 mg | INTRAMUSCULAR | Status: AC
  Administered 2017-08-23: 0.5 mg via INTRAVENOUS
  Filled 2017-08-23: qty 1

## 2017-08-23 MED ORDER — HYDROMORPHONE HCL 1 MG/ML IJ SOLN
1.0000 mg | INTRAMUSCULAR | Status: AC
  Administered 2017-08-23: 1 mg via INTRAVENOUS
  Filled 2017-08-23: qty 1

## 2017-08-23 MED ORDER — CEFAZOLIN (ANCEF) 1 G IV SOLR: 1.0000 g | INTRAVENOUS | Status: DC

## 2017-08-23 MED ORDER — SODIUM CHLORIDE 0.9 % IV BOLUS
1000.0000 mL | Freq: Once | INTRAVENOUS | Status: AC
  Administered 2017-08-23: 1000 mL via INTRAVENOUS

## 2017-08-23 NOTE — H&P (Signed)
[facility] [place] [facility]   PATIENT NAME: Benjamin Obrien    MR#:  [id number]  DATE OF BIRTH:  1976-02-08  DATE OF ADMISSION:  08/23/2017  PRIMARY CARE PHYSICIAN: Patient, No Pcp Per   REQUESTING/REFERRING PHYSICIAN:   CHIEF COMPLAINT:   Chief Complaint  Patient presents with  . Hip Injury    HISTORY OF PRESENT ILLNESS: Benjamin Obrien  is a 42 y.o. male, without significant past medical history except for tobacco abuse and an episode of pancreatitis in the past. Patient presented to emergency room for left hip pain started acutely after falling down the stairs with a 300 pounds water heater on top of him.  He is not able to bear weight on the left leg and there is severe tenderness with range of motion at left hip joint.  He denies losing consciousness during the event. Upon evaluation in emergency room, he was diagnosed with acute, comminuted, minimally displaced left intertrochanteric femur fracture.  Blood test results, including CBC and CMP are unremarkable, except for elevated WBC at 16.7.  CT of the chest reveals as incidental finding, few tiny left-sided pulmonary nodules, measuring up to 4 mm in size.  EKG shows normal sinus rhythm with heart rate 100 bpm.  No evidence of acute ischemia.  Mild repolarization abnormality noted.  Patient is usually active, working around the house.  He is able to ambulate without any shortness of breath or chest pain.    PAST MEDICAL HISTORY:   Past Medical History:  Diagnosis Date  . Pancreatitis     PAST SURGICAL HISTORY:  Past Surgical History:  Procedure Laterality Date  . TOOTH EXTRACTION      SOCIAL HISTORY:  Social History   Tobacco Use  . Smoking status: Current Every Day Smoker    Packs/day: 1.00    Types: Cigarettes  . Smokeless tobacco: Never Used  Substance Use Topics  . Alcohol use: Yes    Comment: Saturdays    FAMILY HISTORY: Hypertension in mother.  DRUG ALLERGIES: No Known  Allergies  REVIEW OF SYSTEMS:   CONSTITUTIONAL: No fever, fatigue or weakness.  EYES: No blurred or double vision.  EARS, NOSE, AND THROAT: No tinnitus or ear pain.  RESPIRATORY: No cough, shortness of breath, wheezing or hemoptysis.  CARDIOVASCULAR: No chest pain, orthopnea, edema.  GASTROINTESTINAL: No nausea, vomiting, diarrhea or abdominal pain.  GENITOURINARY: No dysuria, hematuria.  ENDOCRINE: No polyuria, nocturia,  HEMATOLOGY: No bleeding SKIN: No rash or lesion. MUSCULOSKELETAL: Positive for severe left hip tenderness.   NEUROLOGIC: No focal weakness.  PSYCHIATRY: No anxiety or depression.   MEDICATIONS AT HOME:  Prior to Admission medications   Not on File      PHYSICAL EXAMINATION:   VITAL SIGNS: Blood pressure 119/81, pulse 98, temperature 99 F (37.2 C), temperature source Oral, resp. rate 16, height 5\' 7"  (1.702 m), weight 65.8 kg (145 lb), SpO2 99 %.  GENERAL:  42 y.o.-year-old patient lying in the bed with moderate distress.,  Secondary to left hip pain EYES: Pupils equal, round, reactive to light and accommodation. No scleral icterus. Extraocular muscles intact.  HEENT: Head atraumatic, normocephalic. Oropharynx and nasopharynx clear.  NECK:  Supple, no jugular venous distention. No thyroid enlargement, no tenderness.  LUNGS: Normal breath sounds bilaterally, no wheezing, rales,rhonchi or crepitation. No use of accessory muscles of respiration.  CARDIOVASCULAR: S1, S2 normal. No murmurs, rubs, or gallops.  ABDOMEN: Soft, nontender, nondistended. Bowel sounds present. No organomegaly or mass.  EXTREMITIES:  Left lower extremities noted to be shortened and externally rotated.  There is severe tenderness with range of motion at left hip joint. NEUROLOGIC: No focal weakness PSYCHIATRIC: The patient is alert and oriented x 3.  SKIN: No obvious rash, lesion, or ulcer.   LABORATORY PANEL:   CBC Recent Labs  Lab 08/23/17 2025  WBC 16.7*  HGB 13.7  HCT 40.0   PLT 378  MCV 92.8  MCH 31.8  MCHC 34.3  RDW 13.1   ------------------------------------------------------------------------------------------------------------------  Chemistries  Recent Labs  Lab 08/23/17 2025  NA 133*  K 4.1  CL 101  CO2 19*  GLUCOSE 141*  BUN 12  CREATININE 0.91  CALCIUM 8.2*  AST 39  ALT 25  ALKPHOS 41  BILITOT 0.5   ------------------------------------------------------------------------------------------------------------------ estimated creatinine clearance is 98.4 mL/min (by C-G formula based on SCr of 0.91 mg/dL). ------------------------------------------------------------------------------------------------------------------ No results for input(s): TSH, T4TOTAL, T3FREE, THYROIDAB in the last 72 hours.  Invalid input(s): FREET3   Coagulation profile Recent Labs  Lab 08/23/17 2025  INR 0.96   ------------------------------------------------------------------------------------------------------------------- No results for input(s): DDIMER in the last 72 hours. -------------------------------------------------------------------------------------------------------------------  Cardiac Enzymes No results for input(s): CKMB, TROPONINI, MYOGLOBIN in the last 168 hours.  Invalid input(s): CK ------------------------------------------------------------------------------------------------------------------ Invalid input(s): POCBNP  ---------------------------------------------------------------------------------------------------------------  Urinalysis    Component Value Date/Time   COLORURINE YELLOW (A) 01/28/2015 1151   APPEARANCEUR CLEAR (A) 01/28/2015 1151   LABSPEC 1.019 01/28/2015 1151   PHURINE 5.0 01/28/2015 1151   GLUCOSEU NEGATIVE 01/28/2015 1151   HGBUR 1+ (A) 01/28/2015 1151   BILIRUBINUR NEGATIVE 01/28/2015 1151   KETONESUR NEGATIVE 01/28/2015 1151   PROTEINUR NEGATIVE 01/28/2015 1151   NITRITE NEGATIVE 01/28/2015 1151    LEUKOCYTESUR NEGATIVE 01/28/2015 1151     RADIOLOGY: Ct Head Wo Contrast  Result Date: 08/23/2017 CLINICAL DATA:  Fall down stairs while carrying water heater, water heater fell on patient. EXAM: CT HEAD WITHOUT CONTRAST CT CERVICAL SPINE WITHOUT CONTRAST TECHNIQUE: Multidetector CT imaging of the head and cervical spine was performed following the standard protocol without intravenous contrast. Multiplanar CT image reconstructions of the cervical spine were also generated. COMPARISON:  None. FINDINGS: CT HEAD FINDINGS Brain: No intracranial hemorrhage, mass effect, or midline shift. No hydrocephalus. The basilar cisterns are patent. No evidence of territorial infarct or acute ischemia. No extra-axial or intracranial fluid collection. Vascular: No hyperdense vessel or unexpected calcification. Skull: No fracture or focal lesion. Sinuses/Orbits: Paranasal sinuses and mastoid air cells are clear. The visualized orbits are unremarkable. Other: None. CT CERVICAL SPINE FINDINGS Alignment: Straightening of normal lordosis. No traumatic subluxation. Skull base and vertebrae: No acute fracture. Vertebral body heights are maintained. The dens and skull base are intact. Soft tissues and spinal canal: No prevertebral fluid or swelling. No visible canal hematoma. Disc levels: Disc space narrowing and endplate spurring most prominent at C4-C5 and C5-C6. Upper chest: No apical pneumothorax or acute findings. Other: None. IMPRESSION: 1.  No acute intracranial abnormality.  No skull fracture. 2. Mild degenerative change in the cervical spine without acute fracture. Electronically Signed   By: Rubye Oaks M.D.   On: 08/23/2017 21:52   Ct Chest W Contrast  Result Date: 08/23/2017 CLINICAL DATA:  Status post fall down stairs, with water heater falling on patient. Left hip and chest pain. Left clavicular pain. EXAM: CT CHEST, ABDOMEN, AND PELVIS WITH CONTRAST TECHNIQUE: Multidetector CT imaging of the chest, abdomen  and pelvis was performed following the standard protocol during bolus administration of intravenous contrast. CONTRAST:  ISOVUE-370 IOPAMIDOL (ISOVUE-370) INJECTION 76% COMPARISON:  CT of the abdomen and pelvis performed 01/28/2015 FINDINGS: CT CHEST FINDINGS Cardiovascular: The heart is normal in size. There is no evidence of aortic injury. The thoracic aorta is unremarkable. The great vessels are within normal limits. There is no evidence of venous hemorrhage. Mediastinum/Nodes: The mediastinum is unremarkable. No mediastinal lymphadenopathy is seen. No pericardial effusion is identified. The thyroid gland is unremarkable. No axillary lymphadenopathy is appreciated. Lungs/Pleura: A bleb is noted at the medial aspect of the left lung. A few tiny left-sided pulmonary nodules are seen measuring up to 4 mm in size. The lungs are otherwise clear. There is no evidence of focal consolidation, pleural effusion or pneumothorax. There is no evidence of pulmonary parenchymal contusion. Musculoskeletal: No acute osseous abnormalities are identified. The visualized musculature is unremarkable in appearance. CT ABDOMEN PELVIS FINDINGS Hepatobiliary: The liver is unremarkable in appearance. The gallbladder is unremarkable in appearance. The common bile duct remains normal in caliber. Pancreas: The pancreas is within normal limits. Spleen: The spleen is unremarkable in appearance. Adrenals/Urinary Tract: The adrenal glands are unremarkable in appearance. The kidneys are within normal limits. There is no evidence of hydronephrosis. No renal or ureteral stones are identified. No perinephric stranding is seen. Stomach/Bowel: Vague soft tissue inflammation and fluid is noted about the first segment of the duodenum, adjacent to the gallbladder. Would correlate clinically for evidence of duodenitis, and consider endoscopy to assess for underlying ulceration. This is slightly more proximal than the inflammation seen in 2016. The  stomach is grossly unremarkable in appearance. The small bowel is unremarkable. The stomach is unremarkable in appearance. The small bowel is within normal limits. The appendix is normal in caliber, without evidence of appendicitis. The colon is unremarkable in appearance. Vascular/Lymphatic: The abdominal aorta is unremarkable in appearance. Mild calcification is noted along the common iliac arteries bilaterally. The inferior vena cava is grossly unremarkable. No retroperitoneal lymphadenopathy is seen. No pelvic sidewall lymphadenopathy is identified. Reproductive: The bladder is mildly distended and grossly unremarkable. The prostate is normal in size. Other: No additional soft tissue abnormalities are seen. Musculoskeletal: There is a comminuted left femoral intertrochanteric fracture, with greater and lesser trochanteric fragments. Diffuse soft tissue edema is noted along the left quadriceps and left iliacus musculature, concerning for acute traumatic injury. IMPRESSION: 1. Comminuted left femoral intertrochanteric fracture, with greater and lesser trochanteric fragments. 2. Diffuse soft tissue injury involving the left quadriceps and left iliacus musculature. 3. Vague soft tissue inflammation and fluid about the first segment of the duodenum, adjacent to the gallbladder. Would correlate clinically for evidence of duodenitis, and consider endoscopy to assess for underlying ulceration. This is slightly more proximal than the inflammation seen in 2016. 4. Few tiny left-sided pulmonary nodules, measuring up to 4 mm in size. No follow-up needed if patient is low-risk (and has no known or suspected primary neoplasm). Non-contrast chest CT can be considered in 12 months if patient is high-risk. This recommendation follows the consensus statement: Guidelines for Management of Incidental Pulmonary Nodules Detected on CT Images: From the Fleischner Society 2017; Radiology 2017; 284:228-243. Electronically Signed   By:  Roanna Raider M.D.   On: 08/23/2017 21:56   Ct Cervical Spine Wo Contrast  Result Date: 08/23/2017 CLINICAL DATA:  Fall down stairs while carrying water heater, water heater fell on patient. EXAM: CT HEAD WITHOUT CONTRAST CT CERVICAL SPINE WITHOUT CONTRAST TECHNIQUE: Multidetector CT imaging of the head and cervical spine was performed following the standard protocol  without intravenous contrast. Multiplanar CT image reconstructions of the cervical spine were also generated. COMPARISON:  None. FINDINGS: CT HEAD FINDINGS Brain: No intracranial hemorrhage, mass effect, or midline shift. No hydrocephalus. The basilar cisterns are patent. No evidence of territorial infarct or acute ischemia. No extra-axial or intracranial fluid collection. Vascular: No hyperdense vessel or unexpected calcification. Skull: No fracture or focal lesion. Sinuses/Orbits: Paranasal sinuses and mastoid air cells are clear. The visualized orbits are unremarkable. Other: None. CT CERVICAL SPINE FINDINGS Alignment: Straightening of normal lordosis. No traumatic subluxation. Skull base and vertebrae: No acute fracture. Vertebral body heights are maintained. The dens and skull base are intact. Soft tissues and spinal canal: No prevertebral fluid or swelling. No visible canal hematoma. Disc levels: Disc space narrowing and endplate spurring most prominent at C4-C5 and C5-C6. Upper chest: No apical pneumothorax or acute findings. Other: None. IMPRESSION: 1.  No acute intracranial abnormality.  No skull fracture. 2. Mild degenerative change in the cervical spine without acute fracture. Electronically Signed   By: Rubye Oaks M.D.   On: 08/23/2017 21:52   Ct Abdomen Pelvis W Contrast  Result Date: 08/23/2017 CLINICAL DATA:  Status post fall down stairs, with water heater falling on patient. Left hip and chest pain. Left clavicular pain. EXAM: CT CHEST, ABDOMEN, AND PELVIS WITH CONTRAST TECHNIQUE: Multidetector CT imaging of the chest,  abdomen and pelvis was performed following the standard protocol during bolus administration of intravenous contrast. CONTRAST:  ISOVUE-370 IOPAMIDOL (ISOVUE-370) INJECTION 76% COMPARISON:  CT of the abdomen and pelvis performed 01/28/2015 FINDINGS: CT CHEST FINDINGS Cardiovascular: The heart is normal in size. There is no evidence of aortic injury. The thoracic aorta is unremarkable. The great vessels are within normal limits. There is no evidence of venous hemorrhage. Mediastinum/Nodes: The mediastinum is unremarkable. No mediastinal lymphadenopathy is seen. No pericardial effusion is identified. The thyroid gland is unremarkable. No axillary lymphadenopathy is appreciated. Lungs/Pleura: A bleb is noted at the medial aspect of the left lung. A few tiny left-sided pulmonary nodules are seen measuring up to 4 mm in size. The lungs are otherwise clear. There is no evidence of focal consolidation, pleural effusion or pneumothorax. There is no evidence of pulmonary parenchymal contusion. Musculoskeletal: No acute osseous abnormalities are identified. The visualized musculature is unremarkable in appearance. CT ABDOMEN PELVIS FINDINGS Hepatobiliary: The liver is unremarkable in appearance. The gallbladder is unremarkable in appearance. The common bile duct remains normal in caliber. Pancreas: The pancreas is within normal limits. Spleen: The spleen is unremarkable in appearance. Adrenals/Urinary Tract: The adrenal glands are unremarkable in appearance. The kidneys are within normal limits. There is no evidence of hydronephrosis. No renal or ureteral stones are identified. No perinephric stranding is seen. Stomach/Bowel: Vague soft tissue inflammation and fluid is noted about the first segment of the duodenum, adjacent to the gallbladder. Would correlate clinically for evidence of duodenitis, and consider endoscopy to assess for underlying ulceration. This is slightly more proximal than the inflammation seen in  2016. The stomach is grossly unremarkable in appearance. The small bowel is unremarkable. The stomach is unremarkable in appearance. The small bowel is within normal limits. The appendix is normal in caliber, without evidence of appendicitis. The colon is unremarkable in appearance. Vascular/Lymphatic: The abdominal aorta is unremarkable in appearance. Mild calcification is noted along the common iliac arteries bilaterally. The inferior vena cava is grossly unremarkable. No retroperitoneal lymphadenopathy is seen. No pelvic sidewall lymphadenopathy is identified. Reproductive: The bladder is mildly distended and grossly unremarkable.  The prostate is normal in size. Other: No additional soft tissue abnormalities are seen. Musculoskeletal: There is a comminuted left femoral intertrochanteric fracture, with greater and lesser trochanteric fragments. Diffuse soft tissue edema is noted along the left quadriceps and left iliacus musculature, concerning for acute traumatic injury. IMPRESSION: 1. Comminuted left femoral intertrochanteric fracture, with greater and lesser trochanteric fragments. 2. Diffuse soft tissue injury involving the left quadriceps and left iliacus musculature. 3. Vague soft tissue inflammation and fluid about the first segment of the duodenum, adjacent to the gallbladder. Would correlate clinically for evidence of duodenitis, and consider endoscopy to assess for underlying ulceration. This is slightly more proximal than the inflammation seen in 2016. 4. Few tiny left-sided pulmonary nodules, measuring up to 4 mm in size. No follow-up needed if patient is low-risk (and has no known or suspected primary neoplasm). Non-contrast chest CT can be considered in 12 months if patient is high-risk. This recommendation follows the consensus statement: Guidelines for Management of Incidental Pulmonary Nodules Detected on CT Images: From the Fleischner Society 2017; Radiology 2017; 284:228-243. Electronically  Signed   By: Roanna Raider M.D.   On: 08/23/2017 21:56   Ct Femur Left Wo Contrast  Result Date: 08/23/2017 CLINICAL DATA:  Left hip pain after fall. EXAM: CT OF THE LOWER LEFT EXTREMITY WITHOUT CONTRAST TECHNIQUE: Multidetector CT imaging of the lower left extremity was performed according to the standard protocol. COMPARISON:  CT abdomen pelvis dated January 28, 2015. FINDINGS: Bones/Joint/Cartilage Acute, comminuted, minimally displaced left intertrochanteric femur fracture. No additional fracture. No dislocation. The knee and hip joint spaces are preserved. No joint effusion. Bone mineralization is normal. Ligaments Suboptimally assessed by CT. Muscles and Tendons Unremarkable.  The iliopsoas tendon appears intact. Soft tissues Small amount of soft tissue hematoma surrounding the fracture. IMPRESSION: 1. Acute, comminuted, minimally displaced left intertrochanteric femur fracture. Electronically Signed   By: Obie Dredge M.D.   On: 08/23/2017 21:51   Dg Hand Complete Left  Result Date: 08/23/2017 CLINICAL DATA:  Status post fall down stairs, with acute onset of left hand pain. Initial encounter. EXAM: LEFT HAND - COMPLETE 3+ VIEW COMPARISON:  None. FINDINGS: There is no evidence of fracture or dislocation. The joint spaces are preserved. The carpal rows are intact, and demonstrate normal alignment. The soft tissues are unremarkable in appearance. IMPRESSION: No evidence of fracture or dislocation. Electronically Signed   By: Roanna Raider M.D.   On: 08/23/2017 22:50    EKG: Orders placed or performed during the hospital encounter of 08/23/17  . ED EKG  . ED EKG  . EKG 12-Lead  . EKG 12-Lead    IMPRESSION AND PLAN:   1.  Left hip fracture, status post accidental trauma.  For surgical repair tomorrow.  Based on patient's physical capacity, review of medical problems and EKG report, patient is medically clear for the above mentioned orthopedic procedure.  He is at  low risk for  cardiopulmonary complications. We will keep patient n.p.o. Continue pain control. 2.  Left lung nodules, incidental finding. Few tiny left-sided pulmonary nodules, measuring up to 4 mm in size are noted. Patient is to follow-up with noncontrast chest CT in a year. 3.  Tobacco abuse, smoking cessation was discussed with patient in detail.  All the records are reviewed and case discussed with ED provider. Management plans discussed with the patient, and he is in agreement.  CODE STATUS: FULL    TOTAL TIME TAKING CARE OF THIS PATIENT: 45 minutes.  Cammy CopaAngela Alette Kataoka M.D on 08/23/2017 at 11:57 PM  Between 7am to 6pm - Pager - [phone]  After 6pm go to [email or web address] - password [email or web address]  Fabio Neighborsagle [place]  Office  [phone]  CC: Primary care physician; Patient, No Pcp Per

## 2017-08-23 NOTE — ED Notes (Signed)
Patient transported to CT 

## 2017-08-23 NOTE — Consult Note (Signed)
Patient is a 42 year old black male who was moving a water heater when the downstairs when he fell on him.  He is found to have a left intertrochanteric hip fracture and will require ORIF.  He is normally very active and healthy male. On exam he is neurovascular intact is an abrasion to the dorsum of the left hand left leg is slightly externally rotated pain with any motion skin is intact mild thigh swelling without ecchymosis  CT shows somewhat comminuted fracture of the intertrochanteric region including at the tip of the trochanter  Impression is left intertrochanteric hip fracture Plan is for open reduction internal fixation tomorrow

## 2017-08-23 NOTE — ED Triage Notes (Signed)
Pt BIBEMS post fall that occurred today around 1pm. Pt states he was trying to take a water heater down some stairs however states the water heater fell on top of him and he fell down the stairs. Pt states he does not remember the event, pt states he woke up after incident and water heater was laying beside him. Pt's friends drove pt home, pt took 3 aleve however is not able to bear weight on left leg. Pt states left hip, chest and left collar bone pain. Pt denies head pain, no obvious inj to head noted. Pt A&O at this time.
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
## 2017-08-24 ENCOUNTER — Inpatient Hospital Stay: Payer: Self-pay | Admitting: Anesthesiology

## 2017-08-24 ENCOUNTER — Inpatient Hospital Stay: Payer: Self-pay

## 2017-08-24 ENCOUNTER — Encounter: Payer: Self-pay | Admitting: Anesthesiology

## 2017-08-24 ENCOUNTER — Encounter
Admission: EM | Discharge status: Against Medical Advice | Payer: Self-pay | Source: Home / Self Care | Attending: Internal Medicine

## 2017-08-24 HISTORY — PX: INTRAMEDULLARY (IM) NAIL INTERTROCHANTERIC: SHX5875

## 2017-08-24 LAB — CBC
HCT: 39.2 % — ABNORMAL LOW (ref 40.0–52.0)
Hemoglobin: 13.3 g/dL (ref 13.0–18.0)
MCH: 31.6 pg (ref 26.0–34.0)
MCHC: 34 g/dL (ref 32.0–36.0)
MCV: 93 fL (ref 80.0–100.0)
Platelets: 350 10*3/uL (ref 150–440)
RBC: 4.21 MIL/uL — ABNORMAL LOW (ref 4.40–5.90)
RDW: 13 % (ref 11.5–14.5)
WBC: 13 10*3/uL — ABNORMAL HIGH (ref 3.8–10.6)

## 2017-08-24 LAB — BASIC METABOLIC PANEL
Anion gap: 7 (ref 5–15)
BUN: 13 mg/dL (ref 6–20)
CO2: 26 mmol/L (ref 22–32)
Calcium: 8 mg/dL — ABNORMAL LOW (ref 8.9–10.3)
Chloride: 103 mmol/L (ref 101–111)
Creatinine, Ser: 0.98 mg/dL (ref 0.61–1.24)
GFR calc Af Amer: >60 mL/min (ref >60)
GFR calc non Af Amer: >60 mL/min (ref >60)
Glucose, Bld: 108 mg/dL — ABNORMAL HIGH (ref 65–99)
Potassium: 3.8 mmol/L (ref 3.5–5.1)
Sodium: 136 mmol/L (ref 135–145)

## 2017-08-24 LAB — SURGICAL PCR SCREEN
MRSA, PCR: NEGATIVE
Staphylococcus aureus: NEGATIVE

## 2017-08-24 LAB — GLUCOSE, CAPILLARY: Glucose-Capillary: 109 mg/dL — ABNORMAL HIGH (ref 65–99)

## 2017-08-24 IMAGING — CR DG HIP (WITH PELVIS) OPERATIVE*L*
3 series · 3 of 3 positions shown · non-contrast
Comparison: None.

CLINICAL DATA: ORIF LEFT femur fracture.

EXAM:
OPERATIVE LEFT HIP (WITH PELVIS IF PERFORMED) 3 VIEWS
TECHNIQUE: Fluoroscopic spot image(s) were submitted for interpretation
post-operatively.

[cont. (1 of 3)]
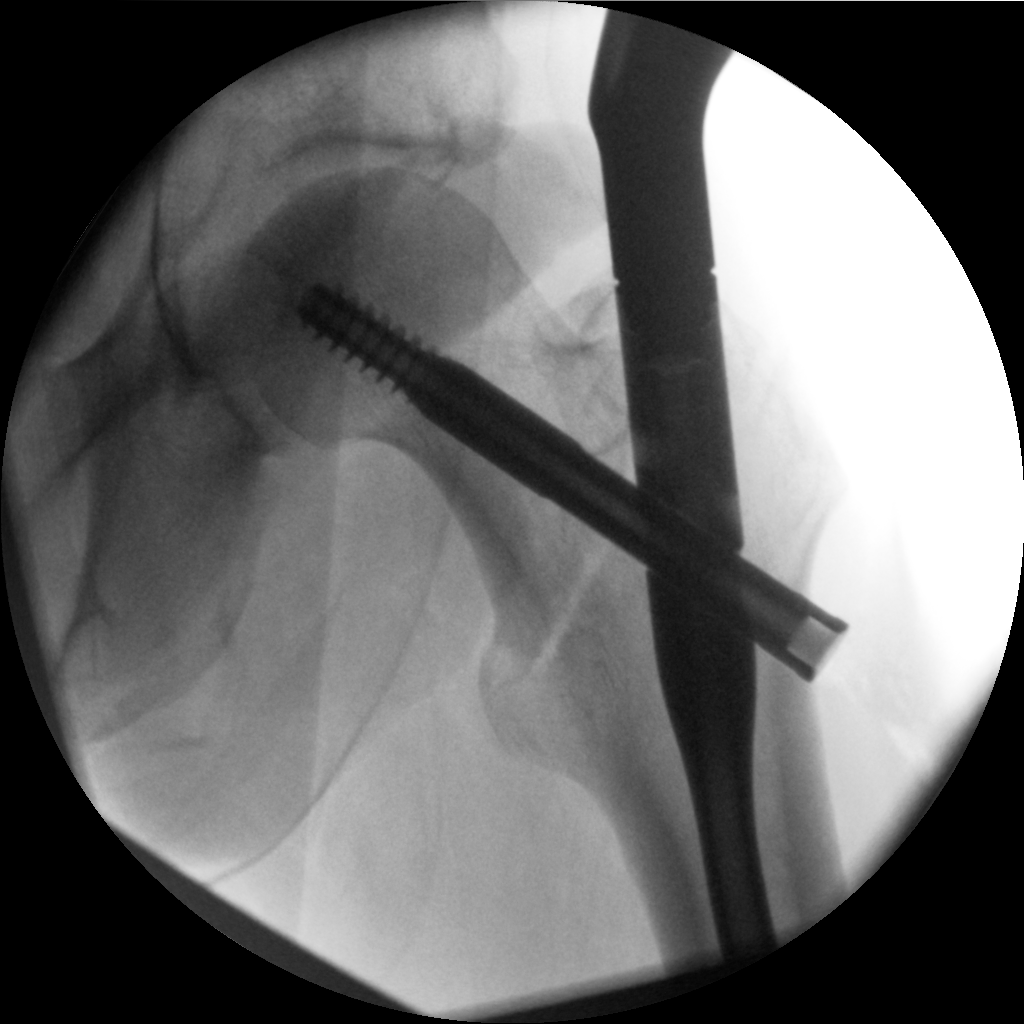

[cont. (2 of 3)]
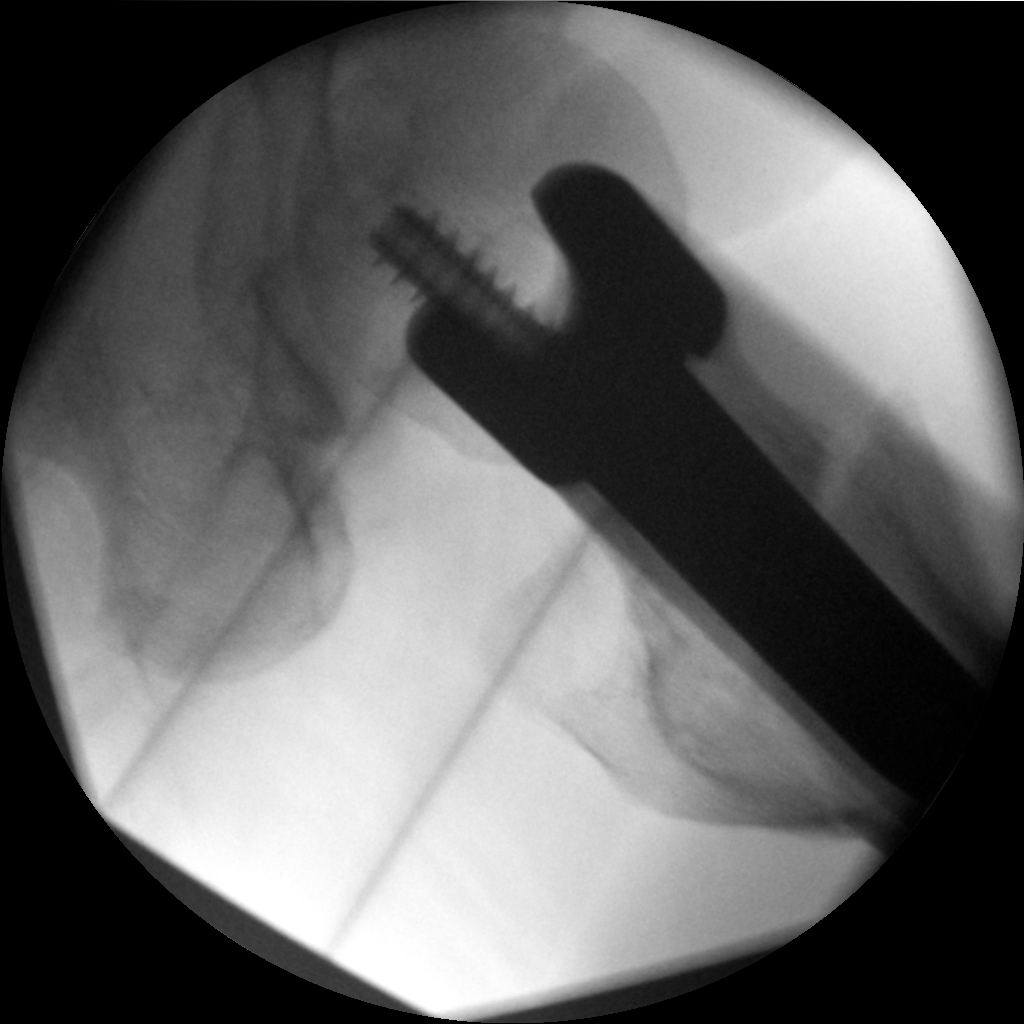

[cont. (3 of 3)]
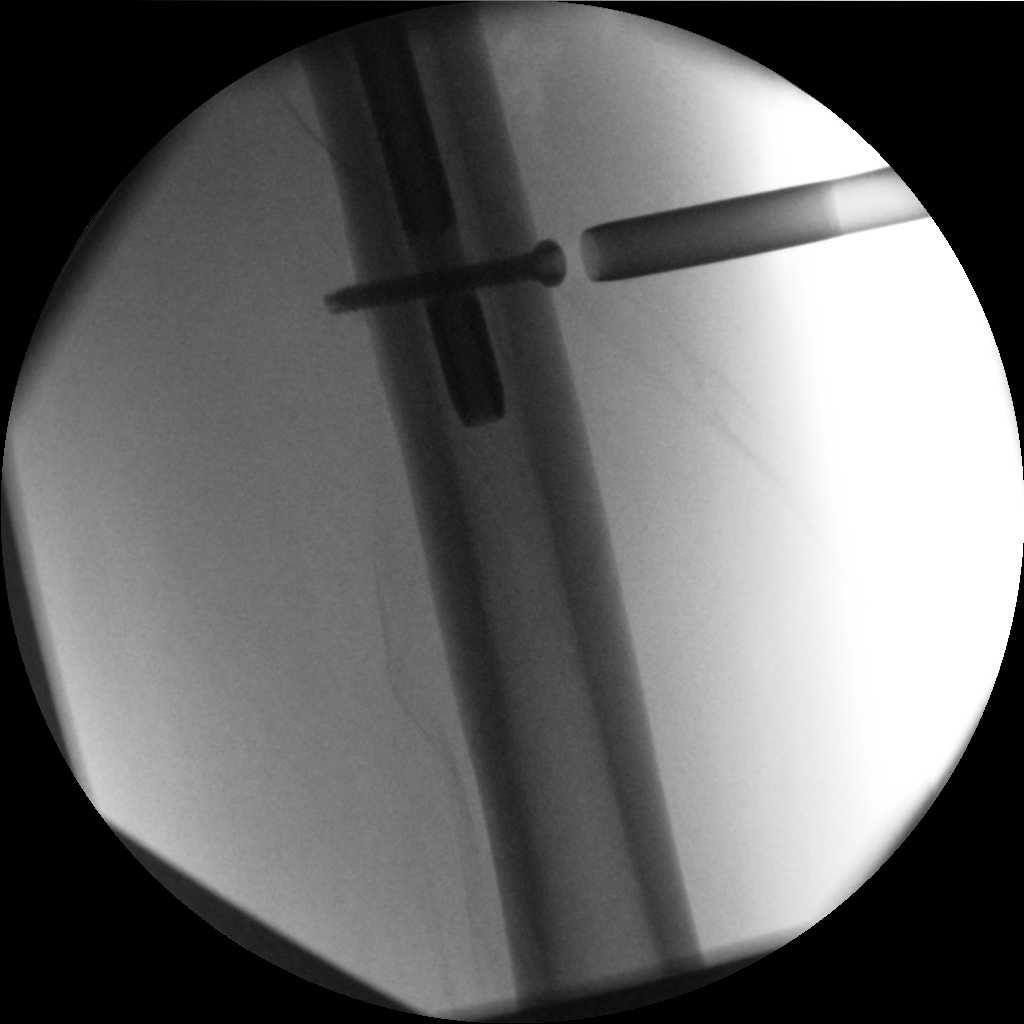

[3 of 3 positions shown; findings below may reference images not displayed]

FINDINGS: Intraoperative views of the LEFT femur demonstrate internal
rod/screw fixation of the femur, traversing a intertrochanteric
fracture, in near-anatomic alignment and position.

No definite complicating features of the visualized hardware noted.
IMPRESSION: ORIF LEFT intertrochanteric femur fracture

## 2017-08-24 SURGERY — FIXATION, FRACTURE, INTERTROCHANTERIC, WITH INTRAMEDULLARY ROD
Anesthesia: Spinal | Site: Hip | Laterality: Left | Wound class: Clean

## 2017-08-24 MED ORDER — OXYCODONE HCL 5 MG PO TABS: 5.0000 mg | ORAL_TABLET | Freq: Once | ORAL | Status: DC | PRN

## 2017-08-24 MED ORDER — FENTANYL CITRATE (PF) 100 MCG/2ML IJ SOLN: 25.0000 ug | INTRAMUSCULAR | Status: DC | PRN

## 2017-08-24 MED ORDER — MIDAZOLAM HCL 5 MG/5ML IJ SOLN
INTRAMUSCULAR | Status: DC | PRN
  Administered 2017-08-24: 2 mg via INTRAVENOUS

## 2017-08-24 MED ORDER — BISACODYL 10 MG RE SUPP: 10.0000 mg | Freq: Every day | RECTAL | Status: DC | PRN

## 2017-08-24 MED ORDER — PROPOFOL 500 MG/50ML IV EMUL
INTRAVENOUS | Status: DC | PRN
  Administered 2017-08-24: 50 ug/kg/min via INTRAVENOUS

## 2017-08-24 MED ORDER — DOCUSATE SODIUM 100 MG PO CAPS
100.0000 mg | ORAL_CAPSULE | Freq: Two times a day (BID) | ORAL | Status: DC
  Administered 2017-08-24 – 2017-08-25 (×2): 100 mg via ORAL
  Filled 2017-08-24 (×2): qty 1

## 2017-08-24 MED ORDER — ONDANSETRON HCL 4 MG/2ML IJ SOLN: 4.0000 mg | Freq: Four times a day (QID) | INTRAMUSCULAR | Status: DC | PRN

## 2017-08-24 MED ORDER — ACETAMINOPHEN 650 MG RE SUPP: 650.0000 mg | Freq: Four times a day (QID) | RECTAL | Status: DC | PRN

## 2017-08-24 MED ORDER — ONDANSETRON HCL 4 MG PO TABS: 4.0000 mg | ORAL_TABLET | Freq: Four times a day (QID) | ORAL | Status: DC | PRN

## 2017-08-24 MED ORDER — OXYCODONE HCL 5 MG/5ML PO SOLN: 5.0000 mg | Freq: Once | ORAL | Status: DC | PRN

## 2017-08-24 MED ORDER — HYDROMORPHONE HCL 1 MG/ML IJ SOLN
1.0000 mg | INTRAMUSCULAR | Status: DC | PRN
  Administered 2017-08-24 – 2017-08-25 (×5): 1 mg via INTRAVENOUS
  Filled 2017-08-24 (×5): qty 1

## 2017-08-24 MED ORDER — PROPOFOL 10 MG/ML IV BOLUS
INTRAVENOUS | Status: AC
Start: 2017-08-24 — End: ?
  Filled 2017-08-24: qty 20

## 2017-08-24 MED ORDER — PHENYLEPHRINE HCL 10 MG/ML IJ SOLN
INTRAMUSCULAR | Status: DC | PRN
  Administered 2017-08-24 (×4): 100 ug via INTRAVENOUS
  Administered 2017-08-24: 200 ug via INTRAVENOUS

## 2017-08-24 MED ORDER — SODIUM CHLORIDE 0.9 % IV SOLN
Freq: Once | INTRAVENOUS | Status: AC
  Administered 2017-08-24: 75 mL/h via INTRAVENOUS

## 2017-08-24 MED ORDER — HYDROCODONE-ACETAMINOPHEN 5-325 MG PO TABS
1.0000 | ORAL_TABLET | ORAL | Status: DC | PRN
  Administered 2017-08-24 – 2017-08-25 (×6): 2 via ORAL
  Filled 2017-08-24 (×7): qty 2

## 2017-08-24 MED ORDER — TRAMADOL HCL 50 MG PO TABS
50.0000 mg | ORAL_TABLET | Freq: Four times a day (QID) | ORAL | Status: DC
  Administered 2017-08-24 – 2017-08-25 (×3): 50 mg via ORAL
  Filled 2017-08-24 (×3): qty 1

## 2017-08-24 MED ORDER — ENOXAPARIN SODIUM 40 MG/0.4ML ~~LOC~~ SOLN
40.0000 mg | SUBCUTANEOUS | Status: DC
  Administered 2017-08-25: 40 mg via SUBCUTANEOUS
  Filled 2017-08-24: qty 0.4

## 2017-08-24 MED ORDER — NICOTINE 21 MG/24HR TD PT24
21.0000 mg | MEDICATED_PATCH | Freq: Every day | TRANSDERMAL | Status: DC
  Administered 2017-08-24 – 2017-08-25 (×2): 21 mg via TRANSDERMAL
  Filled 2017-08-24 (×2): qty 1

## 2017-08-24 MED ORDER — BISACODYL 5 MG PO TBEC: 5.0000 mg | DELAYED_RELEASE_TABLET | Freq: Every day | ORAL | Status: DC | PRN

## 2017-08-24 MED ORDER — ACETAMINOPHEN 500 MG PO TABS
500.0000 mg | ORAL_TABLET | Freq: Four times a day (QID) | ORAL | Status: AC
  Administered 2017-08-24 – 2017-08-25 (×4): 500 mg via ORAL
  Filled 2017-08-24 (×4): qty 1

## 2017-08-24 MED ORDER — METHOCARBAMOL 1000 MG/10ML IJ SOLN
500.0000 mg | Freq: Four times a day (QID) | INTRAVENOUS | Status: DC | PRN
  Filled 2017-08-24: qty 5

## 2017-08-24 MED ORDER — MAGNESIUM CITRATE PO SOLN
1.0000 | Freq: Once | ORAL | Status: DC | PRN
  Filled 2017-08-24: qty 296

## 2017-08-24 MED ORDER — ZOLPIDEM TARTRATE 5 MG PO TABS
5.0000 mg | ORAL_TABLET | Freq: Every evening | ORAL | Status: DC | PRN
  Administered 2017-08-25: 5 mg via ORAL
  Filled 2017-08-24: qty 1

## 2017-08-24 MED ORDER — ALUM & MAG HYDROXIDE-SIMETH 200-200-20 MG/5ML PO SUSP: 30.0000 mL | ORAL | Status: DC | PRN

## 2017-08-24 MED ORDER — PHENOL 1.4 % MT LIQD
1.0000 | OROMUCOSAL | Status: DC | PRN
  Filled 2017-08-24: qty 177

## 2017-08-24 MED ORDER — METOCLOPRAMIDE HCL 10 MG PO TABS: 5.0000 mg | ORAL_TABLET | Freq: Three times a day (TID) | ORAL | Status: DC | PRN

## 2017-08-24 MED ORDER — BUPIVACAINE HCL (PF) 0.5 % IJ SOLN
INTRAMUSCULAR | Status: DC | PRN
  Administered 2017-08-24: 3 mL

## 2017-08-24 MED ORDER — DIPHENHYDRAMINE HCL 25 MG PO CAPS
25.0000 mg | ORAL_CAPSULE | Freq: Four times a day (QID) | ORAL | Status: DC | PRN
  Administered 2017-08-24 – 2017-08-25 (×2): 25 mg via ORAL
  Filled 2017-08-24 (×2): qty 1

## 2017-08-24 MED ORDER — FENTANYL CITRATE (PF) 100 MCG/2ML IJ SOLN
INTRAMUSCULAR | Status: AC
  Filled 2017-08-24: qty 2

## 2017-08-24 MED ORDER — HYDROMORPHONE HCL 1 MG/ML IJ SOLN
2.0000 mg | Freq: Once | INTRAMUSCULAR | Status: AC
  Administered 2017-08-24: 2 mg via SUBCUTANEOUS
  Filled 2017-08-24: qty 2

## 2017-08-24 MED ORDER — SODIUM CHLORIDE 0.9 % IV SOLN: INTRAVENOUS | Status: DC

## 2017-08-24 MED ORDER — DOCUSATE SODIUM 100 MG PO CAPS
100.0000 mg | ORAL_CAPSULE | Freq: Two times a day (BID) | ORAL | Status: DC
  Administered 2017-08-24: 100 mg via ORAL
  Filled 2017-08-24: qty 1

## 2017-08-24 MED ORDER — MAGNESIUM HYDROXIDE 400 MG/5ML PO SUSP: 30.0000 mL | Freq: Every day | ORAL | Status: DC | PRN

## 2017-08-24 MED ORDER — MIDAZOLAM HCL 2 MG/2ML IJ SOLN
INTRAMUSCULAR | Status: AC
  Filled 2017-08-24: qty 2

## 2017-08-24 MED ORDER — MENTHOL 3 MG MT LOZG
1.0000 | LOZENGE | OROMUCOSAL | Status: DC | PRN
  Filled 2017-08-24: qty 9

## 2017-08-24 MED ORDER — CEFAZOLIN SODIUM-DEXTROSE 2-4 GM/100ML-% IV SOLN
2.0000 g | Freq: Four times a day (QID) | INTRAVENOUS | Status: AC
  Administered 2017-08-24 – 2017-08-25 (×3): 2 g via INTRAVENOUS
  Filled 2017-08-24 (×3): qty 100

## 2017-08-24 MED ORDER — METOCLOPRAMIDE HCL 5 MG/ML IJ SOLN: 5.0000 mg | Freq: Three times a day (TID) | INTRAMUSCULAR | Status: DC | PRN

## 2017-08-24 MED ORDER — METHOCARBAMOL 500 MG PO TABS
500.0000 mg | ORAL_TABLET | Freq: Four times a day (QID) | ORAL | Status: DC | PRN
  Administered 2017-08-25: 500 mg via ORAL
  Filled 2017-08-24: qty 1

## 2017-08-24 MED ORDER — NEOMYCIN-POLYMYXIN B GU 40-200000 IR SOLN
Status: DC | PRN
  Administered 2017-08-24: 2 mL

## 2017-08-24 MED ORDER — ACETAMINOPHEN 325 MG PO TABS: 325.0000 mg | ORAL_TABLET | Freq: Four times a day (QID) | ORAL | Status: DC | PRN

## 2017-08-24 MED ORDER — ACETAMINOPHEN 325 MG PO TABS: 650.0000 mg | ORAL_TABLET | Freq: Four times a day (QID) | ORAL | Status: DC | PRN

## 2017-08-24 MED ORDER — PROPOFOL 10 MG/ML IV BOLUS
INTRAVENOUS | Status: DC | PRN
  Administered 2017-08-24: 50 mg via INTRAVENOUS

## 2017-08-24 MED ORDER — LACTATED RINGERS IV SOLN
INTRAVENOUS | Status: DC | PRN
  Administered 2017-08-24: 16:00:00 via INTRAVENOUS

## 2017-08-24 MED ORDER — FENTANYL CITRATE (PF) 100 MCG/2ML IJ SOLN
INTRAMUSCULAR | Status: DC | PRN
  Administered 2017-08-24 (×2): 50 ug via INTRAVENOUS

## 2017-08-24 SURGICAL SUPPLY — 30 items
BIT DRILL CANN LG 4.3MM (BIT) ×1 IMPLANT
CANISTER SUCT 1200ML W/VALVE (MISCELLANEOUS) ×3 IMPLANT
CHLORAPREP W/TINT 26ML (MISCELLANEOUS) ×3 IMPLANT
DRAPE SHEET LG 3/4 BI-LAMINATE (DRAPES) ×3 IMPLANT
DRAPE U-SHAPE 47X51 STRL (DRAPES) ×3 IMPLANT
DRILL BIT CANN LG 4.3MM (BIT) ×3
DRSG OPSITE POSTOP 3X4 (GAUZE/BANDAGES/DRESSINGS) ×3 IMPLANT
GLOVE BIOGEL PI IND STRL 9 (GLOVE) ×1 IMPLANT
GLOVE BIOGEL PI INDICATOR 9 (GLOVE) ×2
GLOVE SURG SYN 9.0  PF PI (GLOVE) ×2
GLOVE SURG SYN 9.0 PF PI (GLOVE) ×1 IMPLANT
GOWN SRG 2XL LVL 4 RGLN SLV (GOWNS) ×1 IMPLANT
GOWN STRL NON-REIN 2XL LVL4 (GOWNS) ×3
GOWN STRL REUS W/ TWL LRG LVL3 (GOWN DISPOSABLE) ×1 IMPLANT
GOWN STRL REUS W/TWL LRG LVL3 (GOWN DISPOSABLE) ×3
GUIDEPIN VERSANAIL DSP 3.2X444 (ORTHOPEDIC DISPOSABLE SUPPLIES) ×3 IMPLANT
HIP FRAC NAIL LAG SCR 10.5X100 (Orthopedic Implant) ×2 IMPLANT
KIT TURNOVER KIT A (KITS) ×3 IMPLANT
MAT BLUE FLOOR 46X72 FLO (MISCELLANEOUS) ×3 IMPLANT
NAIL HIP FRACT 130D 9X180 (Orthopedic Implant) ×3 IMPLANT
NEEDLE FILTER BLUNT 18X 1/2SAF (NEEDLE) ×2
NEEDLE FILTER BLUNT 18X1 1/2 (NEEDLE) ×1 IMPLANT
NS IRRIG 500ML POUR BTL (IV SOLUTION) ×3 IMPLANT
PACK HIP COMPR (MISCELLANEOUS) ×3 IMPLANT
SCREW BONE CORTICAL 5.0X42 (Screw) ×3 IMPLANT
SCREW CANN THRD AFF 10.5X100 (Orthopedic Implant) ×1 IMPLANT
STAPLER SKIN PROX 35W (STAPLE) ×3 IMPLANT
SUT VIC AB 1 CT1 36 (SUTURE) ×3 IMPLANT
SUT VIC AB 2-0 CT1 (SUTURE) ×3 IMPLANT
SYR 10ML LL (SYRINGE) ×3 IMPLANT

## 2017-08-24 NOTE — Anesthesia Procedure Notes (Signed)
Spinal  Patient location during procedure: OR Start time: 08/24/2017 4:40 PM End time: 08/24/2017 4:44 PM Preanesthetic Checklist Completed: patient identified, site marked, surgical consent, pre-op evaluation, timeout performed, IV checked, risks and benefits discussed and monitors and equipment checked Spinal Block Patient position: right lateral decubitus Prep: Betadine Patient monitoring: heart rate, continuous pulse ox, blood pressure and cardiac monitor Approach: midline Location: L4-5 Injection technique: single-shot Needle Needle type: Whitacre and Introducer  Needle gauge: 25 G Needle length: 9 cm Assessment Sensory level: T10 Additional Notes Negative paresthesia. Negative blood return. Positive free-flowing CSF. Expiration date of kit checked and confirmed. Patient tolerated procedure well, without complications.

## 2017-08-24 NOTE — Transfer of Care (Signed)
Immediate Anesthesia Transfer of Care Note  Patient: Benjamin SkyeJames D Obrien  Procedure(s) Performed: INTRAMEDULLARY (IM) NAIL INTERTROCHANTRIC-SHORT AFFIXUS (Left Hip)  Patient Location: PACU  Anesthesia Type:Spinal  Level of Consciousness: awake  Airway & Oxygen Therapy: Patient Spontanous Breathing and Patient connected to face mask oxygen  Post-op Assessment: Report given to RN and Post -op Vital signs reviewed and stable  Post vital signs: Reviewed  Last Vitals:  Vitals Value Taken Time  BP 116/86 08/24/2017  5:34 PM  Temp 37.1 C 08/24/2017  5:34 PM  Pulse 66 08/24/2017  5:34 PM  Resp 10 08/24/2017  5:34 PM  SpO2 99 % 08/24/2017  5:34 PM  Vitals shown include unvalidated device data.  Last Pain:  Vitals:   08/24/17 1312  TempSrc:   PainSc: 8          Complications: No apparent anesthesia complications

## 2017-08-24 NOTE — Op Note (Signed)
08/24/2017  5:29 PM  PATIENT:  Quita SkyeJames D Doeden  42 y.o. male  PRE-OPERATIVE DIAGNOSIS:  left intertrochanteric hip fracture  POST-OPERATIVE DIAGNOSIS:  left intertrochanteric hip fracture  PROCEDURE:  Procedure(s): INTRAMEDULLARY (IM) NAIL INTERTROCHANTRIC-SHORT AFFIXUS (Left)  SURGEON: Leitha SchullerMichael J Nellene Courtois, MD  ASSISTANTS: None  ANESTHESIA:   spinal  EBL:  Total I/O In: 600 [I.V.:600] Out: 200 [Urine:100; Blood:100]  BLOOD ADMINISTERED:none  DRAINS: none   LOCAL MEDICATIONS USED:  NONE  SPECIMEN:  No Specimen  DISPOSITION OF SPECIMEN:  N/A  COUNTS:  YES  TOURNIQUET:  * No tourniquets in log *  IMPLANTS: Biomet affixes short 9 x 130 degree rod with 100 mm lag screw and 42 mm interlocking screw  DICTATION: .Dragon Dictation patient was brought to the operating room and after adequate anesthesia was obtained the patient was transferred to the fracture table.  Right leg was put in a well-leg holder left foot in the traction boot and traction applied with C-arm brought in anatomic alignment was obtained.  After prepping and draping in usual sterile manner using a barrier drape technique, appropriate patient identification and timeout procedures were completed.  A proximal incision was made and a guidewire centered on the trochanter and advanced into the proximal canal where reaming was carried out.  The 9 x 130 degree rod was inserted to the appropriate depth and a small lateral incision was made and a guidewire inserted through the femoral neck near the center center head.  This was measured drilled and tapped in the 100 mm leg screw inserted with compression applied after releasing traction on the foot.  There is good compression at the fracture site.  This was tightened proximally with a quarter turn loosening to allow for further compression with weightbearing.  Next the distal interlocking screw was placed using standard technique of drilling measuring and placing the 5.0 cortical  screw.  Permanent serum views were obtained and almost mentation for insertion was removed the wounds were irrigated and then closed with #1 Vicryl for the deep fascia 2-0 Vicryl subcutaneously and skin staples followed by honeycomb dressings  PLAN OF CARE: Continue as inpatient  PATIENT DISPOSITION:  PACU - hemodynamically stable.

## 2017-08-24 NOTE — Anesthesia Post-op Follow-up Note (Signed)
Anesthesia QCDR form completed.        

## 2017-08-24 NOTE — ED Notes (Signed)
Pt transported to Rm 145

## 2017-08-24 NOTE — Progress Notes (Signed)
Report called by ED RN Rosanne SackKasey. Pt needs a low bed (high fall risk). Awaiting for low bed this time. [employer] notified prior to approving pt.

## 2017-08-24 NOTE — NC FL2 (Signed)
[place] MEDICAID FL2 LEVEL OF CARE SCREENING TOOL     IDENTIFICATION  Patient Name: Benjamin LombardJames D Rayfield Birthdate: December 30, 1975 Sex: male Admission Date (Current Location): 08/23/2017  [place]   Facility and Address:  [facility], [address], [place], [place] [address]      Provider Number: [id number]  Attending Physician Name and Address:  Auburn BilberryPatel, Shreyang, MD  Relative Name and Phone Number:       Current Level of Care: Hospital Recommended Level of Care: Skilled Nursing Facility Prior Approval Number:    Date Approved/Denied:   PASRR Number: ([id number])  Discharge Plan: SNF    Current Diagnoses: Patient Active Problem List   Diagnosis Date Noted  . Hip fracture (HCC) 08/23/2017    Orientation RESPIRATION BLADDER Height & Weight     Self, Time, Situation, Place  Normal Continent Weight: 145 lb 8.1 oz (66 kg) Height:  5\' 7"  (170.2 cm)  BEHAVIORAL SYMPTOMS/MOOD NEUROLOGICAL BOWEL NUTRITION STATUS      Continent Diet(Diet: NPO for surgery to be advanced. )  AMBULATORY STATUS COMMUNICATION OF NEEDS Skin   Extensive Assist Verbally Surgical wounds                       Personal Care Assistance Level of Assistance  Bathing, Feeding, Dressing Bathing Assistance: Limited assistance Feeding assistance: Independent Dressing Assistance: Limited assistance     Functional Limitations Info  Sight, Hearing, Speech Sight Info: Adequate Hearing Info: Adequate Speech Info: Adequate    SPECIAL CARE FACTORS FREQUENCY  PT (By licensed PT), OT (By licensed OT)     PT Frequency: (5) OT Frequency: (5)            Contractures      Additional Factors Info  Code Status, Allergies Code Status Info: (Full Code. ) Allergies Info: (No Known Allergies. )           Current Medications (08/24/2017):  This is the current hospital active medication list Current Facility-Administered Medications  Medication  Dose Route Frequency Provider Last Rate Last Dose  . acetaminophen (TYLENOL) tablet 650 mg  650 mg Oral Q6H PRN Cammy CopaMaier, Angela, MD       Or  . acetaminophen (TYLENOL) suppository 650 mg  650 mg Rectal Q6H PRN Cammy CopaMaier, Angela, MD      . bisacodyl (DULCOLAX) EC tablet 5 mg  5 mg Oral Daily PRN Cammy CopaMaier, Angela, MD      . ceFAZolin (ANCEF) IVPB 1 g/50 mL premix  1 g Intravenous On Call to OR Cammy CopaMaier, Angela, MD      . docusate sodium (COLACE) capsule 100 mg  100 mg Oral BID Cammy CopaMaier, Angela, MD   100 mg at 08/24/17 0253  . HYDROcodone-acetaminophen (NORCO/VICODIN) 5-325 MG per tablet 1-2 tablet  1-2 tablet Oral Q4H PRN Cammy CopaMaier, Angela, MD   2 tablet at 08/24/17 0607  . HYDROmorphone (DILAUDID) injection 1 mg  1 mg Intravenous Q4H PRN Cammy CopaMaier, Angela, MD   1 mg at 08/24/17 0929  . ondansetron (ZOFRAN) tablet 4 mg  4 mg Oral Q6H PRN Cammy CopaMaier, Angela, MD       Or  . ondansetron [facility](ZOFRAN) injection 4 mg  4 mg Intravenous Q6H PRN Cammy CopaMaier, Angela, MD         Discharge Medications: Please see discharge summary for a list of discharge medications.  Relevant Imaging Results:  Relevant Lab Results:   Additional Information (SSN: [id number] )  Geeta Dworkin, Darleen CrockerBailey M, LCSW
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
## 2017-08-24 NOTE — Anesthesia Preprocedure Evaluation (Addendum)
Anesthesia Evaluation  Patient identified by MRN, date of birth, ID band Patient awake    Reviewed: Allergy & Precautions, H&P , NPO status , Patient's Chart, lab work & pertinent test results  History of Anesthesia Complications Negative for: history of anesthetic complications  Airway Mallampati: I  TM Distance: >3 FB Neck ROM: full    Dental  (+) Chipped, Poor Dentition   Pulmonary neg shortness of breath, Current Smoker,           Cardiovascular Exercise Tolerance: Good (-) angina(-) Past MI and (-) DOE negative cardio ROS       Neuro/Psych negative neurological ROS  negative psych ROS   GI/Hepatic negative GI ROS, Neg liver ROS, neg GERD  ,  Endo/Other  diabetes, Type 2  Renal/GU      Musculoskeletal   Abdominal   Peds  Hematology negative hematology ROS (+)   Anesthesia Other Findings Past Medical History: No date: Pancreatitis  Past Surgical History: No date: TOOTH EXTRACTION  BMI    Body Mass Index:  22.79 kg/m      Reproductive/Obstetrics negative OB ROS                            Anesthesia Physical Anesthesia Plan  ASA: III  Anesthesia Plan: Spinal   Post-op Pain Management:    Induction:   PONV Risk Score and Plan:   Airway Management Planned: Natural Airway and Nasal Cannula  Additional Equipment:   Intra-op Plan:   Post-operative Plan:   Informed Consent: I have reviewed the patients History and Physical, chart, labs and discussed the procedure including the risks, benefits and alternatives for the proposed anesthesia with the patient or authorized representative who has indicated his/her understanding and acceptance.   Dental Advisory Given  Plan Discussed with: Anesthesiologist, CRNA and Surgeon  Anesthesia Plan Comments: (Patient reports no bleeding problems and no anticoagulant use.  Plan for spinal with backup GA  Patient consented for  risks of anesthesia including but not limited to:  - adverse reactions to medications - risk of bleeding, infection, nerve damage and headache - risk of failed spinal - damage to teeth, lips or other oral mucosa - sore throat or hoarseness - Damage to heart, brain, lungs or loss of life  Patient voiced understanding.)        Anesthesia Quick Evaluation

## 2017-08-24 NOTE — Progress Notes (Signed)
[employer] [facility] [place] [facility]                                                                                                                                                                                  Patient Demographics   Ruffus Kamaka, is a 42 y.o. male, DOB - 1975/07/25, ZOX:[id number]  Admit date - 08/23/2017   Admitting Physician Cammy Copa, MD  Outpatient Primary MD for the patient is Patient, No Pcp Per   LOS - 1  Subjective: Pt c/o severe leg pain    Review of Systems:   CONSTITUTIONAL: No documented fever. No fatigue, weakness. No weight gain, no weight loss.  EYES: No blurry or double vision.  ENT: No tinnitus. No postnasal drip. No redness of the oropharynx.  RESPIRATORY: No cough, no wheeze, no hemoptysis. No dyspnea.  CARDIOVASCULAR: No chest pain. No orthopnea. No palpitations. No syncope.  GASTROINTESTINAL: No nausea, no vomiting or diarrhea. No abdominal pain. No melena or hematochezia.  GENITOURINARY: No dysuria or hematuria.  ENDOCRINE: No polyuria or nocturia. No heat or cold intolerance.  HEMATOLOGY: No anemia. No bruising. No bleeding.  INTEGUMENTARY: No rashes. No lesions.  Positive leg pain MUSCULOSKELETAL: No arthritis. No swelling. No gout. + severe  NEUROLOGIC: No numbness, tingling, or ataxia. No seizure-type activity.  PSYCHIATRIC: No anxiety. No insomnia. No ADD.    Vitals:   Vitals:   08/23/17 2230 08/23/17 2300 08/24/17 0500 08/24/17 0846  BP: 117/84 119/81  (!) 123/91  Pulse:    67  Resp: 18 16  20   Temp:    97.6 F (36.4 C)  TempSrc:      SpO2:    100%  Weight:   66 kg (145 lb 8.1 oz)   Height:        Wt Readings from Last 3 Encounters:  08/24/17 66 kg (145 lb 8.1 oz)  01/28/15 66.7 kg (147 lb)  12/22/14 72.6 kg (160 lb)     Intake/Output Summary (Last 24 hours) at 08/24/2017 1416 Last data filed at 08/24/2017 0841 Gross per 24 hour  Intake 240 ml  Output 500 ml  Net -260 ml    Physical Exam:    GENERAL: Pleasant-appearing in no apparent distress.  HEAD, EYES, EARS, NOSE AND THROAT: Atraumatic, normocephalic. Extraocular muscles are intact. Pupils equal and reactive to light. Sclerae anicteric. No conjunctival injection. No oro-pharyngeal erythema.  NECK: Supple. There is no jugular venous distention. No bruits, no lymphadenopathy, no thyromegaly.  HEART: Regular rate and rhythm,. No murmurs, no rubs, no clicks.  LUNGS: Clear to auscultation bilaterally. No rales or rhonchi. No wheezes.  ABDOMEN: Soft, flat, nontender, nondistended. Has good bowel  sounds. No hepatosplenomegaly appreciated.  EXTREMITIES: No evidence of any cyanosis, clubbing, or peripheral edema.  +2 pedal and radial pulses bilaterally.  NEUROLOGIC: The patient is alert, awake, and oriented x3 with no focal motor or sensory deficits appreciated bilaterally.  SKIN: Moist and warm with no rashes appreciated.  Psych: Not anxious, depressed LN: No inguinal LN enlargement    Antibiotics   Anti-infectives (From admission, onward)   Start     Dose/Rate Route Frequency Ordered Stop   08/24/17 1400  ceFAZolin (ANCEF) powder 1 g  Status:  Discontinued     1 g Other To Surgery 08/23/17 2253 08/23/17 2350   08/24/17 1400  ceFAZolin (ANCEF) IVPB 1 g/50 mL premix     1 g 100 mL/hr over 30 Minutes Intravenous On call to O.R. 08/23/17 2350 08/25/17 1359      Medications   Scheduled Meds: . docusate sodium  100 mg Oral BID  . nicotine  21 mg Transdermal Daily   Continuous Infusions: .  ceFAZolin (ANCEF) IV     PRN Meds:.acetaminophen **OR** acetaminophen, bisacodyl, diphenhydrAMINE, HYDROcodone-acetaminophen, HYDROmorphone (DILAUDID) injection, ondansetron **OR** ondansetron (ZOFRAN) IV   Data Review:   Micro Results Recent Results (from the past 240 hour(s))  Surgical pcr screen     Status: Abnormal   Collection Time: 08/24/17  3:14 AM  Result Value Ref Range Status   MRSA, PCR (A) NEGATIVE Final    INVALID,  UNABLE TO DETERMINE THE PRESENCE OF TARGET DNA DUE TO SPECIMEN INTEGRITY. RECOLLECTION REQUESTED.   Staphylococcus aureus (A) NEGATIVE Final    INVALID, UNABLE TO DETERMINE THE PRESENCE OF TARGET DNA DUE TO SPECIMEN INTEGRITY. RECOLLECTION REQUESTED.    CommentFeliberto Harts AT 4098 ON 08/24/2017 JJB Performed at [facility], [address] [place], [place] [address]   Surgical pcr screen     Status: None   Collection Time: 08/24/17  6:55 AM  Result Value Ref Range Status   MRSA, PCR NEGATIVE NEGATIVE Final   Staphylococcus aureus NEGATIVE NEGATIVE Final    Comment: (NOTE) The Xpert SA Assay (FDA approved for NASAL specimens in patients 82 years of age and older), is one component of a comprehensive surveillance program. It is not intended to diagnose infection nor to guide or monitor treatment. Performed at [facility], [address] [place], [place] [address]     Radiology Reports Ct Head Wo Contrast  Result Date: 08/23/2017 CLINICAL DATA:  Fall down stairs while carrying water heater, water heater fell on patient. EXAM: CT HEAD WITHOUT CONTRAST CT CERVICAL SPINE WITHOUT CONTRAST TECHNIQUE: Multidetector CT imaging of the head and cervical spine was performed following the standard protocol without intravenous contrast. Multiplanar CT image reconstructions of the cervical spine were also generated. COMPARISON:  None. FINDINGS: CT HEAD FINDINGS Brain: No intracranial hemorrhage, mass effect, or midline shift. No hydrocephalus. The basilar cisterns are patent. No evidence of territorial infarct or acute ischemia. No extra-axial or intracranial fluid collection. Vascular: No hyperdense vessel or unexpected calcification. Skull: No fracture or focal lesion. Sinuses/Orbits: Paranasal sinuses and mastoid air cells are clear. The visualized orbits are unremarkable. Other: None. CT CERVICAL SPINE FINDINGS Alignment: Straightening of normal lordosis. No traumatic  subluxation. Skull base and vertebrae: No acute fracture. Vertebral body heights are maintained. The dens and skull base are intact. Soft tissues and spinal canal: No prevertebral fluid or swelling. No visible canal hematoma. Disc levels: Disc space narrowing and endplate spurring most prominent at C4-C5 and C5-C6. Upper chest:  No apical pneumothorax or acute findings. Other: None. IMPRESSION: 1.  No acute intracranial abnormality.  No skull fracture. 2. Mild degenerative change in the cervical spine without acute fracture. Electronically Signed   By: Rubye OaksMelanie  Ehinger M.D.   On: 08/23/2017 21:52   Ct Chest W Contrast  Result Date: 08/23/2017 CLINICAL DATA:  Status post fall down stairs, with water heater falling on patient. Left hip and chest pain. Left clavicular pain. EXAM: CT CHEST, ABDOMEN, AND PELVIS WITH CONTRAST TECHNIQUE: Multidetector CT imaging of the chest, abdomen and pelvis was performed following the standard protocol during bolus administration of intravenous contrast. CONTRAST:  100mL ISOVUE-370 IOPAMIDOL (ISOVUE-370) INJECTION 76% COMPARISON:  CT of the abdomen and pelvis performed 01/28/2015 FINDINGS: CT CHEST FINDINGS Cardiovascular: The heart is normal in size. There is no evidence of aortic injury. The thoracic aorta is unremarkable. The great vessels are within normal limits. There is no evidence of venous hemorrhage. Mediastinum/Nodes: The mediastinum is unremarkable. No mediastinal lymphadenopathy is seen. No pericardial effusion is identified. The thyroid gland is unremarkable. No axillary lymphadenopathy is appreciated. Lungs/Pleura: A bleb is noted at the medial aspect of the left lung. A few tiny left-sided pulmonary nodules are seen measuring up to 4 mm in size. The lungs are otherwise clear. There is no evidence of focal consolidation, pleural effusion or pneumothorax. There is no evidence of pulmonary parenchymal contusion. Musculoskeletal: No acute osseous abnormalities are  identified. The visualized musculature is unremarkable in appearance. CT ABDOMEN PELVIS FINDINGS Hepatobiliary: The liver is unremarkable in appearance. The gallbladder is unremarkable in appearance. The common bile duct remains normal in caliber. Pancreas: The pancreas is within normal limits. Spleen: The spleen is unremarkable in appearance. Adrenals/Urinary Tract: The adrenal glands are unremarkable in appearance. The kidneys are within normal limits. There is no evidence of hydronephrosis. No renal or ureteral stones are identified. No perinephric stranding is seen. Stomach/Bowel: Vague soft tissue inflammation and fluid is noted about the first segment of the duodenum, adjacent to the gallbladder. Would correlate clinically for evidence of duodenitis, and consider endoscopy to assess for underlying ulceration. This is slightly more proximal than the inflammation seen in 2016. The stomach is grossly unremarkable in appearance. The small bowel is unremarkable. The stomach is unremarkable in appearance. The small bowel is within normal limits. The appendix is normal in caliber, without evidence of appendicitis. The colon is unremarkable in appearance. Vascular/Lymphatic: The abdominal aorta is unremarkable in appearance. Mild calcification is noted along the common iliac arteries bilaterally. The inferior vena cava is grossly unremarkable. No retroperitoneal lymphadenopathy is seen. No pelvic sidewall lymphadenopathy is identified. Reproductive: The bladder is mildly distended and grossly unremarkable. The prostate is normal in size. Other: No additional soft tissue abnormalities are seen. Musculoskeletal: There is a comminuted left femoral intertrochanteric fracture, with greater and lesser trochanteric fragments. Diffuse soft tissue edema is noted along the left quadriceps and left iliacus musculature, concerning for acute traumatic injury. IMPRESSION: 1. Comminuted left femoral intertrochanteric fracture, with  greater and lesser trochanteric fragments. 2. Diffuse soft tissue injury involving the left quadriceps and left iliacus musculature. 3. Vague soft tissue inflammation and fluid about the first segment of the duodenum, adjacent to the gallbladder. Would correlate clinically for evidence of duodenitis, and consider endoscopy to assess for underlying ulceration. This is slightly more proximal than the inflammation seen in 2016. 4. Few tiny left-sided pulmonary nodules, measuring up to 4 mm in size. No follow-up needed if patient is low-risk (and has no known  or suspected primary neoplasm). Non-contrast chest CT can be considered in 12 months if patient is high-risk. This recommendation follows the consensus statement: Guidelines for Management of Incidental Pulmonary Nodules Detected on CT Images: From the Fleischner Society 2017; Radiology 2017; 284:228-243. Electronically Signed   By: Roanna Raider M.D.   On: 08/23/2017 21:56   Ct Cervical Spine Wo Contrast  Result Date: 08/23/2017 CLINICAL DATA:  Fall down stairs while carrying water heater, water heater fell on patient. EXAM: CT HEAD WITHOUT CONTRAST CT CERVICAL SPINE WITHOUT CONTRAST TECHNIQUE: Multidetector CT imaging of the head and cervical spine was performed following the standard protocol without intravenous contrast. Multiplanar CT image reconstructions of the cervical spine were also generated. COMPARISON:  None. FINDINGS: CT HEAD FINDINGS Brain: No intracranial hemorrhage, mass effect, or midline shift. No hydrocephalus. The basilar cisterns are patent. No evidence of territorial infarct or acute ischemia. No extra-axial or intracranial fluid collection. Vascular: No hyperdense vessel or unexpected calcification. Skull: No fracture or focal lesion. Sinuses/Orbits: Paranasal sinuses and mastoid air cells are clear. The visualized orbits are unremarkable. Other: None. CT CERVICAL SPINE FINDINGS Alignment: Straightening of normal lordosis. No traumatic  subluxation. Skull base and vertebrae: No acute fracture. Vertebral body heights are maintained. The dens and skull base are intact. Soft tissues and spinal canal: No prevertebral fluid or swelling. No visible canal hematoma. Disc levels: Disc space narrowing and endplate spurring most prominent at C4-C5 and C5-C6. Upper chest: No apical pneumothorax or acute findings. Other: None. IMPRESSION: 1.  No acute intracranial abnormality.  No skull fracture. 2. Mild degenerative change in the cervical spine without acute fracture. Electronically Signed   By: Rubye Oaks M.D.   On: 08/23/2017 21:52   Ct Abdomen Pelvis W Contrast  Result Date: 08/23/2017 CLINICAL DATA:  Status post fall down stairs, with water heater falling on patient. Left hip and chest pain. Left clavicular pain. EXAM: CT CHEST, ABDOMEN, AND PELVIS WITH CONTRAST TECHNIQUE: Multidetector CT imaging of the chest, abdomen and pelvis was performed following the standard protocol during bolus administration of intravenous contrast. CONTRAST:  ISOVUE-370 IOPAMIDOL (ISOVUE-370) INJECTION 76% COMPARISON:  CT of the abdomen and pelvis performed 01/28/2015 FINDINGS: CT CHEST FINDINGS Cardiovascular: The heart is normal in size. There is no evidence of aortic injury. The thoracic aorta is unremarkable. The great vessels are within normal limits. There is no evidence of venous hemorrhage. Mediastinum/Nodes: The mediastinum is unremarkable. No mediastinal lymphadenopathy is seen. No pericardial effusion is identified. The thyroid gland is unremarkable. No axillary lymphadenopathy is appreciated. Lungs/Pleura: A bleb is noted at the medial aspect of the left lung. A few tiny left-sided pulmonary nodules are seen measuring up to 4 mm in size. The lungs are otherwise clear. There is no evidence of focal consolidation, pleural effusion or pneumothorax. There is no evidence of pulmonary parenchymal contusion. Musculoskeletal: No acute osseous abnormalities  are identified. The visualized musculature is unremarkable in appearance. CT ABDOMEN PELVIS FINDINGS Hepatobiliary: The liver is unremarkable in appearance. The gallbladder is unremarkable in appearance. The common bile duct remains normal in caliber. Pancreas: The pancreas is within normal limits. Spleen: The spleen is unremarkable in appearance. Adrenals/Urinary Tract: The adrenal glands are unremarkable in appearance. The kidneys are within normal limits. There is no evidence of hydronephrosis. No renal or ureteral stones are identified. No perinephric stranding is seen. Stomach/Bowel: Vague soft tissue inflammation and fluid is noted about the first segment of the duodenum, adjacent to the gallbladder. Would correlate clinically for  evidence of duodenitis, and consider endoscopy to assess for underlying ulceration. This is slightly more proximal than the inflammation seen in 2016. The stomach is grossly unremarkable in appearance. The small bowel is unremarkable. The stomach is unremarkable in appearance. The small bowel is within normal limits. The appendix is normal in caliber, without evidence of appendicitis. The colon is unremarkable in appearance. Vascular/Lymphatic: The abdominal aorta is unremarkable in appearance. Mild calcification is noted along the common iliac arteries bilaterally. The inferior vena cava is grossly unremarkable. No retroperitoneal lymphadenopathy is seen. No pelvic sidewall lymphadenopathy is identified. Reproductive: The bladder is mildly distended and grossly unremarkable. The prostate is normal in size. Other: No additional soft tissue abnormalities are seen. Musculoskeletal: There is a comminuted left femoral intertrochanteric fracture, with greater and lesser trochanteric fragments. Diffuse soft tissue edema is noted along the left quadriceps and left iliacus musculature, concerning for acute traumatic injury. IMPRESSION: 1. Comminuted left femoral intertrochanteric fracture,  with greater and lesser trochanteric fragments. 2. Diffuse soft tissue injury involving the left quadriceps and left iliacus musculature. 3. Vague soft tissue inflammation and fluid about the first segment of the duodenum, adjacent to the gallbladder. Would correlate clinically for evidence of duodenitis, and consider endoscopy to assess for underlying ulceration. This is slightly more proximal than the inflammation seen in 2016. 4. Few tiny left-sided pulmonary nodules, measuring up to 4 mm in size. No follow-up needed if patient is low-risk (and has no known or suspected primary neoplasm). Non-contrast chest CT can be considered in 12 months if patient is high-risk. This recommendation follows the consensus statement: Guidelines for Management of Incidental Pulmonary Nodules Detected on CT Images: From the Fleischner Society 2017; Radiology 2017; 284:228-243. Electronically Signed   By: Roanna Raider M.D.   On: 08/23/2017 21:56   Ct Femur Left Wo Contrast  Result Date: 08/23/2017 CLINICAL DATA:  Left hip pain after fall. EXAM: CT OF THE LOWER LEFT EXTREMITY WITHOUT CONTRAST TECHNIQUE: Multidetector CT imaging of the lower left extremity was performed according to the standard protocol. COMPARISON:  CT abdomen pelvis dated January 28, 2015. FINDINGS: Bones/Joint/Cartilage Acute, comminuted, minimally displaced left intertrochanteric femur fracture. No additional fracture. No dislocation. The knee and hip joint spaces are preserved. No joint effusion. Bone mineralization is normal. Ligaments Suboptimally assessed by CT. Muscles and Tendons Unremarkable.  The iliopsoas tendon appears intact. Soft tissues Small amount of soft tissue hematoma surrounding the fracture. IMPRESSION: 1. Acute, comminuted, minimally displaced left intertrochanteric femur fracture. Electronically Signed   By: Obie Dredge M.D.   On: 08/23/2017 21:51   Dg Hand Complete Left  Result Date: 08/23/2017 CLINICAL DATA:  Status post fall  down stairs, with acute onset of left hand pain. Initial encounter. EXAM: LEFT HAND - COMPLETE 3+ VIEW COMPARISON:  None. FINDINGS: There is no evidence of fracture or dislocation. The joint spaces are preserved. The carpal rows are intact, and demonstrate normal alignment. The soft tissues are unremarkable in appearance. IMPRESSION: No evidence of fracture or dislocation. Electronically Signed   By: Roanna Raider M.D.   On: 08/23/2017 22:50     CBC Recent Labs  Lab 08/23/17 2025 08/24/17 0510  WBC 16.7* 13.0*  HGB 13.7 13.3  HCT 40.0 39.2*  PLT 378 350  MCV 92.8 93.0  MCH 31.8 31.6  MCHC 34.3 34.0  RDW 13.1 13.0    Chemistries  Recent Labs  Lab 08/23/17 2025 08/24/17 0510  NA 133* 136  K 4.1 3.8  CL 101 103  CO2 19* 26  GLUCOSE 141* 108*  BUN 12 13  CREATININE 0.91 0.98  CALCIUM 8.2* 8.0*  AST 39  --   ALT 25  --   ALKPHOS 41  --   BILITOT 0.5  --    ------------------------------------------------------------------------------------------------------------------ estimated creatinine clearance is 91.7 mL/min (by C-G formula based on SCr of 0.98 mg/dL). ------------------------------------------------------------------------------------------------------------------ No results for input(s): HGBA1C in the last 72 hours. ------------------------------------------------------------------------------------------------------------------ No results for input(s): CHOL, HDL, LDLCALC, TRIG, CHOLHDL, LDLDIRECT in the last 72 hours. ------------------------------------------------------------------------------------------------------------------ No results for input(s): TSH, T4TOTAL, T3FREE, THYROIDAB in the last 72 hours.  Invalid input(s): FREET3 ------------------------------------------------------------------------------------------------------------------ No results for input(s): VITAMINB12, FOLATE, FERRITIN, TIBC, IRON, RETICCTPCT in the last 72 hours.  Coagulation  profile Recent Labs  Lab 08/23/17 2025  INR 0.96    No results for input(s): DDIMER in the last 72 hours.  Cardiac Enzymes No results for input(s): CKMB, TROPONINI, MYOGLOBIN in the last 168 hours.  Invalid input(s): CK ------------------------------------------------------------------------------------------------------------------ Invalid input(s): POCBNP    Assessment & Plan  Patient is 42 year old presenting with left hip pain  1.  Left hip fracture, plan for patient to have surgery later today no cardiopulmonary work-up needed 2.  Left lung nodules, incidental finding. Few tiny left-sided pulmonary nodules, measuring up to 4 mm in size are noted. Patient is to follow-up with noncontrast chest CT in a year. 3.  Tobacco abuse, s smoking cessation provided 4 minutes spent recommended to stop smoking nicotine patch offered       Code Status Orders  (From admission, onward)        Start     Ordered   08/24/17 0212  Full code  Continuous     08/24/17 0212    Code Status History    This patient has a current code status but no historical code status.           Consults orthopedic  DVT Prophylaxis SCDs until surgery  Lab Results  Component Value Date   PLT 350 08/24/2017     Time Spent in minutes   35 minutes greater than 50% of time spent in care coordination and counseling patient regarding the condition and plan of care.   Auburn Bilberry M.D on 08/24/2017 at 2:16 PM  Between 7am to 6pm - Pager - [phone]  After 6pm go to [email or web address] - Social research officer, government  [employer]   Office  [phone]

## 2017-08-25 ENCOUNTER — Encounter: Payer: Self-pay | Admitting: Orthopedic Surgery

## 2017-08-25 LAB — BASIC METABOLIC PANEL
Anion gap: 5 (ref 5–15)
BUN: 9 mg/dL (ref 6–20)
CO2: 27 mmol/L (ref 22–32)
Calcium: 7.9 mg/dL — ABNORMAL LOW (ref 8.9–10.3)
Chloride: 104 mmol/L (ref 101–111)
Creatinine, Ser: 0.8 mg/dL (ref 0.61–1.24)
GFR calc Af Amer: >60 mL/min (ref >60)
GFR calc non Af Amer: >60 mL/min (ref >60)
Glucose, Bld: 106 mg/dL — ABNORMAL HIGH (ref 65–99)
Potassium: 4.2 mmol/L (ref 3.5–5.1)
Sodium: 136 mmol/L (ref 135–145)

## 2017-08-25 LAB — CBC
HCT: 35.4 % — ABNORMAL LOW (ref 40.0–52.0)
Hemoglobin: 12.2 g/dL — ABNORMAL LOW (ref 13.0–18.0)
MCH: 32.2 pg (ref 26.0–34.0)
MCHC: 34.5 g/dL (ref 32.0–36.0)
MCV: 93.3 fL (ref 80.0–100.0)
Platelets: 292 10*3/uL (ref 150–440)
RBC: 3.79 MIL/uL — ABNORMAL LOW (ref 4.40–5.90)
RDW: 12.9 % (ref 11.5–14.5)
WBC: 10.5 10*3/uL (ref 3.8–10.6)

## 2017-08-25 LAB — HIV ANTIBODY (ROUTINE TESTING W REFLEX): HIV Screen 4th Generation wRfx: NONREACTIVE

## 2017-08-25 NOTE — Evaluation (Signed)
Physical Therapy Evaluation Patient Details Name: Benjamin Obrien MRN: [id number] DOB: 12/10/1975 Today's Date: 08/25/2017   History of Present Illness  Pt post ORIF on L hip 08/24/17 for L hip fracture. Surgical history include endoscopy on 04/28/17. PMH includes pancreatitis.  Clinical Impression  Prior to hospital admission, pt was independent with all functional mobility.  Pt lives alone in a 1-story home with 1 stair for entry.  Currently pt is supervision assist for bedside mobility. He is able to perform supine to/from sit on his own, but feels less pain and takes less time with someone supporting his L LE. He is experiencing pain in his L proximal clavicular area when he raises his L UE past 90 degrees abduction and bears weight through his L UE. His L proximal clavicular area was noted to be swollen and tender to touch (nursing notified). Deferred further mobility d/t left clavicular area pain concerns. Pt would benefit from skilled PT to address noted impairments and functional limitations (see below for any additional details). Focus next session on assessing functional mobility further with transfers and ambulating as appropriate. Upon hospital discharge, anticipate pt would benefit with outpatient PT.      Follow Up Recommendations Outpatient PT    Equipment Recommendations  Rolling walker with 5" wheels    Recommendations for Other Services OT consult     Precautions / Restrictions Precautions Precautions: Fall Restrictions Weight Bearing Restrictions: Yes LLE Weight Bearing: Weight bearing as tolerated      Mobility  Bed Mobility Overal bed mobility: Needs Assistance Bed Mobility: Supine to Sit;Sit to Supine     Supine to sit: Supervision Sit to supine: Supervision   General bed mobility comments: Pt able to slowly swing L LE from bed to/from ground, using R LE to support the L LE to offweight it and B UE to control descent. Increased time d/t pain.  Transfers                     Ambulation/Gait                Stairs            Wheelchair Mobility    Modified Rankin (Stroke Patients Only)       Balance Overall balance assessment: Needs assistance Sitting-balance support: No upper extremity supported;Feet supported Sitting balance-Leahy Scale: Good Sitting balance - Comments: hip pain limiting ability to reach outside BOS, able to sit w/out LOB with feet supported and hands on lap                                     Pertinent Vitals/Pain Pain Assessment: 0-10 Pain Score: 8 (9/10 beginning of session, 8/10 end of session) Pain Location: L hip, L knee, L proximal clavicular area(Mentioned after session body was feeling pain in general as well as fatigue) Pain Descriptors / Indicators: Aching;Grimacing;Tender Pain Intervention(s): Limited activity within patient's tolerance;Monitored during session;Premedicated before session    Home Living Family/patient expects to be discharged to:: Private residence Living Arrangements: Alone Available Help at Discharge: Friend(s) Type of Home: House Home Access: Stairs to enter   Secretary/administrator of Steps: 1 Home Layout: One level Home Equipment: Bedside commode(Friend providing bedside commode)      Prior Function Level of Independence: Independent         Comments: Pt is a self-employed Nutritional therapist.     Hand Dominance  Extremity/Trunk Assessment   Upper Extremity Assessment Upper Extremity Assessment: LUE deficits/detail(deferred B elbow extension resistance because it causes pain in L hip, able to move through full B elbow extension ROM. Cannot move L UE past 90 degrees abduction AROM d/t pain in L proximal clavicle.  )    Lower Extremity Assessment Lower Extremity Assessment: LLE deficits/detail(R LE strength and ROM WFL, able to use R LE to assist L LE for bed mobility) LLE: Unable to fully assess due to pain(Pt did not want to move L LE  through ROM d/t pain in L hip)    Cervical / Trunk Assessment Cervical / Trunk Assessment: Normal  Communication   Communication: No difficulties  Cognition Arousal/Alertness: Awake/alert Behavior During Therapy: WFL for tasks assessed/performed Overall Cognitive Status: Within Functional Limits for tasks assessed                                        General Comments General comments (skin integrity, edema, etc.): Pt L proximal clavicular area swollen, tender to touch    Exercises Total Joint Exercises Quad Sets: AROM;Strengthening;Left;10 reps;Supine Short Arc Quad: AROM;AAROM;Strengthening;Left;10 reps;Supine(Pt performed first 3 w/ AAROM, last 7 w/ AROM) Heel Slides: Other (comment)(AAROM; Strengthening; Left; 4 reps; Supine; Pt performed this exercise to his tolerance) Hip ABduction/ADduction: Other (comment)(Hip ADduction Isometrics; AROM; Strenghtening; Both; 10 reps; Supine)   Assessment/Plan    PT Assessment Patient needs continued PT services  PT Problem List Decreased strength;Decreased range of motion;Decreased activity tolerance;Decreased balance;Decreased mobility;Pain       PT Treatment Interventions Gait training;Functional mobility training;Therapeutic activities;Therapeutic exercise;Patient/family education;Stair training;Balance training    PT Goals (Current goals can be found in the Care Plan section)  Acute Rehab PT Goals Patient Stated Goal: to go home PT Goal Formulation: With patient Time For Goal Achievement: 09/08/17 Potential to Achieve Goals: Good    Frequency BID   Barriers to discharge        Co-evaluation               AM-PAC PT "6 Clicks" Daily Activity  Outcome Measure Difficulty turning over in bed (including adjusting bedclothes, sheets and blankets)?: A Little Difficulty moving from lying on back to sitting on the side of the bed? : Unable Difficulty sitting down on and standing up from a chair with arms  (e.g., wheelchair, bedside commode, etc,.)?: Unable Help needed moving to and from a bed to chair (including a wheelchair)?: A Lot Help needed walking in hospital room?: A Lot Help needed climbing 3-5 steps with a railing? : A Lot 6 Click Score: 11    End of Session Equipment Utilized During Treatment: Gait belt Activity Tolerance: Patient limited by pain Patient left: in bed;with bed alarm set;with call bell/phone within reach;Other (comment)(Pt B heels off bed, left with SCD's off, mentioned that they itched too much. Nursing notified.) Nurse Communication: Mobility status;Precautions;Weight bearing status;Other (comment)(Nursing notified of pt's pain in L clavicle. Nursing said they would contact the MD to discuss.) PT Visit Diagnosis: Muscle weakness (generalized) (M62.81);Other abnormalities of gait and mobility (R26.89);Pain Pain - Right/Left: Left Pain - part of body: Hip    Time: [id number] PT Time Calculation (min) (ACUTE ONLY): 49 min   Charges:         PT G CodesRaynald Blend:        Ocie Tino, SPT 08/25/17, 1:32 PM

## 2017-08-25 NOTE — Care Management (Signed)
[facility] can not see patient without a PCP. Dr. Rosita KeaMenz updated. [facility] information given. [facility]

## 2017-08-25 NOTE — Discharge Instructions (Signed)
INSTRUCTIONS AFTER Surgery  o Remove items at home which could result in a fall. This includes throw rugs or furniture in walking pathways o ICE to the affected joint every three hours while awake for 30 minutes at a time, for at least the first 3-5 days, and then as needed for pain and swelling.  Continue to use ice for pain and swelling. You may notice swelling that will progress down to the foot and ankle.  This is normal after surgery.  Elevate your leg when you are not up walking on it.   o Continue to use the breathing machine you got in the hospital (incentive spirometer) which will help keep your temperature down.  It is common for your temperature to cycle up and down following surgery, especially at night when you are not up moving around and exerting yourself.  The breathing machine keeps your lungs expanded and your temperature down.   DIET:  As you were doing prior to hospitalization, we recommend a well-balanced diet.  DRESSING / WOUND CARE / SHOWERING  Dressing is waterproof.  Change dressing as needed.  Able to shower with waterproof bandage.  [facility]  ACTIVITY  o Increase activity slowly as tolerated, but follow the weight bearing instructions below.   o No driving for 6 weeks or until further direction given by your physician.  You cannot drive while taking narcotics.  o No lifting or carrying greater than 10 lbs. until further directed by your surgeon. o Avoid periods of inactivity such as sitting longer than an hour when not asleep. This helps prevent blood clots.  o You may return to work once you are authorized by your doctor.     WEIGHT BEARING  Weight bearing as tolerated with  walker   EXERCISES Gait training and Strengthening.   CONSTIPATION  Constipation is defined medically as fewer than three stools per week and severe constipation as less than one stool per week.  Even if you have a regular bowel  pattern at home, your normal regimen is likely to be disrupted due to multiple reasons following surgery.  Combination of anesthesia, postoperative narcotics, change in appetite and fluid intake all can affect your bowels.   YOU MUST use at least one of the following options; they are listed in order of increasing strength to get the job done.  They are all available over the counter, and you may need to use some, POSSIBLY even all of these options:    Drink plenty of fluids (prune juice may be helpful) and high fiber foods Colace 100 mg by mouth twice a day  Senokot for constipation as directed and as needed Dulcolax (bisacodyl), take with full glass of water  Miralax (polyethylene glycol) once or twice a day as needed.  If you have tried all these things and are unable to have a bowel movement in the first 3-4 days after surgery call either your surgeon or your primary doctor.    If you experience loose stools or diarrhea, hold the medications until you stool forms back up.  If your symptoms do not get better within 1 week or if they get worse, check with your doctor.  If you experience "the worst abdominal pain ever" or develop nausea or vomiting, please contact the office immediately for further recommendations for treatment.   ITCHING:  If you experience itching with your medications, try taking only a single pain pill, or even half  a pain pill at a time.  You can also use Benadryl over the counter for itching or also to help with sleep.   TED HOSE STOCKINGS:  Use stockings on both legs until for at least 2 weeks or as directed by physician office. They may be removed at night for sleeping.  MEDICATIONS:  See your medication summary on the After Visit Summary that nursing will review with you.  You may have some home medications which will be placed on hold until you complete the course of blood thinner medication.  It is important for you to complete the blood thinner medication as  prescribed.  PRECAUTIONS:  If you experience chest pain or shortness of breath - call 911 immediately for transfer to the hospital emergency department.   If you develop a fever greater that 101 F, purulent drainage from wound, increased redness or drainage from wound, foul odor from the wound/dressing, or calf pain - CONTACT YOUR SURGEON.                                                   FOLLOW-UP APPOINTMENTS:  If you do not already have a post-op appointment, please call the office for an appointment to be seen by your surgeon.  Guidelines for how soon to be seen are listed in your After Visit Summary, but are typically between 1-4 weeks after surgery.  OTHER INSTRUCTIONS:     MAKE SURE YOU:   Understand these instructions.   Get help right away if you are not doing well or get worse.    Thank you for letting us be a part of your medical care team.  It is a privilege we respect greatly.  We hope these instructions will help you stay on track for a fast and full recovery!

## 2017-08-25 NOTE — Care Management Note (Signed)
Case Management Note  Patient Details  Name: JONCARLOS ATKISON MRN: [id number] Date of Birth: 10-Sep-1975  Subjective/Objective:  Met with patient at bedside to discuss discharge planning. Patient lives alone.  He will need a walker. Ordered from [place] with [facility]. Patient is uninsured. He has no PCP.  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 Application given for [facility]. Requested Corene Cornea with [facility] evaluate patient for charity program for HHPT. Spoke with Dr. Rudene Christians, he will discharge patient on [facility].                   Action/Plan:   Expected Discharge Date:                  Expected Discharge Plan:  [place]  In-House Referral:     Discharge planning Services  CM Consult, [facility], Medication Assistance  Post Acute Care Choice:  Durable Medical Equipment, Home Health Choice offered to:  Patient  DME Arranged:  Walker rolling DME Agency:  [place]:  PT [place]:  [place]  Status of Service:  In process, will continue to follow  If discussed at Long Length of Stay Meetings, dates discussed:    Additional Comments:  Jolly Mango, RN 08/25/2017, 3:47 PM

## 2017-08-25 NOTE — Progress Notes (Signed)
Physical Therapy Treatment Patient Details Name: Benjamin Obrien MRN: [id number] DOB: [birth year] Today's Date: 08/25/2017    History of Present Illness Pt is a 42 y.o. male s/p fall down stairs carrying 300# water heater (water heater fell on pt) and pt sustained acute comminuted minimally displaced L intertrochanteric femur fx.  Pt s/p L IMN intertrochantric-short affixus 08/24/17.  PMH includes pancreatitis.    PT Comments    Pt had 9/10 pain in L hip in the beginning of session but was able to complete session fully. Pt was able to both transfer and ambulate w/ min guard for safety. Pt walked 120 ft w/ RW, and during walk he was hesitant to weight bear through L LE d/t pain in L hip. He was given vc's on how to perform normal gait technique w/ RW and bear weight as tolerated through L LE and and an explanation on the benefits of practicing the correct pattern for pt's future recovery. After, he began to ambulate w/ a partial step through pattern and bear weight to his tolerance through L LE. Changing pt d/c recommendation to home health PT, case manager notified. Progress pt strength and functional mobility to pt tolerance. Focus next session on stair practice (pt has 1 step entry, no rails) and gait mechanics w/ RW.    Follow Up Recommendations  Home health PT     Equipment Recommendations  Rolling walker with 5" wheels    Recommendations for Other Services OT consult     Precautions / Restrictions Precautions Precautions: Fall Restrictions Weight Bearing Restrictions: Yes LLE Weight Bearing: Weight bearing as tolerated    Mobility  Bed Mobility Overal bed mobility: Needs Assistance Bed Mobility: Supine to Sit     Supine to sit: Supervision     General bed mobility comments: Pt able to slowly swing L LE from bed to ground, using R LE to support the L LE to offweight it and B UE to control descent. Increased time d/t pain.  Transfers Overall transfer level: Needs  assistance Equipment used: Rolling walker (2 wheeled) Transfers: Sit to/from Stand Sit to Stand: Min guard         General transfer comment: Pt was given vc's for placement of UE during sit to/from stand.   Ambulation/Gait Ambulation/Gait assistance: Min guard Ambulation Distance (Feet): 120 Feet Assistive device: Rolling walker (2 wheeled) Gait Pattern/deviations: Step-through pattern;Decreased stance time - left;Decreased step length - right;Antalgic Gait velocity: decreased   General Gait Details: Initially walked w/out bearing weight through L LE d/t pain in L hip. Pt was given vc's to encourage more normal gait pattern and began to walk w/ a partial step-through pattern and bear weight as tolerated w/ L LE.   Stairs             Wheelchair Mobility    Modified Rankin (Stroke Patients Only)       Balance Overall balance assessment: Needs assistance Sitting-balance support: No upper extremity supported;Feet supported Sitting balance-Leahy Scale: Good Sitting balance - Comments: hip pain limiting ability to reach outside BOS, able to sit w/out LOB with feet supported and hands on lap   Standing balance support: No upper extremity supported;During functional activity Standing balance-Leahy Scale: Good Standing balance comment: Pt able to urinate and wash hands while standing w/ RW, no LOB noted                            Cognition Arousal/Alertness: Awake/alert Behavior  During Therapy: WFL for tasks assessed/performed Overall Cognitive Status: Within Functional Limits for tasks assessed                                        Exercises      General Comments General comments (skin integrity, edema, etc.): L proximal clavicular area swollen, tender to touch      Pertinent Vitals/Pain Pain Assessment: 0-10 Pain Score: 9 (9/10 at beginnnig of session) Pain Location: L hip, L knee, L proximal clavicular area Pain Descriptors /  Indicators: Aching;Grimacing;Tender Pain Intervention(s): Limited activity within patient's tolerance;Monitored during session    Home Living Family/patient expects to be discharged to:: Private residence Living Arrangements: Alone Available Help at Discharge: Friend(s);Available PRN/intermittently Type of Home: House Home Access: Stairs to enter Entrance Stairs-Rails: None Home Layout: One level Home Equipment: Bedside commode(Friend providing bedside commode)      Prior Function Level of Independence: Independent      Comments: Pt independent at baseline, driving, is a self-employed Nutritional therapistplumber, 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   PT Goals (current goals can now be found in the care plan section) Acute Rehab PT Goals Patient Stated Goal: to go home PT Goal Formulation: With patient Time For Goal Achievement: 09/08/17 Potential to Achieve Goals: Good Progress towards PT goals: Progressing toward goals    Frequency    BID      PT Plan Current plan remains appropriate    Co-evaluation              AM-PAC PT "6 Clicks" Daily Activity  Outcome Measure  Difficulty turning over in bed (including adjusting bedclothes, sheets and blankets)?: A Little Difficulty moving from lying on back to sitting on the side of the bed? : Unable Difficulty sitting down on and standing up from a chair with arms (e.g., wheelchair, bedside commode, etc,.)?: Unable Help needed moving to and from a bed to chair (including a wheelchair)?: A Little Help needed walking in hospital room?: A Little Help needed climbing 3-5 steps with a railing? : A Lot 6 Click Score: 13    End of Session Equipment Utilized During Treatment: Gait belt Activity Tolerance: Patient tolerated treatment well Patient left: in chair;with nursing/sitter in room;with call bell/phone within reach;with chair alarm set;with family/visitor present(Nursing tech came to take pt to bathroom at end of session, pt family decided to wait downstairs until  pt was done with bathroom) Nurse Communication: Mobility status;Precautions;Weight bearing status PT Visit Diagnosis: Muscle weakness (generalized) (M62.81);Other abnormalities of gait and mobility (R26.89);Pain Pain - Right/Left: Left Pain - part of body: Hip     Time: [id number] PT Time Calculation (min) (ACUTE ONLY): 31 min  Charges:                       G CodesRaynald Blend:       Kennita Pavlovich, SPT 08/25/17, 5:21 PM

## 2017-08-25 NOTE — Progress Notes (Signed)
  Subjective: 1 Day Post-Op Procedure(s) (LRB): INTRAMEDULLARY (IM) NAIL INTERTROCHANTRIC-SHORT AFFIXUS (Left) Patient reports pain as mild.   Patient seen in rounds with Dr. Rosita KeaMenz. Patient is well, and has had no acute complaints or problems Plan is to go Home after hospital stay. Negative for chest pain and shortness of breath Fever: no Gastrointestinal: Negative for nausea and vomiting  Objective: Vital signs in last 24 hours: Temp:  [97.2 F (36.2 C)-98.8 F (37.1 C)] 98.5 F (36.9 C) (06/07 0317) Pulse Rate:  [58-87] 83 (06/07 0317) Resp:  [7-20] 18 (06/07 0317) BP: (100-130)/(51-91) 111/80 (06/07 0317) SpO2:  [94 %-100 %] 99 % (06/07 0317) Weight:  [66.2 kg (146 lb)] 66.2 kg (146 lb) (06/07 0442)  Intake/Output from previous day:  Intake/Output Summary (Last 24 hours) at 08/25/2017 0730 Last data filed at 08/25/2017 0457 Gross per 24 hour  Intake 2073.75 ml  Output 1100 ml  Net 973.75 ml    Intake/Output this shift: No intake/output data recorded.  Labs: Recent Labs    08/23/17 2025 08/24/17 0510 08/25/17 0438  HGB 13.7 13.3 12.2*   Recent Labs    08/24/17 0510 08/25/17 0438  WBC 13.0* 10.5  RBC 4.21* 3.79*  HCT 39.2* 35.4*  PLT 350 292   Recent Labs    08/24/17 0510 08/25/17 0438  NA 136 136  K 3.8 4.2  CL 103 104  CO2 26 27  BUN 13 9  CREATININE 0.98 0.80  GLUCOSE 108* 106*  CALCIUM 8.0* 7.9*   Recent Labs    08/23/17 2025  INR 0.96     EXAM General - Patient is Alert and Oriented Extremity - Neurovascular intact Dorsiflexion/Plantar flexion intact No cellulitis present Compartment soft Dressing/Incision - clean, dry, sero-sanguinous drainage Motor Function - intact, moving foot and toes well on exam.   Past Medical History:  Diagnosis Date  . Pancreatitis     Assessment/Plan: 1 Day Post-Op Procedure(s) (LRB): INTRAMEDULLARY (IM) NAIL INTERTROCHANTRIC-SHORT AFFIXUS (Left) Active Problems:   Hip fracture (HCC)  Estimated  body mass index is 22.87 kg/m as calculated from the following:   Height as of this encounter: 5\' 7"  (1.702 m).   Weight as of this encounter: 66.2 kg (146 lb). Plan for discharge tomorrow  DVT Prophylaxis - Lovenox, Foot Pumps and TED hose Weight-Bearing as tolerated to left leg  Dedra Skeensodd Luciann Gossett, PA-C Orthopaedic Surgery 08/25/2017, 7:30 AM

## 2017-08-25 NOTE — Evaluation (Signed)
Occupational Therapy Evaluation Patient Details Name: Benjamin Obrien MRN: [id number] DOB: 04-Aug-1975 Today's Date: 08/25/2017    History of Present Illness Pt is a 42 y.o. male s/p fall down stairs carrying 300# water heater (water heater fell on pt) and pt sustained acute comminuted minimally displaced L intertrochanteric femur fx.  Pt s/p L IMN intertrochantric-short affixus 08/24/17.  PMH includes pancreatitis.   Clinical Impression   Pt seen for OT evaluation this date. Prior to hospital admission, pt was independent, working as a Nutritional therapist.  Pt lives by himself in a 1 story home with 1 STE.  Currently pt demonstrates impairments in pain (10/10 at rest, pt recently had pain meds) and impaired LUE functional use 2:2 clavicle pain with shoulder ROM >90 flexion/abduction requiring min assist for UB dressing and LB dressing and bathing, also requiring CGA for toileting. Pt instructed in use of BSC over toilet at home, hemi techniques for UB dressing, and AE for LB ADL tasks to improve safety, pain control, and independence. Pt would benefit from skilled OT to address noted impairments and functional limitations (see below for any additional details) while in the hospital in order to maximize safety and independence while minimizing falls risk and caregiver burden.  Upon hospital discharge, recommend pt discharge to home with family/peer supports as needed.    Follow Up Recommendations  No OT follow up    Equipment Recommendations  3 in 1 bedside commode(planning to borow 3:1 from friend)    Recommendations for Other Services       Precautions / Restrictions Precautions Precautions: Fall Restrictions Weight Bearing Restrictions: Yes LLE Weight Bearing: Weight bearing as tolerated      Mobility Bed Mobility Overal bed mobility: Needs Assistance Bed Mobility: Supine to Sit;Sit to Supine     Supine to sit: Supervision Sit to supine: Supervision   General bed mobility comments: very pain  limited, slow, but able to perform without physical assist  Transfers Overall transfer level: Needs assistance Equipment used: Rolling walker (2 wheeled) Transfers: Sit to/from Stand Sit to Stand: Min guard         General transfer comment: pt too pain limited this date    Balance Overall balance assessment: Needs assistance Sitting-balance support: No upper extremity supported;Feet supported Sitting balance-Leahy Scale: Good                                     ADL either performed or assessed with clinical judgement   ADL Overall ADL's : Needs assistance/impaired Eating/Feeding: Sitting;Independent   Grooming: Sitting;Independent   Upper Body Bathing: Sitting;Modified independent   Lower Body Bathing: Sit to/from stand;Supervison/ safety   Upper Body Dressing : Sitting;Minimal assistance Upper Body Dressing Details (indicate cue type and reason): pt educated in hemi techniques to improve independence and minimize L clavicle pain with shoudler flexion/abduction; pt would benefit from additional instruction, too pain limited to tolerate trial this date Lower Body Dressing: Sit to/from stand;Minimal assistance Lower Body Dressing Details (indicate cue type and reason): pt educated in AE for LB dressing to improve independence and minimize L hip pain; pt would benefit from additional instruction, too pain limited to tolerate trial this date Toilet Transfer: Min Social research officer, government Details (indicate cue type and reason): instructed in use of BSC frame over toilet to increase height to improve safety and comfort with toilet transfers in the home  Vision Baseline Vision/History: No visual deficits Patient Visual Report: No change from baseline Vision Assessment?: No apparent visual deficits     Perception     Praxis      Pertinent Vitals/Pain Pain Assessment: 0-10 Pain Score: 10-Worst pain ever Pain Location: L hip,  L knee, L proximal clavicular area Pain Descriptors / Indicators: Aching;Grimacing Pain Intervention(s): Limited activity within patient's tolerance;Monitored during session;Premedicated before session;Repositioned     Hand Dominance Right   Extremity/Trunk Assessment Upper Extremity Assessment Upper Extremity Assessment: LUE deficits/detail(RUE WFL) LUE Deficits / Details: elbow and grip strength WFL, shoulder AROM to 90 only (>90 has severe clavicle pain) LUE: Unable to fully assess due to pain LUE Sensation: WNL LUE Coordination: WNL   Lower Extremity Assessment Lower Extremity Assessment: Defer to PT evaluation;LLE deficits/detail   Cervical / Trunk Assessment Cervical / Trunk Assessment: Normal   Communication Communication Communication: No difficulties   Cognition Arousal/Alertness: Awake/alert Behavior During Therapy: WFL for tasks assessed/performed Overall Cognitive Status: Within Functional Limits for tasks assessed                                     General Comments  L proximal clavicular area swollen, tender to touch, L hip bandages noted with old blood, notified RN. RN aware    Exercises     Shoulder Instructions      Home Living Family/patient expects to be discharged to:: Private residence Living Arrangements: Alone Available Help at Discharge: Friend(s);Available PRN/intermittently Type of Home: House Home Access: Stairs to enter Enter[employer] CorporationEntrance Stairs-Number of Steps: 1   Home Layout: One level     Bathroom Shower/Tub: IT trainerTub/shower unit;Curtain   Bathroom Toilet: Standard     Home Equipment: Bedside commode(Friend providing bedside commode)          Prior Functioning/Environment Level of Independence: Independent        Comments: Pt independent at baseline, driving, is a self-employed Nutritional therapistplumber, no falls.        OT Problem List: Decreased knowledge of use of DME or AE;Decreased range of motion;Pain;Impaired UE functional use       OT Treatment/Interventions: Self-care/ADL training;Therapeutic exercise;Therapeutic activities;DME and/or AE instruction;Patient/family education    OT Goals(Current goals can be found in the care plan section) Acute Rehab OT Goals Patient Stated Goal: go home and return to PLOF OT Goal Formulation: With patient/family Time For Goal Achievement: 09/08/17 Potential to Achieve Goals: Good ADL Goals Pt Will Perform Upper Body Dressing: with modified independence;sitting(using hemi techniques to minimize LUE pain) Pt Will Perform Lower Body Dressing: with modified independence;sit to/from stand;with adaptive equipment Pt Will Transfer to Toilet: with supervision;ambulating(LRAD for amb, BSC frame over toilet)  OT Frequency: Min 1X/week   Barriers to D/C:            Co-evaluation              AM-PAC PT "6 Clicks" Daily Activity     Outcome Measure Help from another person eating meals?: None Help from another person taking care of personal grooming?: None Help from another person toileting, which includes using toliet, bedpan, or urinal?: A Little Help from another person bathing (including washing, rinsing, drying)?: A Little Help from another person to put on and taking off regular upper body clothing?: None Help from another person to put on and taking off regular lower body clothing?: A Little 6 Click Score: 21   End of Session  Activity Tolerance: Patient limited by pain;Patient tolerated treatment well Patient left: in bed;with call bell/phone within reach;with bed alarm set;with family/visitor present  OT Visit Diagnosis: Other abnormalities of gait and mobility (R26.89);Pain Pain - Right/Left: Left Pain - part of body: Shoulder;Hip                Time: 1610-9604 OT Time Calculation (min): 12 min Charges:  OT General Charges $OT Visit: 1 Visit OT Evaluation $OT Eval Low Complexity: 1 Low  Richrd Prime, MPH, MS, OTR/L ascom [phone] 08/25/17, 4:24  PM

## 2017-08-25 NOTE — Progress Notes (Addendum)
Patient stated he could walk anywhere he wanted per Dr. Rosita KeaMenz. This Clinical research associatewriter told him he could walk around nursing unit and could not go outside to smoke. Patient returned to his room and stated to his mother let's go I'm leaving. Called Rosita KeaMenz and [place], both stated if he leaves he must sign AMA. Patient left room and refused to sign AMA paper that he was "too intoxicated" and continued to walk away. AD witnessed.

## 2017-08-25 NOTE — Progress Notes (Signed)
[employer] - [place] [facility]                                                                                                                                                                                  Patient Demographics   Benjamin Obrien, is a 42 y.o. male, DOB - 04/07/1975, ZOX:[id number]  Admit date - 08/23/2017   Admitting Physician Cammy Copa, MD  Outpatient Primary MD for the patient is Patient, No Pcp Per   LOS - 2  Subjective: Patient continues to pain complain of pain in his legs states that his pain is not under control Complains of pain in his collar bone   Review of Systems:   CONSTITUTIONAL: No documented fever. No fatigue, weakness. No weight gain, no weight loss.  EYES: No blurry or double vision.  ENT: No tinnitus. No postnasal drip. No redness of the oropharynx.  RESPIRATORY: No cough, no wheeze, no hemoptysis. No dyspnea.  CARDIOVASCULAR: No chest pain. No orthopnea. No palpitations. No syncope.  GASTROINTESTINAL: No nausea, no vomiting or diarrhea. No abdominal pain. No melena or hematochezia.  GENITOURINARY: No dysuria or hematuria.  ENDOCRINE: No polyuria or nocturia. No heat or cold intolerance.  HEMATOLOGY: No anemia. No bruising. No bleeding.  INTEGUMENTARY: No rashes. No lesions.  Positive leg pain MUSCULOSKELETAL: No arthritis. No swelling. No gout. + severe  NEUROLOGIC: No numbness, tingling, or ataxia. No seizure-type activity.  PSYCHIATRIC: No anxiety. No insomnia. No ADD.    Vitals:   Vitals:   08/24/17 2329 08/25/17 0317 08/25/17 0442 08/25/17 0807  BP: 124/77 111/80  121/82  Pulse: 87 83  79  Resp: 18 18    Temp: 97.9 F (36.6 C) 98.5 F (36.9 C)  98 F (36.7 C)  TempSrc: Oral Oral  Oral  SpO2: 100% 99%  100%  Weight:   66.2 kg (146 lb)   Height:        Wt Readings from Last 3 Encounters:  08/25/17 66.2 kg (146 lb)  01/28/15 66.7 kg (147 lb)  12/22/14 72.6 kg (160 lb)     Intake/Output Summary (Last 24  hours) at 08/25/2017 1215 Last data filed at 08/25/2017 8119 Gross per 24 hour  Intake 2073.75 ml  Output 1200 ml  Net 873.75 ml    Physical Exam:   GENERAL: Pleasant-appearing in no apparent distress.  HEAD, EYES, EARS, NOSE AND THROAT: Atraumatic, normocephalic. Extraocular muscles are intact. Pupils equal and reactive to light. Sclerae anicteric. No conjunctival injection. No oro-pharyngeal erythema.  NECK: Supple. There is no jugular venous distention. No bruits, no lymphadenopathy, no thyromegaly.  HEART: Regular rate and rhythm,. No murmurs, no rubs, no clicks.  LUNGS: Clear to auscultation bilaterally. No rales or rhonchi. No wheezes.  ABDOMEN: Soft, flat, nontender, nondistended. Has good bowel sounds. No hepatosplenomegaly appreciated.  EXTREMITIES: No evidence of any cyanosis, clubbing, or peripheral edema.  +2 pedal and radial pulses bilaterally.  NEUROLOGIC: The patient is alert, awake, and oriented x3 with no focal motor or sensory deficits appreciated bilaterally.  SKIN: Moist and warm with no rashes appreciated.  Psych: Not anxious, depressed LN: No inguinal LN enlargement    Antibiotics   Anti-infectives (From admission, onward)   Start     Dose/Rate Route Frequency Ordered Stop   08/25/17 0000  ceFAZolin (ANCEF) IVPB 2g/100 mL premix     2 g 200 mL/hr over 30 Minutes Intravenous Every 6 hours 08/24/17 1837 08/25/17 1203   08/24/17 1400  ceFAZolin (ANCEF) powder 1 g  Status:  Discontinued     1 g Other To Surgery 08/23/17 2253 08/23/17 2350   08/24/17 1400  ceFAZolin (ANCEF) IVPB 1 g/50 mL premix     1 g 100 mL/hr over 30 Minutes Intravenous On call to O.R. 08/23/17 2350 08/24/17 1716      Medications   Scheduled Meds: . docusate sodium  100 mg Oral BID  . enoxaparin (LOVENOX) injection  40 mg Subcutaneous Q24H  . nicotine  21 mg Transdermal Daily  . traMADol  50 mg Oral Q6H   Continuous Infusions: . sodium chloride 75 mL/hr at 08/24/17 1843  .  methocarbamol (ROBAXIN)  IV     PRN Meds:.acetaminophen, alum & mag hydroxide-simeth, bisacodyl, bisacodyl, diphenhydrAMINE, HYDROcodone-acetaminophen, HYDROmorphone (DILAUDID) injection, magnesium citrate, magnesium hydroxide, menthol-cetylpyridinium **OR** phenol, methocarbamol **OR** methocarbamol (ROBAXIN)  IV, metoCLOPramide **OR** metoCLOPramide (REGLAN) injection, ondansetron **OR** ondansetron (ZOFRAN) IV, zolpidem   Data Review:   Micro Results Recent Results (from the past 240 hour(s))  Surgical pcr screen     Status: Abnormal   Collection Time: 08/24/17  3:14 AM  Result Value Ref Range Status   MRSA, PCR (A) NEGATIVE Final    INVALID, UNABLE TO DETERMINE THE PRESENCE OF TARGET DNA DUE TO SPECIMEN INTEGRITY. RECOLLECTION REQUESTED.   Staphylococcus aureus (A) NEGATIVE Final    INVALID, UNABLE TO DETERMINE THE PRESENCE OF TARGET DNA DUE TO SPECIMEN INTEGRITY. RECOLLECTION REQUESTED.    CommentFeliberto Harts AT 1610 ON 08/24/2017 JJB Performed at [facility], [address] [place], [place] [address]   Surgical pcr screen     Status: None   Collection Time: 08/24/17  6:55 AM  Result Value Ref Range Status   MRSA, PCR NEGATIVE NEGATIVE Final   Staphylococcus aureus NEGATIVE NEGATIVE Final    Comment: (NOTE) The Xpert SA Assay (FDA approved for NASAL specimens in patients 81 years of age and older), is one component of a comprehensive surveillance program. It is not intended to diagnose infection nor to guide or monitor treatment. Performed at [facility], [address], [place], [place] [address]     Radiology Reports Ct Head Wo Contrast  Result Date: 08/23/2017 CLINICAL DATA:  Fall down stairs while carrying water heater, water heater fell on patient. EXAM: CT HEAD WITHOUT CONTRAST CT CERVICAL SPINE WITHOUT CONTRAST TECHNIQUE: Multidetector CT imaging of the head and cervical spine was performed following the standard protocol without  intravenous contrast. Multiplanar CT image reconstructions of the cervical spine were also generated. COMPARISON:  None. FINDINGS: CT HEAD FINDINGS Brain: No intracranial hemorrhage, mass effect, or midline shift. No hydrocephalus. The basilar cisterns are patent. No evidence of territorial infarct  or acute ischemia. No extra-axial or intracranial fluid collection. Vascular: No hyperdense vessel or unexpected calcification. Skull: No fracture or focal lesion. Sinuses/Orbits: Paranasal sinuses and mastoid air cells are clear. The visualized orbits are unremarkable. Other: None. CT CERVICAL SPINE FINDINGS Alignment: Straightening of normal lordosis. No traumatic subluxation. Skull base and vertebrae: No acute fracture. Vertebral body heights are maintained. The dens and skull base are intact. Soft tissues and spinal canal: No prevertebral fluid or swelling. No visible canal hematoma. Disc levels: Disc space narrowing and endplate spurring most prominent at C4-C5 and C5-C6. Upper chest: No apical pneumothorax or acute findings. Other: None. IMPRESSION: 1.  No acute intracranial abnormality.  No skull fracture. 2. Mild degenerative change in the cervical spine without acute fracture. Electronically Signed   By: Rubye Oaks M.D.   On: 08/23/2017 21:52   Ct Chest W Contrast  Result Date: 08/23/2017 CLINICAL DATA:  Status post fall down stairs, with water heater falling on patient. Left hip and chest pain. Left clavicular pain. EXAM: CT CHEST, ABDOMEN, AND PELVIS WITH CONTRAST TECHNIQUE: Multidetector CT imaging of the chest, abdomen and pelvis was performed following the standard protocol during bolus administration of intravenous contrast. CONTRAST:  ISOVUE-370 IOPAMIDOL (ISOVUE-370) INJECTION 76% COMPARISON:  CT of the abdomen and pelvis performed 01/28/2015 FINDINGS: CT CHEST FINDINGS Cardiovascular: The heart is normal in size. There is no evidence of aortic injury. The thoracic aorta is unremarkable.  The great vessels are within normal limits. There is no evidence of venous hemorrhage. Mediastinum/Nodes: The mediastinum is unremarkable. No mediastinal lymphadenopathy is seen. No pericardial effusion is identified. The thyroid gland is unremarkable. No axillary lymphadenopathy is appreciated. Lungs/Pleura: A bleb is noted at the medial aspect of the left lung. A few tiny left-sided pulmonary nodules are seen measuring up to 4 mm in size. The lungs are otherwise clear. There is no evidence of focal consolidation, pleural effusion or pneumothorax. There is no evidence of pulmonary parenchymal contusion. Musculoskeletal: No acute osseous abnormalities are identified. The visualized musculature is unremarkable in appearance. CT ABDOMEN PELVIS FINDINGS Hepatobiliary: The liver is unremarkable in appearance. The gallbladder is unremarkable in appearance. The common bile duct remains normal in caliber. Pancreas: The pancreas is within normal limits. Spleen: The spleen is unremarkable in appearance. Adrenals/Urinary Tract: The adrenal glands are unremarkable in appearance. The kidneys are within normal limits. There is no evidence of hydronephrosis. No renal or ureteral stones are identified. No perinephric stranding is seen. Stomach/Bowel: Vague soft tissue inflammation and fluid is noted about the first segment of the duodenum, adjacent to the gallbladder. Would correlate clinically for evidence of duodenitis, and consider endoscopy to assess for underlying ulceration. This is slightly more proximal than the inflammation seen in 2016. The stomach is grossly unremarkable in appearance. The small bowel is unremarkable. The stomach is unremarkable in appearance. The small bowel is within normal limits. The appendix is normal in caliber, without evidence of appendicitis. The colon is unremarkable in appearance. Vascular/Lymphatic: The abdominal aorta is unremarkable in appearance. Mild calcification is noted along the  common iliac arteries bilaterally. The inferior vena cava is grossly unremarkable. No retroperitoneal lymphadenopathy is seen. No pelvic sidewall lymphadenopathy is identified. Reproductive: The bladder is mildly distended and grossly unremarkable. The prostate is normal in size. Other: No additional soft tissue abnormalities are seen. Musculoskeletal: There is a comminuted left femoral intertrochanteric fracture, with greater and lesser trochanteric fragments. Diffuse soft tissue edema is noted along the left quadriceps and left iliacus musculature,  concerning for acute traumatic injury. IMPRESSION: 1. Comminuted left femoral intertrochanteric fracture, with greater and lesser trochanteric fragments. 2. Diffuse soft tissue injury involving the left quadriceps and left iliacus musculature. 3. Vague soft tissue inflammation and fluid about the first segment of the duodenum, adjacent to the gallbladder. Would correlate clinically for evidence of duodenitis, and consider endoscopy to assess for underlying ulceration. This is slightly more proximal than the inflammation seen in 2016. 4. Few tiny left-sided pulmonary nodules, measuring up to 4 mm in size. No follow-up needed if patient is low-risk (and has no known or suspected primary neoplasm). Non-contrast chest CT can be considered in 12 months if patient is high-risk. This recommendation follows the consensus statement: Guidelines for Management of Incidental Pulmonary Nodules Detected on CT Images: From the Fleischner Society 2017; Radiology 2017; 284:228-243. Electronically Signed   By: Roanna Raider M.D.   On: 08/23/2017 21:56   Ct Cervical Spine Wo Contrast  Result Date: 08/23/2017 CLINICAL DATA:  Fall down stairs while carrying water heater, water heater fell on patient. EXAM: CT HEAD WITHOUT CONTRAST CT CERVICAL SPINE WITHOUT CONTRAST TECHNIQUE: Multidetector CT imaging of the head and cervical spine was performed following the standard protocol without  intravenous contrast. Multiplanar CT image reconstructions of the cervical spine were also generated. COMPARISON:  None. FINDINGS: CT HEAD FINDINGS Brain: No intracranial hemorrhage, mass effect, or midline shift. No hydrocephalus. The basilar cisterns are patent. No evidence of territorial infarct or acute ischemia. No extra-axial or intracranial fluid collection. Vascular: No hyperdense vessel or unexpected calcification. Skull: No fracture or focal lesion. Sinuses/Orbits: Paranasal sinuses and mastoid air cells are clear. The visualized orbits are unremarkable. Other: None. CT CERVICAL SPINE FINDINGS Alignment: Straightening of normal lordosis. No traumatic subluxation. Skull base and vertebrae: No acute fracture. Vertebral body heights are maintained. The dens and skull base are intact. Soft tissues and spinal canal: No prevertebral fluid or swelling. No visible canal hematoma. Disc levels: Disc space narrowing and endplate spurring most prominent at C4-C5 and C5-C6. Upper chest: No apical pneumothorax or acute findings. Other: None. IMPRESSION: 1.  No acute intracranial abnormality.  No skull fracture. 2. Mild degenerative change in the cervical spine without acute fracture. Electronically Signed   By: Rubye Oaks M.D.   On: 08/23/2017 21:52   Ct Abdomen Pelvis W Contrast  Result Date: 08/23/2017 CLINICAL DATA:  Status post fall down stairs, with water heater falling on patient. Left hip and chest pain. Left clavicular pain. EXAM: CT CHEST, ABDOMEN, AND PELVIS WITH CONTRAST TECHNIQUE: Multidetector CT imaging of the chest, abdomen and pelvis was performed following the standard protocol during bolus administration of intravenous contrast. CONTRAST:  ISOVUE-370 IOPAMIDOL (ISOVUE-370) INJECTION 76% COMPARISON:  CT of the abdomen and pelvis performed 01/28/2015 FINDINGS: CT CHEST FINDINGS Cardiovascular: The heart is normal in size. There is no evidence of aortic injury. The thoracic aorta is  unremarkable. The great vessels are within normal limits. There is no evidence of venous hemorrhage. Mediastinum/Nodes: The mediastinum is unremarkable. No mediastinal lymphadenopathy is seen. No pericardial effusion is identified. The thyroid gland is unremarkable. No axillary lymphadenopathy is appreciated. Lungs/Pleura: A bleb is noted at the medial aspect of the left lung. A few tiny left-sided pulmonary nodules are seen measuring up to 4 mm in size. The lungs are otherwise clear. There is no evidence of focal consolidation, pleural effusion or pneumothorax. There is no evidence of pulmonary parenchymal contusion. Musculoskeletal: No acute osseous abnormalities are identified. The visualized musculature is  unremarkable in appearance. CT ABDOMEN PELVIS FINDINGS Hepatobiliary: The liver is unremarkable in appearance. The gallbladder is unremarkable in appearance. The common bile duct remains normal in caliber. Pancreas: The pancreas is within normal limits. Spleen: The spleen is unremarkable in appearance. Adrenals/Urinary Tract: The adrenal glands are unremarkable in appearance. The kidneys are within normal limits. There is no evidence of hydronephrosis. No renal or ureteral stones are identified. No perinephric stranding is seen. Stomach/Bowel: Vague soft tissue inflammation and fluid is noted about the first segment of the duodenum, adjacent to the gallbladder. Would correlate clinically for evidence of duodenitis, and consider endoscopy to assess for underlying ulceration. This is slightly more proximal than the inflammation seen in 2016. The stomach is grossly unremarkable in appearance. The small bowel is unremarkable. The stomach is unremarkable in appearance. The small bowel is within normal limits. The appendix is normal in caliber, without evidence of appendicitis. The colon is unremarkable in appearance. Vascular/Lymphatic: The abdominal aorta is unremarkable in appearance. Mild calcification is noted  along the common iliac arteries bilaterally. The inferior vena cava is grossly unremarkable. No retroperitoneal lymphadenopathy is seen. No pelvic sidewall lymphadenopathy is identified. Reproductive: The bladder is mildly distended and grossly unremarkable. The prostate is normal in size. Other: No additional soft tissue abnormalities are seen. Musculoskeletal: There is a comminuted left femoral intertrochanteric fracture, with greater and lesser trochanteric fragments. Diffuse soft tissue edema is noted along the left quadriceps and left iliacus musculature, concerning for acute traumatic injury. IMPRESSION: 1. Comminuted left femoral intertrochanteric fracture, with greater and lesser trochanteric fragments. 2. Diffuse soft tissue injury involving the left quadriceps and left iliacus musculature. 3. Vague soft tissue inflammation and fluid about the first segment of the duodenum, adjacent to the gallbladder. Would correlate clinically for evidence of duodenitis, and consider endoscopy to assess for underlying ulceration. This is slightly more proximal than the inflammation seen in 2016. 4. Few tiny left-sided pulmonary nodules, measuring up to 4 mm in size. No follow-up needed if patient is low-risk (and has no known or suspected primary neoplasm). Non-contrast chest CT can be considered in 12 months if patient is high-risk. This recommendation follows the consensus statement: Guidelines for Management of Incidental Pulmonary Nodules Detected on CT Images: From the Fleischner Society 2017; Radiology 2017; 284:228-243. Electronically Signed   By: Roanna RaiderJeffery  Chang M.D.   On: 08/23/2017 21:56   Ct Femur Left Wo Contrast  Result Date: 08/23/2017 CLINICAL DATA:  Left hip pain after fall. EXAM: CT OF THE LOWER LEFT EXTREMITY WITHOUT CONTRAST TECHNIQUE: Multidetector CT imaging of the lower left extremity was performed according to the standard protocol. COMPARISON:  CT abdomen pelvis dated January 28, 2015. FINDINGS:  Bones/Joint/Cartilage Acute, comminuted, minimally displaced left intertrochanteric femur fracture. No additional fracture. No dislocation. The knee and hip joint spaces are preserved. No joint effusion. Bone mineralization is normal. Ligaments Suboptimally assessed by CT. Muscles and Tendons Unremarkable.  The iliopsoas tendon appears intact. Soft tissues Small amount of soft tissue hematoma surrounding the fracture. IMPRESSION: 1. Acute, comminuted, minimally displaced left intertrochanteric femur fracture. Electronically Signed   By: Obie DredgeWilliam T Derry M.D.   On: 08/23/2017 21:51   Dg Hand Complete Left  Result Date: 08/23/2017 CLINICAL DATA:  Status post fall down stairs, with acute onset of left hand pain. Initial encounter. EXAM: LEFT HAND - COMPLETE 3+ VIEW COMPARISON:  None. FINDINGS: There is no evidence of fracture or dislocation. The joint spaces are preserved. The carpal rows are intact, and demonstrate normal alignment.  The soft tissues are unremarkable in appearance. IMPRESSION: No evidence of fracture or dislocation. Electronically Signed   By: Roanna Raider M.D.   On: 08/23/2017 22:50   Dg Hip Operative Unilat W Or W/o Pelvis Left  Result Date: 08/24/2017 CLINICAL DATA:  ORIF LEFT femur fracture. EXAM: OPERATIVE LEFT HIP (WITH PELVIS IF PERFORMED) 3 VIEWS TECHNIQUE: Fluoroscopic spot image(s) were submitted for interpretation post-operatively. COMPARISON:  None. FINDINGS: Intraoperative views of the LEFT femur demonstrate internal rod/screw fixation of the femur, traversing a intertrochanteric fracture, in near-anatomic alignment and position. No definite complicating features of the visualized hardware noted. IMPRESSION: ORIF LEFT intertrochanteric femur fracture Electronically Signed   By: Harmon Pier M.D.   On: 08/24/2017 18:08     CBC Recent Labs  Lab 08/23/17 2025 08/24/17 0510 08/25/17 0438  WBC 16.7* 13.0* 10.5  HGB 13.7 13.3 12.2*  HCT 40.0 39.2* 35.4*  PLT 378 350 292  MCV  92.8 93.0 93.3  MCH 31.8 31.6 32.2  MCHC 34.3 34.0 34.5  RDW 13.1 13.0 12.9    Chemistries  Recent Labs  Lab 08/23/17 2025 08/24/17 0510 08/25/17 0438  NA 133* 136 136  K 4.1 3.8 4.2  CL 101 103 104  CO2 19* 26 27  GLUCOSE 141* 108* 106*  BUN 12 13 9   CREATININE 0.91 0.98 0.80  CALCIUM 8.2* 8.0* 7.9*  AST 39  --   --   ALT 25  --   --   ALKPHOS 41  --   --   BILITOT 0.5  --   --    ------------------------------------------------------------------------------------------------------------------ estimated creatinine clearance is 112.5 mL/min (by C-G formula based on SCr of 0.8 mg/dL). ------------------------------------------------------------------------------------------------------------------ No results for input(s): HGBA1C in the last 72 hours. ------------------------------------------------------------------------------------------------------------------ No results for input(s): CHOL, HDL, LDLCALC, TRIG, CHOLHDL, LDLDIRECT in the last 72 hours. ------------------------------------------------------------------------------------------------------------------ No results for input(s): TSH, T4TOTAL, T3FREE, THYROIDAB in the last 72 hours.  Invalid input(s): FREET3 ------------------------------------------------------------------------------------------------------------------ No results for input(s): VITAMINB12, FOLATE, FERRITIN, TIBC, IRON, RETICCTPCT in the last 72 hours.  Coagulation profile Recent Labs  Lab 08/23/17 2025  INR 0.96    No results for input(s): DDIMER in the last 72 hours.  Cardiac Enzymes No results for input(s): CKMB, TROPONINI, MYOGLOBIN in the last 168 hours.  Invalid input(s): CK ------------------------------------------------------------------------------------------------------------------ Invalid input(s): POCBNP    Assessment & Plan  Patient is 42 year old presenting with left hip pain  1.  Left hip fracture, status post  repair pain control  2.  Left lung nodules,  outpatient follow-up with primary care provider to arrange following imaging  3.  Tobacco abuse, s smoking cessation provided       Code Status Orders  (From admission, onward)        Start     Ordered   08/24/17 0212  Full code  Continuous     08/24/17 0212    Code Status History    This patient has a current code status but no historical code status.           Consults orthopedic  DVT Prophylaxis SCDs until surgery  Lab Results  Component Value Date   PLT 292 08/25/2017     Time Spent in minutes   35 minutes greater than 50% of time spent in care coordination and counseling patient regarding the condition and plan of care.   Auburn Bilberry M.D on 08/25/2017 at 12:15 PM  Between 7am to 6pm - Pager - [phone]  After 6pm go to [email or web address] -  password Airline pilot  [employer]  [phone]

## 2017-08-25 NOTE — Progress Notes (Signed)
Called [facility] regarding PT's concerns with left clavicle area. CT already performed on admission. MD stated to continue with WBAT to left arm

## 2017-08-26 NOTE — Anesthesia Postprocedure Evaluation (Signed)
Anesthesia Post Note  Patient: Quita SkyeJames D Eisenhuth  Procedure(s) Performed: INTRAMEDULLARY (IM) NAIL INTERTROCHANTRIC-SHORT AFFIXUS (Left Hip)  Anesthesia Type: Spinal Comments: Pt left AMA, yesterday. Unable to evaluate.      Last Vitals:  Vitals:   08/25/17 0807 08/25/17 1630  BP: 121/82 (!) 130/92  Pulse: 79 80  Resp:    Temp: 36.7 C 36.9 C  SpO2: 100% 100%    Last Pain:  Vitals:   08/25/17 1538  TempSrc:   PainSc: 10-Worst pain ever                 Marah Park K

## 2017-09-02 NOTE — Discharge Summary (Signed)
[facility] - [place] at [facility]  AGAINST MEDICAL ADVICE summary  Benjamin LombardJames D Obrien, 42 y.o., DOB 09-27-1975, MRN [id number]. Admission date: 08/23/2017 Discharge Date 09/02/2017 Primary MD Patient, No Pcp Per Admitting Physician Cammy CopaAngela Maier, MD  Admission Diagnosis  Pulmonary nodules [R91.8] Hip fracture, left, closed, initial encounter [facility](HCC) [S72.002A]  Discharge Diagnosis   Active Problems: Left hip fracture [facility](HCC) Tobacco abuse        Hospital Course patient is a 42 year old African-American male admitted with hip injury.  He was noted to have left-sided hip fracture.  He underwent surgery.  Orthopedics were following him.  On the day of AGAINST MEDICAL ADVICE patient was upset with the nurse and was not happy with his care.  He wanted to leave AGAINST MEDICAL ADVICE.  Orthopedic physician recommended that he stay 1 more day however patient stated that he is going to leave AGAINST MEDICAL ADVICE and he signed paperwork and left AGAINST MEDICAL ADVICE.              Benjamin BilberryShreyang [name] M.D on 09/02/2017 at 6:47 PM [facility]   Office  [phone]

## 2017-09-08 ENCOUNTER — Emergency Department
Admission: EM | Admit: 2017-09-08 | Discharge: 2017-09-08 | Disposition: A | Discharge status: Home / Self Care | Payer: Self-pay | Attending: Emergency Medicine | Admitting: Emergency Medicine

## 2017-09-08 ENCOUNTER — Encounter: Payer: Self-pay | Admitting: Emergency Medicine

## 2017-09-08 DIAGNOSIS — X58XXXA Exposure to other specified factors, initial encounter: Secondary | ICD-10-CM | POA: Insufficient documentation

## 2017-09-08 DIAGNOSIS — G8918 Other acute postprocedural pain: Secondary | ICD-10-CM | POA: Insufficient documentation

## 2017-09-08 DIAGNOSIS — M25552 Pain in left hip: Secondary | ICD-10-CM

## 2017-09-08 DIAGNOSIS — S71112D Laceration without foreign body, left thigh, subsequent encounter: Secondary | ICD-10-CM | POA: Insufficient documentation

## 2017-09-08 DIAGNOSIS — Z4802 Encounter for removal of sutures: Secondary | ICD-10-CM

## 2017-09-08 DIAGNOSIS — F17210 Nicotine dependence, cigarettes, uncomplicated: Secondary | ICD-10-CM | POA: Insufficient documentation

## 2017-09-08 MED ORDER — CEPHALEXIN 500 MG PO CAPS: 500.0000 mg | ORAL_CAPSULE | Freq: Three times a day (TID) | ORAL | 0 refills | Status: DC

## 2017-09-08 MED ORDER — HYDROCODONE-ACETAMINOPHEN 5-325 MG PO TABS
1.0000 | ORAL_TABLET | Freq: Once | ORAL | Status: AC
Start: 2017-09-08 — End: 2017-09-08
  Administered 2017-09-08: 1 via ORAL
  Filled 2017-09-08: qty 1

## 2017-09-08 MED ORDER — TRAMADOL HCL 50 MG PO TABS: 50.0000 mg | ORAL_TABLET | Freq: Four times a day (QID) | ORAL | 0 refills | Status: DC | PRN

## 2017-09-08 NOTE — Discharge Instructions (Addendum)
Follow-up with Dr. Rosita KeaMenz.  Please call him for an appointment.  He stated he went to see you in 4 weeks for repeat x-ray and evaluation.  Return to the emergency department if there is any sign of infection such as redness, pus, swelling or increased pain.

## 2017-09-08 NOTE — ED Provider Notes (Signed)
[facility] Emergency Department Provider Note  ____________________________________________   First MD Initiated Contact with Patient 09/08/17 1331     (approximate)  I have reviewed the triage vital signs and the nursing notes.   HISTORY  Chief Complaint Hip Pain    HPI Benjamin Obrien is a 42 y.o. male resents emergency department for staple removal.  He states he was seen here for a broken hip and had a surgical repair.  The repair was 2 weeks ago.  He has not had a follow-up appointment as he left prior to getting this paperwork.  He denies any fever or chills.  Denies any chest pain or shortness of breath.  Past Medical History:  Diagnosis Date  . Pancreatitis     Patient Active Problem List   Diagnosis Date Noted  . Hip fracture (HCC) 08/23/2017    Past Surgical History:  Procedure Laterality Date  . INTRAMEDULLARY (IM) NAIL INTERTROCHANTERIC Left 08/24/2017   Procedure: INTRAMEDULLARY (IM) NAIL INTERTROCHANTRIC-SHORT AFFIXUS;  Surgeon: Kennedy Bucker, MD;  Location: [facility] ORS;  Service: Orthopedics;  Laterality: Left;  . TOOTH EXTRACTION      Prior to Admission medications   Medication Sig Start Date End Date Taking? Authorizing Provider  traMADol (ULTRAM) 50 MG tablet Take 1 tablet (50 mg total) by mouth every 6 (six) hours as needed. 09/08/17   Faythe Ghee, PA-C    Allergies Patient has no known allergies.  No family history on file.  Social History Social History   Tobacco Use  . Smoking status: Current Every Day Smoker    Packs/day: 1.00    Types: Cigarettes  . Smokeless tobacco: Never Used  Substance Use Topics  . Alcohol use: Yes    Comment: Saturdays  . Drug use: Not on file    Review of Systems  Constitutional: No fever/chills Eyes: No visual changes. ENT: No sore throat. Respiratory: Denies cough Genitourinary: Negative for dysuria. Musculoskeletal: Negative for back pain. Skin: Negative for rash.  Positive  for staples in the left thigh    ____________________________________________   PHYSICAL EXAM:  VITAL SIGNS: ED Triage Vitals  Enc Vitals Group     BP 09/08/17 1305 110/71     Pulse Rate 09/08/17 1305 (!) 104     Resp 09/08/17 1305 20     Temp 09/08/17 1305 98.5 F (36.9 C)     Temp Source 09/08/17 1305 Oral     SpO2 09/08/17 1305 100 %     Weight 09/08/17 1306 145 lb (65.8 kg)     Height 09/08/17 1306 5\' 8"  (1.727 m)     Head Circumference --      Peak Flow --      Pain Score 09/08/17 1306 10     Pain Loc --      Pain Edu? --      Excl. in GC? --     Constitutional: Alert and oriented. Well appearing and in no acute distress. Eyes: Conjunctivae are normal.  Head: Atraumatic. Nose: No congestion/rhinnorhea. Mouth/Throat: Mucous membranes are moist.   Cardiovascular: Normal rate, regular rhythm. Respiratory: Normal respiratory effort.  No retractions GU: deferred Musculoskeletal: FROM all extremities, warm and well perfused Neurologic:  Normal speech and language.  Skin:  Skin is warm, dry and intact. No rash noted.  Positive staples in the left thigh.  There is no redness or swelling noted.  The wound is well approximated. Psychiatric: Mood and affect are normal. Speech and behavior are normal.  ____________________________________________   LABS (all labs ordered are listed, but only abnormal results are displayed)  Labs Reviewed - No data to display ____________________________________________   ____________________________________________  RADIOLOGY    ____________________________________________   PROCEDURES  Procedure(s) performed: Cherlynn PoloStaples removed by nursing staff  Procedures    ____________________________________________   INITIAL IMPRESSION / ASSESSMENT AND PLAN / ED COURSE  Pertinent labs & imaging results that were available during my care of the patient were reviewed by me and considered in my medical decision making (see chart for  details).  Patient is 42 year old male presents emergency department complaining of needing his staples removed from his recent hip surgery.  Surgery was 2 weeks ago.  He has not had a follow-up appointment with Dr. Rosita KeaMenz.  He states he left the hospital "AMA "due to a nurses bad attitude.  On physical exam the staples are intact.  There is no redness pus or swelling noted at the incision site.  The area is mildly tender to palpation at the proximal aspect.  Page Dr. Rosita KeaMenz to discuss patient's case as he is the one that did surgery.  He agrees to go ahead and have us remove the staples.  States to apply Steri-Strips across the incision site.  He wants to see the patient in the office in 4 weeks for repeat x-ray.  Explained all of this to the patient.  He was given a prescription for tramadol for pain.  He is to follow-up with Dr. Rosita KeaMenz in 4 weeks for a recheck.  The staples were removed by nursing staff and Steri-Strips were applied.  Patient was discharged in stable condition in the care of his wife.     As part of my medical decision making, I reviewed the following data within the electronic MEDICAL RECORD NUMBER Nursing notes reviewed and incorporated, Old chart reviewed, A consult was requested and obtained from this/these consultant(s) Orthopedics, Notes from prior ED visits and [place] Controlled Substance Database  ____________________________________________   FINAL CLINICAL IMPRESSION(S) / ED DIAGNOSES  Final diagnoses:  Visit for suture removal  Hip pain, acute, left      NEW MEDICATIONS STARTED DURING THIS VISIT:  New Prescriptions   TRAMADOL (ULTRAM) 50 MG TABLET    Take 1 tablet (50 mg total) by mouth every 6 (six) hours as needed.     Note:  This document was prepared using Dragon voice recognition software and may include unintentional dictation errors.    Faythe GheeFisher, Eular Panek W, PA-C 09/08/17 1404    Nita SickleVeronese, , MD 09/10/17 2256

## 2017-09-08 NOTE — ED Triage Notes (Signed)
Pt reports he was seen here for a broken hip and left AMA because the RN was rude. Pt here to have staples removed and to get some assistance with pain.

## 2018-04-11 ENCOUNTER — Other Ambulatory Visit: Payer: Self-pay

## 2018-04-11 ENCOUNTER — Observation Stay
Admission: EM | Admit: 2018-04-11 | Discharge: 2018-04-12 | Disposition: A | Discharge status: Home / Self Care | Payer: Self-pay | Attending: Internal Medicine | Admitting: Internal Medicine

## 2018-04-11 ENCOUNTER — Emergency Department: Payer: Self-pay

## 2018-04-11 ENCOUNTER — Encounter: Payer: Self-pay | Admitting: *Deleted

## 2018-04-11 DIAGNOSIS — D72829 Elevated white blood cell count, unspecified: Secondary | ICD-10-CM | POA: Insufficient documentation

## 2018-04-11 DIAGNOSIS — J384 Edema of larynx: Secondary | ICD-10-CM

## 2018-04-11 DIAGNOSIS — J020 Streptococcal pharyngitis: Principal | ICD-10-CM | POA: Insufficient documentation

## 2018-04-11 DIAGNOSIS — F17210 Nicotine dependence, cigarettes, uncomplicated: Secondary | ICD-10-CM | POA: Insufficient documentation

## 2018-04-11 DIAGNOSIS — Z716 Tobacco abuse counseling: Secondary | ICD-10-CM | POA: Insufficient documentation

## 2018-04-11 DIAGNOSIS — J0430 Supraglottitis, unspecified, without obstruction: Secondary | ICD-10-CM | POA: Insufficient documentation

## 2018-04-11 DIAGNOSIS — J0510 Acute epiglottitis without obstruction: Secondary | ICD-10-CM | POA: Diagnosis present

## 2018-04-11 DIAGNOSIS — B9689 Other specified bacterial agents as the cause of diseases classified elsewhere: Secondary | ICD-10-CM | POA: Insufficient documentation

## 2018-04-11 LAB — CBC WITH DIFFERENTIAL/PLATELET
Abs Immature Granulocytes: 0.13 10*3/uL — ABNORMAL HIGH (ref 0.00–0.07)
Basophils Absolute: 0.1 10*3/uL (ref 0.0–0.1)
Basophils Relative: 0 %
Eosinophils Absolute: 0.3 10*3/uL (ref 0.0–0.5)
Eosinophils Relative: 2 %
HCT: 42.4 % (ref 39.0–52.0)
Hemoglobin: 13.9 g/dL (ref 13.0–17.0)
Immature Granulocytes: 1 %
Lymphocytes Relative: 9 %
Lymphs Abs: 1.8 10*3/uL (ref 0.7–4.0)
MCH: 30.8 pg (ref 26.0–34.0)
MCHC: 32.8 g/dL (ref 30.0–36.0)
MCV: 94 fL (ref 80.0–100.0)
Monocytes Absolute: 1.7 10*3/uL — ABNORMAL HIGH (ref 0.1–1.0)
Monocytes Relative: 9 %
Neutro Abs: 15.3 10*3/uL — ABNORMAL HIGH (ref 1.7–7.7)
Neutrophils Relative %: 79 %
Platelets: 408 10*3/uL — ABNORMAL HIGH (ref 150–400)
RBC: 4.51 MIL/uL (ref 4.22–5.81)
RDW: 12.3 % (ref 11.5–15.5)
WBC: 19.3 10*3/uL — ABNORMAL HIGH (ref 4.0–10.5)
nRBC: 0 % (ref 0.0–0.2)

## 2018-04-11 LAB — COMPREHENSIVE METABOLIC PANEL
ALT: 10 U/L (ref 0–44)
AST: 13 U/L — ABNORMAL LOW (ref 15–41)
Albumin: 3.8 g/dL (ref 3.5–5.0)
Alkaline Phosphatase: 65 U/L (ref 38–126)
Anion gap: 7 (ref 5–15)
BUN: 14 mg/dL (ref 6–20)
CO2: 28 mmol/L (ref 22–32)
Calcium: 9.2 mg/dL (ref 8.9–10.3)
Chloride: 104 mmol/L (ref 98–111)
Creatinine, Ser: 0.92 mg/dL (ref 0.61–1.24)
GFR calc Af Amer: >60 mL/min (ref >60)
GFR calc non Af Amer: >60 mL/min (ref >60)
Glucose, Bld: 105 mg/dL — ABNORMAL HIGH (ref 70–99)
Potassium: 4.5 mmol/L (ref 3.5–5.1)
Sodium: 139 mmol/L (ref 135–145)
Total Bilirubin: 0.4 mg/dL (ref 0.3–1.2)
Total Protein: 7.4 g/dL (ref 6.5–8.1)

## 2018-04-11 LAB — GROUP A STREP BY PCR: Group A Strep by PCR: DETECTED — AB

## 2018-04-11 IMAGING — CT CT NECK W/ CM
3 of 4 series · 13 of 33 positions shown, 16 images · IV contrast (omnipaque)
Comparison: Head and cervical spine CTs [DATE]. Maxillofacial
CT [DATE].

CLINICAL DATA: Sore throat, swelling, and pain with swallowing.
Hoarseness. Cough and congestion. Leukocytosis.

EXAM:
CT NECK WITH CONTRAST
TECHNIQUE: Multidetector CT imaging of the neck was performed using the
standard protocol following the bolus administration of intravenous
contrast.
CONTRAST:  75mL OMNIPAQUE IOHEXOL 300 MG/ML  SOLN

[Series 6: sag neck · sagittal · 0.55mm/px · 5 of 101 slices shown, 6 images]
[im 34/101  bone]
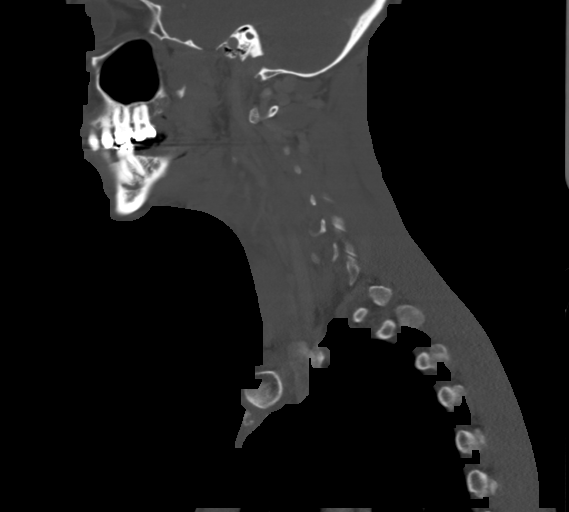
[im 42/101  bone]
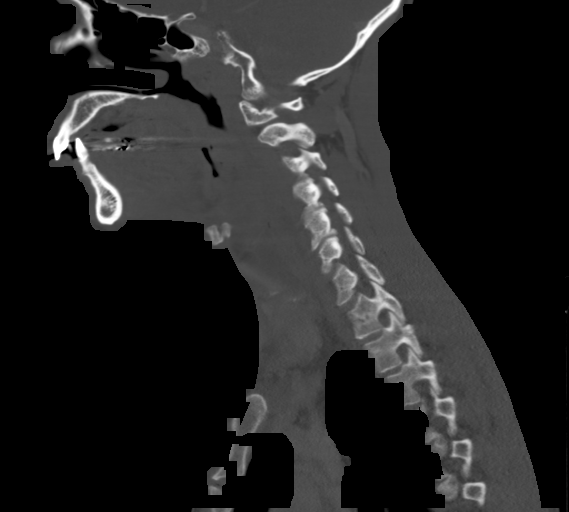
[im 51/101  soft-tissue]
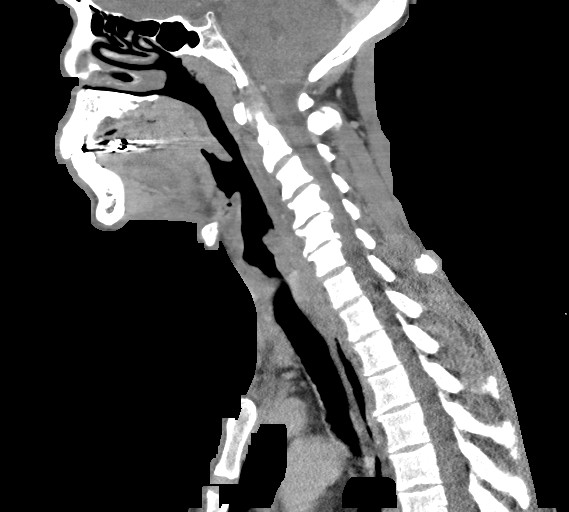
[im 51/101  bone]
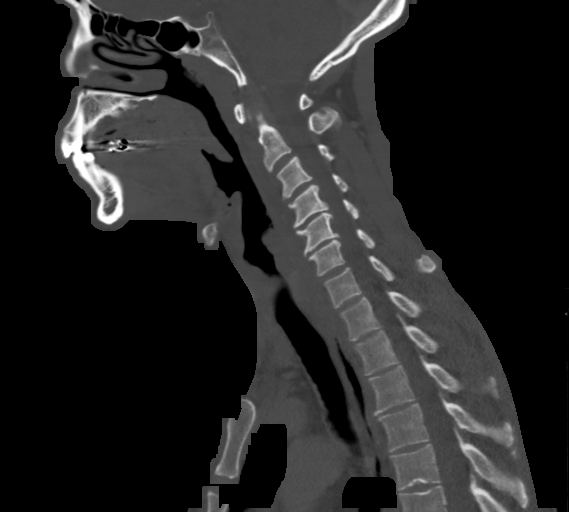
[im 59/101  bone]
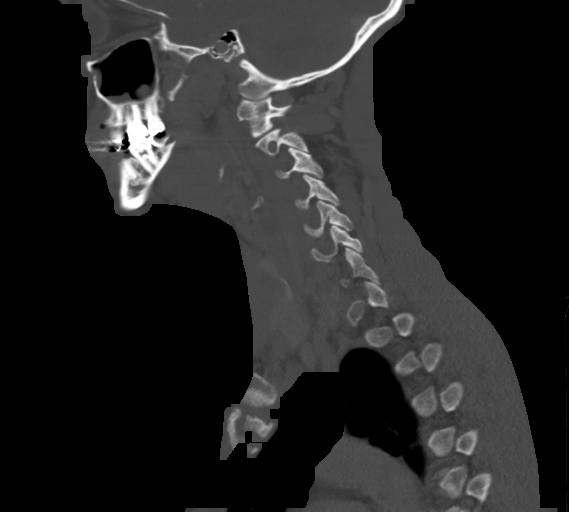
[im 67/101  bone]
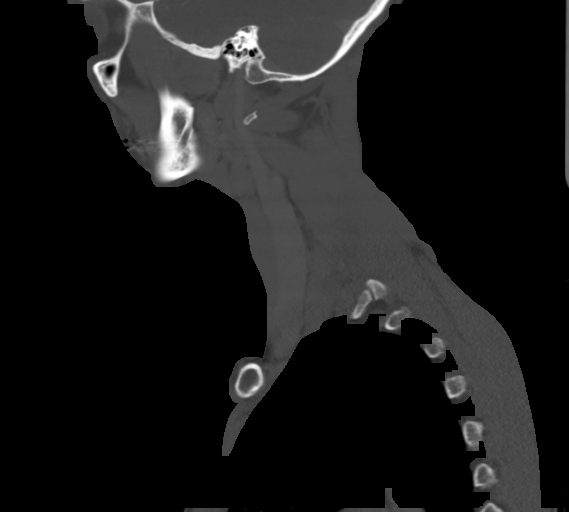

[Series 7: cor neck · coronal · 0.56mm/px · 3 of 151 slices shown]
[im 31/151  bone]
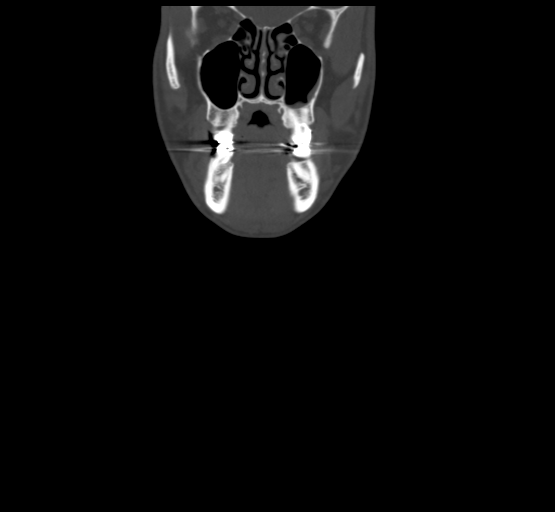
[im 61/151  bone]
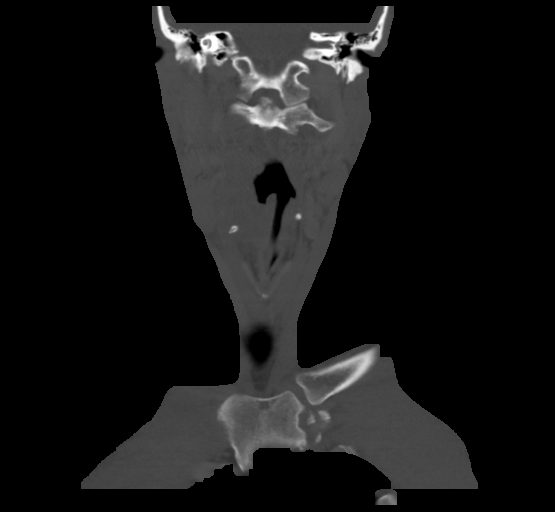
[im 91/151  bone]
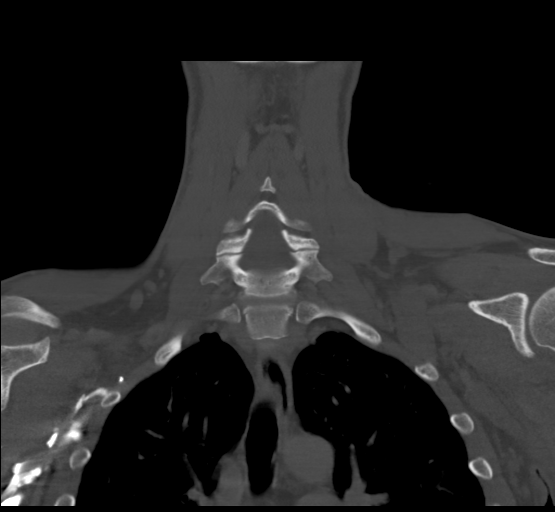

[Series 8: orthogonal ax · axial · 0.39mm/px · z∈[-429,-188]mm · 5 of 186 slices shown, 7 images]
[im 27/186  soft-tissue]
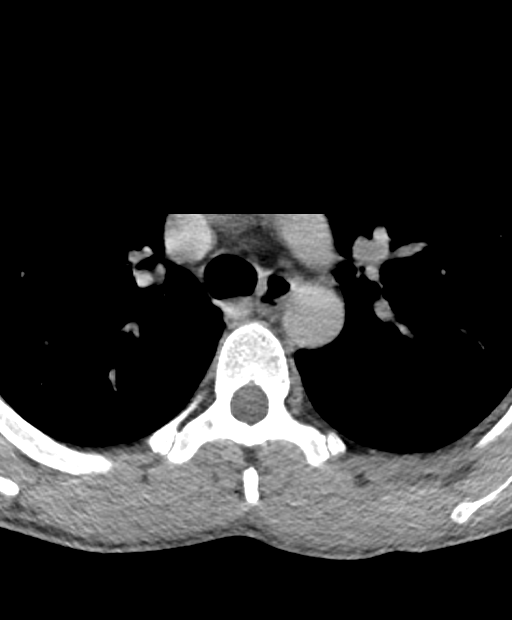
[im 27/186  bone]
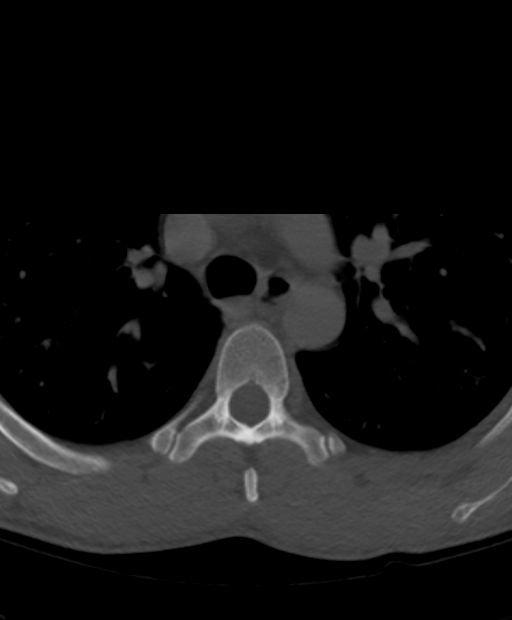
[im 53/186  bone]
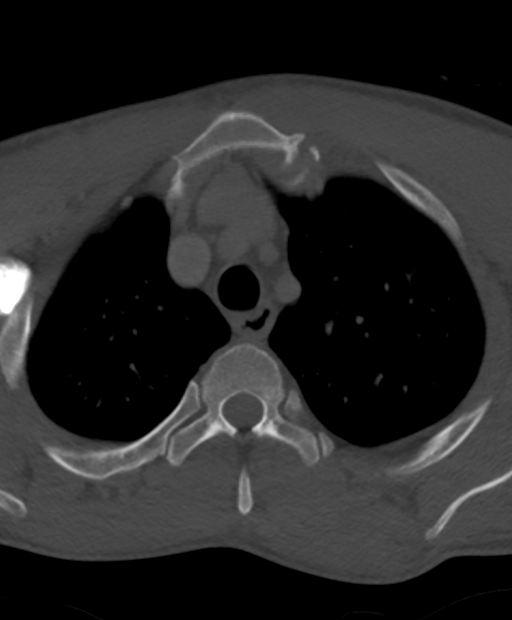
[im 106/186  bone]
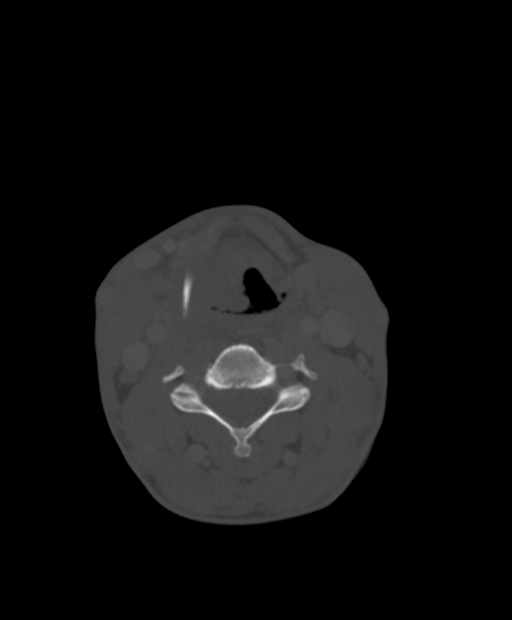
[im 133/186  bone]
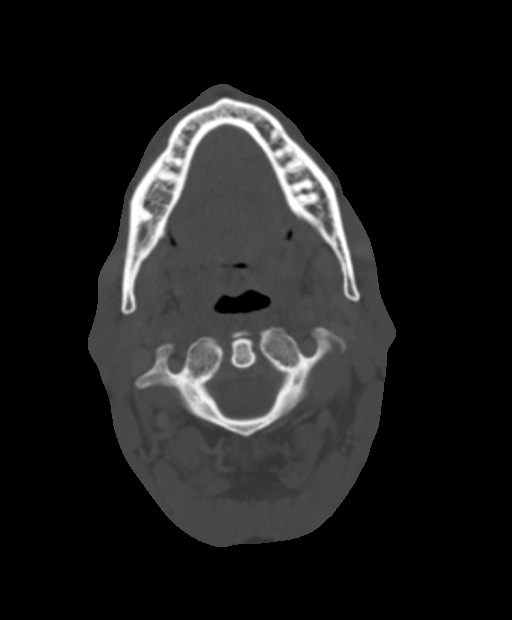
[im 159/186  soft-tissue]
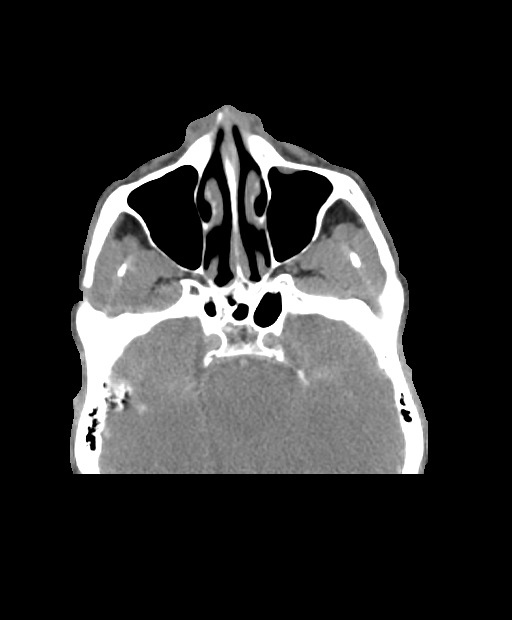
[im 159/186  bone]
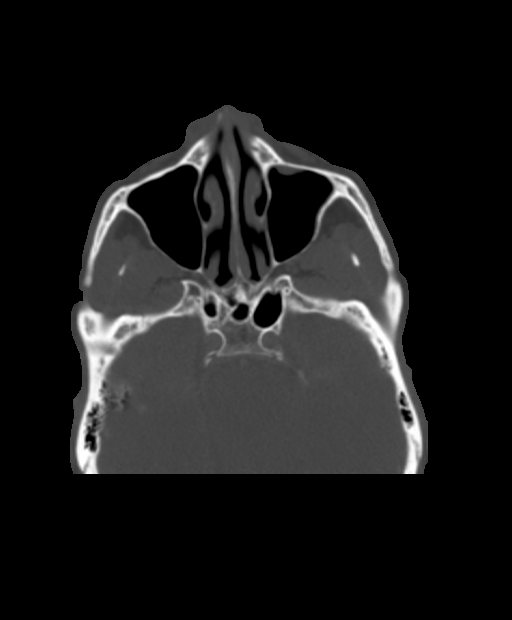

[13 of 33 positions shown; findings below may reference images not displayed]

FINDINGS: Pharynx and larynx: There is prominent asymmetric low-density
swelling involving the right lateral oropharyngeal soft tissues,
greatest inferiorly. Edema extends into the hypopharynx and
supraglottic larynx with asymmetric involvement of the right
aryepiglottic fold. The epiglottis is mildly thickened diffusely.
Edema/inflammation is present in the right greater than left
parapharyngeal spaces as well as in the pre epiglottic and right
paraglottic fat. Retropharyngeal edema and small volume fluid are
present measuring up to 6 mm in AP thickness without significant
mass effect or rim enhancement. There is no organized peritonsillar
fluid collection. The airway remains patent without significant
narrowing.

Salivary glands: No inflammation, mass, or stone.

Thyroid: Unremarkable.

Lymph nodes: Mild bilateral anterior cervical lymphadenopathy,
likely reactive with level II a lymph nodes measuring up to 14 mm in
short axis on the right and 11 mm on the left.

Vascular: Major vascular structures of the neck are patent with mild
carotid atherosclerosis noted.

Limited intracranial: Unremarkable.

Visualized orbits: Unremarkable.

Mastoids and visualized paranasal sinuses: Mild left maxillary sinus
mucosal thickening clear mastoid air cells.

Skeleton: Dental caries.  Mild cervical spondylosis.

Upper chest: Clear lung apices. Thin walled subpleural cyst medially
in the left upper lobe.

Other: None.
IMPRESSION: 1. Findings compatible with pharyngitis and supraglottitis with
extensive right-sided edema/phlegmon. No peritonsillar abscess.
2. Mild epiglottic edema.  Patent airway.
3. Small volume retropharyngeal fluid, favor effusion over abscess.

## 2018-04-11 MED ORDER — DEXAMETHASONE SODIUM PHOSPHATE 10 MG/ML IJ SOLN
10.0000 mg | Freq: Once | INTRAMUSCULAR | Status: AC
  Administered 2018-04-11: 10 mg via INTRAVENOUS
  Filled 2018-04-11: qty 1

## 2018-04-11 MED ORDER — SODIUM CHLORIDE 0.9 % IV BOLUS
1000.0000 mL | Freq: Once | INTRAVENOUS | Status: AC
  Administered 2018-04-11: 1000 mL via INTRAVENOUS

## 2018-04-11 MED ORDER — NICOTINE 21 MG/24HR TD PT24
21.0000 mg | MEDICATED_PATCH | Freq: Every day | TRANSDERMAL | Status: DC
  Administered 2018-04-11 – 2018-04-12 (×2): 21 mg via TRANSDERMAL
  Filled 2018-04-11 (×2): qty 1

## 2018-04-11 MED ORDER — SODIUM CHLORIDE 0.9 % IV SOLN
1.0000 g | INTRAVENOUS | Status: AC
  Administered 2018-04-12: 1 g via INTRAVENOUS
  Filled 2018-04-11: qty 1

## 2018-04-11 MED ORDER — ACETAMINOPHEN 325 MG PO TABS: 650.0000 mg | ORAL_TABLET | Freq: Four times a day (QID) | ORAL | Status: DC | PRN

## 2018-04-11 MED ORDER — ENOXAPARIN SODIUM 40 MG/0.4ML ~~LOC~~ SOLN
40.0000 mg | SUBCUTANEOUS | Status: DC
  Filled 2018-04-11: qty 0.4

## 2018-04-11 MED ORDER — SODIUM CHLORIDE 0.9 % IV SOLN
INTRAVENOUS | Status: DC
  Administered 2018-04-11 – 2018-04-12 (×2): via INTRAVENOUS

## 2018-04-11 MED ORDER — ONDANSETRON HCL 4 MG/2ML IJ SOLN: 4.0000 mg | Freq: Four times a day (QID) | INTRAMUSCULAR | Status: DC | PRN

## 2018-04-11 MED ORDER — MAGIC MOUTHWASH
15.0000 mL | Freq: Four times a day (QID) | ORAL | Status: DC | PRN
  Administered 2018-04-11: 15 mL via ORAL
  Filled 2018-04-11 (×2): qty 20

## 2018-04-11 MED ORDER — MORPHINE SULFATE (PF) 4 MG/ML IV SOLN
4.0000 mg | Freq: Once | INTRAVENOUS | Status: AC
  Administered 2018-04-11: 4 mg via INTRAVENOUS
  Filled 2018-04-11: qty 1

## 2018-04-11 MED ORDER — TRAMADOL HCL 50 MG PO TABS
50.0000 mg | ORAL_TABLET | Freq: Four times a day (QID) | ORAL | Status: DC | PRN
  Administered 2018-04-11 – 2018-04-12 (×3): 50 mg via ORAL
  Filled 2018-04-11 (×3): qty 1

## 2018-04-11 MED ORDER — KETOROLAC TROMETHAMINE 30 MG/ML IJ SOLN
15.0000 mg | Freq: Three times a day (TID) | INTRAMUSCULAR | Status: DC | PRN
  Administered 2018-04-11 – 2018-04-12 (×2): 15 mg via INTRAVENOUS
  Filled 2018-04-11 (×2): qty 1

## 2018-04-11 MED ORDER — ONDANSETRON HCL 4 MG PO TABS: 4.0000 mg | ORAL_TABLET | Freq: Four times a day (QID) | ORAL | Status: DC | PRN

## 2018-04-11 MED ORDER — ACETAMINOPHEN 650 MG RE SUPP: 650.0000 mg | Freq: Four times a day (QID) | RECTAL | Status: DC | PRN

## 2018-04-11 MED ORDER — SENNOSIDES-DOCUSATE SODIUM 8.6-50 MG PO TABS: 1.0000 | ORAL_TABLET | Freq: Every evening | ORAL | Status: DC | PRN

## 2018-04-11 MED ORDER — IOHEXOL 300 MG/ML  SOLN
75.0000 mL | Freq: Once | INTRAMUSCULAR | Status: AC | PRN
  Administered 2018-04-11: 75 mL via INTRAVENOUS
  Filled 2018-04-11: qty 75

## 2018-04-11 MED ORDER — ONDANSETRON HCL 4 MG/2ML IJ SOLN
4.0000 mg | Freq: Once | INTRAMUSCULAR | Status: AC
  Administered 2018-04-11: 4 mg via INTRAVENOUS
  Filled 2018-04-11: qty 2

## 2018-04-11 MED ORDER — CEFAZOLIN SODIUM-DEXTROSE 1-4 GM/50ML-% IV SOLN
1.0000 g | Freq: Once | INTRAVENOUS | Status: AC
  Administered 2018-04-11: 1 g via INTRAVENOUS
  Filled 2018-04-11: qty 50

## 2018-04-11 MED ORDER — DEXAMETHASONE SODIUM PHOSPHATE 10 MG/ML IJ SOLN
10.0000 mg | Freq: Two times a day (BID) | INTRAMUSCULAR | Status: DC
  Administered 2018-04-12: 10 mg via INTRAVENOUS
  Filled 2018-04-11: qty 1

## 2018-04-11 NOTE — H&P (Signed)
[facility] - [place] at [facility]   PATIENT NAME: Benjamin Obrien    MR#:  [id number]  DATE OF BIRTH:  May 02, 1975  DATE OF ADMISSION:  04/11/2018  PRIMARY CARE PHYSICIAN: Patient, No Pcp Per   REQUESTING/REFERRING PHYSICIAN:   CHIEF COMPLAINT:   Chief Complaint  Patient presents with  . Sore Throat    HISTORY OF PRESENT ILLNESS: Earl Parmley  is a 43 y.o. male with a known history of pancreatitis in the past, tobacco abuse presented to the emergency room for sore throat since Thursday.  Has a lot of pain in the throat unable to swallow solids and liquids.  Has some cough.  Was evaluated in the emergency room was found to have inflamed pharynx and epiglottis along with epiglottic edema.  Patient was positive for strep throat.  He was given IV Ancef antibiotic 1 dose in the emergency room.  ENT consultation was done and the ENT planning to do direct laryngoscopy.  Patient unable to eat because of the pain in the throat.  No history of recent travel, sick contacts at home.  No difficulty breathing and maintaining oxygen saturation. Hospitalist service was consulted for further care.  PAST MEDICAL HISTORY:   Past Medical History:  Diagnosis Date  . Pancreatitis     PAST SURGICAL HISTORY:  Past Surgical History:  Procedure Laterality Date  . INTRAMEDULLARY (IM) NAIL INTERTROCHANTERIC Left 08/24/2017   Procedure: INTRAMEDULLARY (IM) NAIL INTERTROCHANTRIC-SHORT AFFIXUS;  Surgeon: Kennedy Bucker, MD;  Location: [facility] ORS;  Service: Orthopedics;  Laterality: Left;  . TOOTH EXTRACTION      SOCIAL HISTORY:  Social History   Tobacco Use  . Smoking status: Current Every Day Smoker    Packs/day: 1.00    Types: Cigarettes  . Smokeless tobacco: Never Used  Substance Use Topics  . Alcohol use: Yes    Comment: Saturdays    FAMILY HISTORY: History reviewed. No pertinent family history.  DRUG ALLERGIES: No Known Allergies  REVIEW OF SYSTEMS:   CONSTITUTIONAL: No  fever, fatigue or weakness.  EYES: No blurred or double vision.  EARS, NOSE, AND THROAT: No tinnitus or ear pain.  Has sore throat RESPIRATORY: No cough, shortness of breath, wheezing or hemoptysis.  CARDIOVASCULAR: No chest pain, orthopnea, edema.  GASTROINTESTINAL: No nausea, vomiting, diarrhea or abdominal pain.  GENITOURINARY: No dysuria, hematuria.  ENDOCRINE: No polyuria, nocturia,  HEMATOLOGY: No anemia, easy bruising or bleeding SKIN: No rash or lesion. MUSCULOSKELETAL: No joint pain or arthritis.   NEUROLOGIC: No tingling, numbness, weakness.  PSYCHIATRY: No anxiety or depression.   MEDICATIONS AT HOME:  Prior to Admission medications   Medication Sig Start Date End Date Taking? Authorizing Provider  cephALEXin (KEFLEX) 500 MG capsule Take 1 capsule (500 mg total) by mouth 3 (three) times daily. Patient not taking: Reported on 04/11/2018 09/08/17   Faythe Ghee, PA-C  traMADol (ULTRAM) 50 MG tablet Take 1 tablet (50 mg total) by mouth every 6 (six) hours as needed. Patient not taking: Reported on 04/11/2018 09/08/17   Faythe Ghee, PA-C      PHYSICAL EXAMINATION:   VITAL SIGNS: Blood pressure 115/69, pulse 94, temperature 98.4 F (36.9 C), temperature source Oral, resp. rate 16, height 5\' 8"  (1.727 m), weight 71.7 kg, SpO2 99 %.  GENERAL:  43 y.o.-year-old patient lying in the bed with no acute distress.  EYES: Pupils equal, round, reactive to light and accommodation. No scleral icterus. Extraocular muscles intact.  HEENT: Head atraumatic, normocephalic. Oropharynx inflamed  Edema noted in pharynx NECK:  Supple, no jugular venous distention. No thyroid enlargement, no tenderness.  LUNGS: Normal breath sounds bilaterally, no wheezing, rales,rhonchi or crepitation. No use of accessory muscles of respiration.  CARDIOVASCULAR: S1, S2 normal. No murmurs, rubs, or gallops.  ABDOMEN: Soft, nontender, nondistended. Bowel sounds present. No organomegaly or mass.  EXTREMITIES:  No pedal edema, cyanosis, or clubbing.  NEUROLOGIC: Cranial nerves II through XII are intact. Muscle strength 5/5 in all extremities. Sensation intact. Gait not checked.  PSYCHIATRIC: The patient is alert and oriented x 3.  SKIN: No obvious rash, lesion, or ulcer.   LABORATORY PANEL:   CBC Recent Labs  Lab 04/11/18 1323  WBC 19.3*  HGB 13.9  HCT 42.4  PLT 408*  MCV 94.0  MCH 30.8  MCHC 32.8  RDW 12.3  LYMPHSABS 1.8  MONOABS 1.7*  EOSABS 0.3  BASOSABS 0.1   ------------------------------------------------------------------------------------------------------------------  Chemistries  Recent Labs  Lab 04/11/18 1323  NA 139  K 4.5  CL 104  CO2 28  GLUCOSE 105*  BUN 14  CREATININE 0.92  CALCIUM 9.2  AST 13*  ALT 10  ALKPHOS 65  BILITOT 0.4   ------------------------------------------------------------------------------------------------------------------ estimated creatinine clearance is 100.2 mL/min (by C-G formula based on SCr of 0.92 mg/dL). ------------------------------------------------------------------------------------------------------------------ No results for input(s): TSH, T4TOTAL, T3FREE, THYROIDAB in the last 72 hours.  Invalid input(s): FREET3   Coagulation profile No results for input(s): INR, PROTIME in the last 168 hours. ------------------------------------------------------------------------------------------------------------------- No results for input(s): DDIMER in the last 72 hours. -------------------------------------------------------------------------------------------------------------------  Cardiac Enzymes No results for input(s): CKMB, TROPONINI, MYOGLOBIN in the last 168 hours.  Invalid input(s): CK ------------------------------------------------------------------------------------------------------------------ Invalid input(s):  POCBNP  ---------------------------------------------------------------------------------------------------------------  Urinalysis    Component Value Date/Time   COLORURINE YELLOW (A) 01/28/2015 1151   APPEARANCEUR CLEAR (A) 01/28/2015 1151   LABSPEC 1.019 01/28/2015 1151   PHURINE 5.0 01/28/2015 1151   GLUCOSEU NEGATIVE 01/28/2015 1151   HGBUR 1+ (A) 01/28/2015 1151   BILIRUBINUR NEGATIVE 01/28/2015 1151   KETONESUR NEGATIVE 01/28/2015 1151   PROTEINUR NEGATIVE 01/28/2015 1151   NITRITE NEGATIVE 01/28/2015 1151   LEUKOCYTESUR NEGATIVE 01/28/2015 1151     RADIOLOGY: Ct Soft Tissue Neck W Contrast  Result Date: 04/11/2018 CLINICAL DATA:  Sore throat, swelling, and pain with swallowing. Hoarseness. Cough and congestion. Leukocytosis. EXAM: CT NECK WITH CONTRAST TECHNIQUE: Multidetector CT imaging of the neck was performed using the standard protocol following the bolus administration of intravenous contrast. CONTRAST:  2881mFort EveThomaseStroud Regional Medical Ce7961mtShawEveThomaseAvera St Mary'S Hosp7257mtSuncoast EveThomaseNaval Health Clinic (John Henry Ba8810Thom618msMiEveThomaseSawtooth Behavioral He81023mlBEveThomaseUniversity Medical Center At Princ7297mtSouth SEveThomaseAnmed Health Rehabilitation Hosp648mtEveThomaseAdventhealth Wauc728mEveThomaseSentara Kitty Hawk5123mAEveThomaseAllegiance Specialty Hospital Of Kil5918moWashington CourEveThomaseBristol Ambulatory Surger Ce701mtAnEveThomaseGood Samaritan Regional Health Center Mt Ve440mEveThomaseProvidence Medford Medical Ce6176mtWake EveThomaseWesley Caguas Hosp6100mEveThomaseRiverbridge Specialty Hosp8499mtEveThomaseTift Regional Medical Ce3443mEveThomaseMemorial Hermann Tomball Hosp70EveThomaseSurgical Hospital At Southwoodse MohairE IOHEXOL 300 MG/ML  SOLN COMPARISON:  Head and cervical spine CTs 08/23/2017. Maxillofacial CT 12/01/2011. FINDINGS: Pharynx and larynx: There is prominent asymmetric low-density swelling involving the right lateral oropharyngeal soft tissues, greatest inferiorly. Edema extends into the hypopharynx and supraglottic larynx with asymmetric involvement of the right aryepiglottic fold. The epiglottis is mildly thickened diffusely. Edema/inflammation is present in the right greater than left parapharyngeal spaces as well as in the pre epiglottic and right paraglottic fat. Retropharyngeal edema and small volume fluid are present measuring up to 6 mm in AP thickness without significant mass effect or rim enhancement. There is no organized peritonsillar fluid collection. The airway remains patent without significant narrowing. Salivary glands: No inflammation, mass, or stone. Thyroid: Unremarkable. Lymph nodes: Mild bilateral anterior cervical lymphadenopathy, likely  reactive with level II a lymph nodes measuring up to 14 mm in short axis on the right and 11 mm on the left. Vascular: Major vascular structures of the neck are patent with mild carotid atherosclerosis noted. Limited intracranial: Unremarkable.  Visualized orbits: Unremarkable. Mastoids and visualized paranasal sinuses: Mild left maxillary sinus mucosal thickening clear mastoid air cells. Skeleton: Dental caries.  Mild cervical spondylosis. Upper chest: Clear lung apices. Thin walled subpleural cyst medially in the left upper lobe. Other: None. IMPRESSION: 1. Findings compatible with pharyngitis and supraglottitis with extensive right-sided edema/phlegmon. No peritonsillar abscess. 2. Mild epiglottic edema.  Patent airway. 3. Small volume retropharyngeal fluid, favor effusion over abscess. Electronically Signed   By: Sebastian Ache M.D.   On: 04/11/2018 15:25    EKG: Orders placed or performed during the hospital encounter of 08/23/17  . ED EKG  . ED EKG  . EKG 12-Lead  . EKG 12-Lead    IMPRESSION AND PLAN:  43 year old male patient with history of pancreatitis, tobacco abuse presented to the emergency room with sore throat  -Acute pharyngitis strep throat IV fluids and antibiotics  -Acute epiglottitis IV Rocephin antibiotic 1 g daily ENT consult for epiglottic edema for evaluation Decadron 10 mg every 12 hourly for edema  -Odynophagia secondary to pharyngitis Magic mouthwash and symptomatic care IV fluids secondary to poor oral intake  -Tobacco abuse Tobacco cessation counseled to the patient for 6 minutes Nicotine patch offered  -DVT prophylaxis subcu Lovenox daily and sequential compression device to lower extremities  -Leukocytosis secondary to pharyngitis IV antibiotics and follow-up WBC count  All the records are reviewed and case discussed with ED provider. Management plans discussed with the patient, family and they are in agreement.  CODE STATUS:Full code Code Status  History    Date Active Date Inactive Code Status Order ID Comments User Context   08/24/2017 0212 08/25/2017 2010 Full Code [id number]  Cammy Copa, MD Inpatient       TOTAL TIME TAKING CARE OF THIS PATIENT: 53 minutes.    Ihor Austin M.D on 04/11/2018 at 4:09 PM  Between 7am to 6pm - Pager - [phone]  After 6pm go to [email or web address] - password [email or web address]  [facility]  Office  [phone]  CC: Primary care physician; Patient, No Pcp Per

## 2018-04-11 NOTE — ED Provider Notes (Signed)
[facility] Emergency Department Provider Note  ____________________________________________   First MD Initiated Contact with Patient 04/11/18 1317     (approximate)  I have reviewed the triage vital signs and the nursing notes.   HISTORY  Chief Complaint Sore Throat    HPI Benjamin Obrien is a 43 y.o. male presents emergency department complaint of sore throat with swelling to the right side of his neck since Friday.  He states he has a lot of pain when he swallows.  His voice has been hoarse.  States that he felt like his throat was closing up over the weekend and he was choking which woke him up.  He states he is unsure if he has had fever or not.  He denies any chest pain or shortness of breath.  Denies any injury to the neck or throat.    Past Medical History:  Diagnosis Date  . Pancreatitis     Patient Active Problem List   Diagnosis Date Noted  . 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 Hip fracture (HCC) 08/23/2017    Past Surgical History:  Procedure Laterality Date  . INTRAMEDULLARY (IM) NAIL INTERTROCHANTERIC Left 08/24/2017   Procedure: INTRAMEDULLARY (IM) NAIL INTERTROCHANTRIC-SHORT AFFIXUS;  Surgeon: Kennedy BuckerMenz, Michael, MD;  Location: [facility] ORS;  Service: Orthopedics;  Laterality: Left;  . TOOTH EXTRACTION      Prior to Admission medications   Medication Sig Start Date End Date Taking? Authorizing Provider  cephALEXin (KEFLEX) 500 MG capsule Take 1 capsule (500 mg total) by mouth 3 (three) times daily. Patient not taking: Reported on 04/11/2018 09/08/17   Faythe GheeFisher,  W, PA-C  traMADol (ULTRAM) 50 MG tablet Take 1 tablet (50 mg total) by mouth every 6 (six) hours as needed. Patient not taking: Reported on 04/11/2018 09/08/17   Faythe GheeFisher,  W, PA-C    Allergies Patient has no known allergies.  History reviewed. No pertinent family history.  Social History Social History   Tobacco Use  . Smoking status: Current Every Day Smoker    Packs/day:  1.00    Types: Cigarettes  . Smokeless tobacco: Never Used  Substance Use Topics  . Alcohol use: Yes    Comment: Saturdays  . Drug use: Not on file    Review of Systems  Constitutional: No fever/chills Eyes: No visual changes. ENT: Positive sore throat. Respiratory: Denies cough Genitourinary: Negative for dysuria. Musculoskeletal: Negative for back pain. Skin: Negative for rash.    ____________________________________________   PHYSICAL EXAM:  VITAL SIGNS: ED Triage Vitals  Enc Vitals Group     BP 04/11/18 1237 115/69     Pulse Rate 04/11/18 1237 94     Resp 04/11/18 1237 16     Temp 04/11/18 1237 98.4 F (36.9 C)     Temp Source 04/11/18 1237 Oral     SpO2 04/11/18 1237 99 %     Weight 04/11/18 1240 158 lb (71.7 kg)     Height 04/11/18 1240 5\' 8"  (1.727 m)     Head Circumference --      Peak Flow --      Pain Score 04/11/18 1240 10     Pain Loc --      Pain Edu? --      Excl. in GC? --     Constitutional: Alert and oriented. Well appearing and in no acute distress. Eyes: Conjunctivae are normal.  Head: Atraumatic. Nose: No congestion/rhinnorhea. Mouth/Throat: Mucous membranes are moist.  Throat is mildly red with some swelling at the  tonsils bilaterally. Neck:  supple no lymphadenopathy noted, the right side of the neck has some swelling noted along with tonsillar area Cardiovascular: Normal rate, regular rhythm. Heart sounds are normal Respiratory: Normal respiratory effort.  No retractions, lungs c t a  GU: deferred Musculoskeletal: FROM all extremities, warm and well perfused Neurologic:  Normal speech and language.  Skin:  Skin is warm, dry and intact. No rash noted. Psychiatric: Mood and affect are normal. Speech and behavior are normal.  ____________________________________________   LABS (all labs ordered are listed, but only abnormal results are displayed)  Labs Reviewed  GROUP A STREP BY PCR - Abnormal; Notable for the following components:       Result Value   Group A Strep by PCR DETECTED (*)    All other components within normal limits  COMPREHENSIVE METABOLIC PANEL - Abnormal; Notable for the following components:   Glucose, Bld 105 (*)    AST 13 (*)    All other components within normal limits  CBC WITH DIFFERENTIAL/PLATELET - Abnormal; Notable for the following components:   WBC 19.3 (*)    Platelets 408 (*)    Neutro Abs 15.3 (*)    Monocytes Absolute 1.7 (*)    Abs Immature Granulocytes 0.13 (*)    All other components within normal limits   ____________________________________________   ____________________________________________  RADIOLOGY  CT soft tissue of the neck  ____________________________________________   PROCEDURES  Procedure(s) performed: Saline lock, normal saline 1 L IV, morphine 4 mg IV, Zofran 4 mg IV, Decadron 10 mg IV, Ancef IV  Procedures    ____________________________________________   INITIAL IMPRESSION / ASSESSMENT AND PLAN / ED COURSE  Pertinent labs & imaging results that were available during my care of the patient were reviewed by me and considered in my medical decision making (see chart for details).   Patient is 43 year old male presents emergency department with severe sore throat.  Physical exam shows an irritated throat.  Enlargement of swelling noted on the right side.  Strep test is positive, CBC has elevated WBC at 19, metabolic panel is basically normal  CT soft tissue of the neck  Patient was given 1 L normal saline IV, Zofran 4 mg IV, morphine 4 mg IV, Ancef IV, Decadron 10 mg IV, repeat morphine 4 mg IV.   Discussed case with Dr. Derrill Kay.  Advises me to call ENT.  Discussed with Dr. Willeen Cass.  Dr. Willeen Cass has concerns with epiglottic edema this could turn into epiglottitis and the patient should be observed overnight.  He recommends we call the hospitalist for admission.  He will come to the emergency department to see the patient to perform a  scope. Paged hospitalist.  Dr. Tobi Bastos is willing to admit the patient.  He is accepting care of the patient at this time.    As part of my medical decision making, I reviewed the following data within the electronic MEDICAL RECORD NUMBER Nursing notes reviewed and incorporated, Labs reviewed CBC has WBC of 19, strep test is positive, comprehensive metabolic panel is normal, Old chart reviewed, Radiograph reviewed CT of the soft tissues of the neck shows supraglottitis, pharyngeal edema, and epiglottic edema, Discussed with admitting physician Dr. Tobi Bastos, A consult was requested and obtained from this/these consultant(s) ENT, Notes from prior ED visits and [place] Controlled Substance Database  ____________________________________________   FINAL CLINICAL IMPRESSION(S) / ED DIAGNOSES  Final diagnoses:  Acute streptococcal pharyngitis  Supraglottic edema      NEW MEDICATIONS STARTED DURING THIS  VISIT:  New Prescriptions   No medications on file     Note:  This document was prepared using Dragon voice recognition software and may include unintentional dictation errors.    Faythe GheeFisher,  W, PA-C 04/11/18 1606    Phineas SemenGoodman, Graydon, MD 04/12/18 [phone]

## 2018-04-11 NOTE — Progress Notes (Signed)
Advanced care plan.  Purpose of the Encounter: CODE STATUS  Parties in Attendance: Patient  Patient's Decision Capacity: Good  Subjective/Patient's story: Presented to emergency room for sore throat and pain on swallowing   Objective/Medical story Has acute pharyngitis and epiglottitis Needs IV fluids antibiotics and ENT evaluation Has tobacco abuse  Goals of care determination:  Advance care directives goals of care and treatment plan discussed Tobacco cessation discussed For now patient wants everything done which includes CPR, intubation ventilator if the need arises   CODE STATUS: Full code   Time spent discussing advanced care planning: 16 minutes

## 2018-04-11 NOTE — ED Triage Notes (Signed)
Pt to ED reporting sore throat and swelling since Friday. PT reports pain when swallowing. Voice is hoarse. Cough and congestion present.

## 2018-04-11 NOTE — Consult Note (Signed)
Benjamin Obrien, Benjamin Obrien [id number] 1975/04/05 Benjamin MealyPaul S Cottrell Gentles, MD  Reason for Consult: Epiglottitis Requesting Physician: Benjamin AustinPyreddy, Pavan, MD Consulting Physician: Benjamin Obrien  HPI: This 43 y.o. year old male was admitted on 04/11/2018 for feels like throat closing. He has had symptoms of sore throat worsening since last Thursday. Unaware of any fever with this. Has had increasing pain with pain into the ear and jaw, and increased difficulty swallowing, unable to eat the past 2 days. Some difficulty breathing if he lies flat, but breathing comfortably upright. Positive for strep here in ER, but got CT due to symptoms out of proportion to findings and noted to have epiglottic edema on scan.  Allergies: No Known Allergies  Medications: (Not in a hospital admission) .  Current Facility-Administered Medications  Medication Dose Route Frequency Provider Last Rate Last Dose  . 0.9 %  sodium chloride infusion   Intravenous Continuous Pyreddy, Vivien RotaPavan, MD      . Melene Muller[START ON 04/12/2018] cefTRIAXone (ROCEPHIN) 1 g in sodium chloride 0.9 % 100 mL IVPB  1 g Intravenous Q24H Pyreddy, Vivien RotaPavan, MD      . Melene Muller[START ON 04/12/2018] dexamethasone (DECADRON) injection 10 mg  10 mg Intravenous Q12H Pyreddy, Vivien RotaPavan, MD      . magic mouthwash  15 mL Oral QID PRN Pyreddy, Pavan, MD      . nicotine (NICODERM CQ - dosed in mg/24 hours) patch 21 mg  21 mg Transdermal Daily Pyreddy, Vivien RotaPavan, MD       Current Outpatient Medications  Medication Sig Dispense Refill  . cephALEXin (KEFLEX) 500 MG capsule Take 1 capsule (500 mg total) by mouth 3 (three) times daily. (Patient not taking: Reported on 04/11/2018) 21 capsule 0  . traMADol (ULTRAM) 50 MG tablet Take 1 tablet (50 mg total) by mouth every 6 (six) hours as needed. (Patient not taking: Reported on 04/11/2018) 15 tablet 0    PMH:  Past Medical History:  Diagnosis Date  . Pancreatitis     Fam Hx: History reviewed. No pertinent family history.  Soc Hx:  Social History    Socioeconomic History  . Marital status: Single    Spouse name: Not on file  . Number of children: Not on file  . Years of education: Not on file  . Highest education level: Not on file  Occupational History  . Not on file  Social Needs  . Financial resource strain: Not on file  . Food insecurity:    Worry: Not on file    Inability: Not on file  . Transportation needs:    Medical: Not on file    Non-medical: Not on file  Tobacco Use  . Smoking status: Current Every Day Smoker    Packs/day: 1.00    Types: Cigarettes  . Smokeless tobacco: Never Used  Substance and Sexual Activity  . Alcohol use: Yes    Comment: Saturdays  . Drug use: Not on file  . Sexual activity: Not on file  Lifestyle  . Physical activity:    Days per week: Not on file    Minutes per session: Not on file  . Stress: Not on file  Relationships  . Social connections:    Talks on phone: Not on file    Gets together: Not on file    Attends religious service: Not on file    Active member of club or organization: Not on file    Attends meetings of clubs or organizations: Not on file    Relationship status: Not on  file  . Intimate partner violence:    Fear of current or ex partner: Not on file    Emotionally abused: Not on file    Physically abused: Not on file    Forced sexual activity: Not on file  Other Topics Concern  . Not on file  Social History Narrative  . Not on file    PSH:  Past Surgical History:  Procedure Laterality Date  . INTRAMEDULLARY (IM) NAIL INTERTROCHANTERIC Left 08/24/2017   Procedure: INTRAMEDULLARY (IM) NAIL INTERTROCHANTRIC-SHORT AFFIXUS;  Surgeon: Kennedy BuckerMenz, Michael, MD;  Location: [facility] ORS;  Service: Orthopedics;  Laterality: Left;  . TOOTH EXTRACTION    . Procedures since admission: No admission procedures for hospital encounter.  ROS: Review of systems normal other than 12 systems except per HPI.  PHYSICAL EXAM Vitals:  Vitals:   04/11/18 1237  BP: 115/69  Pulse: 94   Resp: 16  Temp: 98.4 F (36.9 C)  SpO2: 99%  . General: Well-developed, Well-nourished. No distress though uncomfortable. No stridor. Breathing is not labored. Mood: Mood and affect well adjusted, pleasant and cooperative. Orientation: Grossly alert and oriented. Vocal Quality: No hoarseness. Communicates verbally. head and Face: NCAT. No facial asymmetry. No visible skin lesions. No significant facial scars. No tenderness with sinus percussion. Facial strength normal and symmetric. Ears: External ears with normal landmarks, no lesions. External auditory canals free of infection, cerumen impaction or lesions. Tympanic membranes intact with good landmarks and normal mobility on pneumatic otoscopy. No middle ear effusion. Hearing: Speech reception grossly normal. Nose: External nose normal with midline dorsum and no lesions or deformity. Nasal Cavity reveals essentially midline septum with normal inferior turbinates. No significant mucosal congestion or erythema. Nasal secretions are minimal and clear. No polyps seen on anterior rhinoscopy. Oral Cavity/ Oropharynx: Lips are normal with no lesions. Teeth no frank dental caries. Gingiva healthy with no lesions or gingivitis. Oropharynx including tongue, buccal mucosa, floor of mouth, hard and soft palate, uvula and posterior pharynx free of exudates, erythema or lesions. Maybe some mild tonsillar edema but no exudate and tonsils are 2+.  Indirect Laryngoscopy/Nasopharyngoscopy: Visualization of the larynx, hypopharynx and nasopharynx is not possible in this setting with routine examination. Neck: Supple and symmetric with tender bilateral jugulodigastric lymphadenopathy without crepitance. The trachea is midline. Thyroid gland is soft, nontender and symmetric with no masses or enlargement. Parotid and submandibular glands are soft, nontender and symmetric, without masses. Lymphatic: Cervical lymph nodes as above. Respiratory: Normal respiratory effort  without labored breathing. Cardiovascular: Carotid pulse shows regular rate and rhythm Neurologic: Cranial Nerves II through XII are grossly intact. Eyes: Gaze and Ocular Motility are grossly normal. PERRLA. No visible nystagmus.  MEDICAL DECISION MAKING: Data Review:  Results for orders placed or performed during the hospital encounter of 04/11/18 (from the past 48 hour(s))  Group A Strep by PCR ([facility] Only)     Status: Abnormal   Collection Time: 04/11/18  1:23 PM  Result Value Ref Range   Group A Strep by PCR DETECTED (A) NOT DETECTED    Comment: Performed at [facility], [address] [place], [place] [address]  Comprehensive metabolic panel     Status: Abnormal   Collection Time: 04/11/18  1:23 PM  Result Value Ref Range   Sodium 139 135 - 145 mmol/L   Potassium 4.5 3.5 - 5.1 mmol/L   Chloride 104 98 - 111 mmol/L   CO2 28 22 - 32 mmol/L   Glucose, Bld 105 (H) 70 - 99 mg/dL  BUN 14 6 - 20 mg/dL   Creatinine, Ser 1.61 0.61 - 1.24 mg/dL   Calcium 9.2 8.9 - 09.6 mg/dL   Total Protein 7.4 6.5 - 8.1 g/dL   Albumin 3.8 3.5 - 5.0 g/dL   AST 13 (L) 15 - 41 U/L   ALT 10 0 - 44 U/L   Alkaline Phosphatase 65 38 - 126 U/L   Total Bilirubin 0.4 0.3 - 1.2 mg/dL   GFR calc non Af Amer >60 >60 mL/min   GFR calc Af Amer >60 >60 mL/min   Anion gap 7 5 - 15    Comment: Performed at [facility], [address] [place], [place] [address]  CBC with Differential     Status: Abnormal   Collection Time: 04/11/18  1:23 PM  Result Value Ref Range   WBC 19.3 (H) 4.0 - 10.5 K/uL   RBC 4.51 4.22 - 5.81 MIL/uL   Hemoglobin 13.9 13.0 - 17.0 g/dL   HCT 98.1 19.1 - 47.8 %   MCV 94.0 80.0 - 100.0 fL   MCH 30.8 26.0 - 34.0 pg   MCHC 32.8 30.0 - 36.0 g/dL   RDW 29.5 62.1 - 30.8 %   Platelets 408 (H) 150 - 400 K/uL   nRBC 0.0 0.0 - 0.2 %   Neutrophils Relative % 79 %   Neutro Abs 15.3 (H) 1.7 - 7.7 K/uL   Lymphocytes Relative 9 %   Lymphs Abs 1.8 0.7 - 4.0 K/uL   Monocytes  Relative 9 %   Monocytes Absolute 1.7 (H) 0.1 - 1.0 K/uL   Eosinophils Relative 2 %   Eosinophils Absolute 0.3 0.0 - 0.5 K/uL   Basophils Relative 0 %   Basophils Absolute 0.1 0.0 - 0.1 K/uL   Immature Granulocytes 1 %   Abs Immature Granulocytes 0.13 (H) 0.00 - 0.07 K/uL    Comment: Performed at [facility], [address] [place], [place] [address]  . Ct Soft Tissue Neck W Contrast  Result Date: 04/11/2018 CLINICAL DATA:  Sore throat, swelling, and pain with swallowing. Hoarseness. Cough and congestion. Leukocytosis. EXAM: CT NECK WITH CONTRAST TECHNIQUE: Multidetector CT imaging of the neck was performed using the standard protocol following the bolus administration of intravenous contrast. CONTRAST:  75mL OMNIPAQUE IOHEXOL 300 MG/ML  SOLN COMPARISON:  Head and cervical spine CTs 08/23/2017. Maxillofacial CT 12/01/2011. FINDINGS: Pharynx and larynx: There is prominent asymmetric low-density swelling involving the right lateral oropharyngeal soft tissues, greatest inferiorly. Edema extends into the hypopharynx and supraglottic larynx with asymmetric involvement of the right aryepiglottic fold. The epiglottis is mildly thickened diffusely. Edema/inflammation is present in the right greater than left parapharyngeal spaces as well as in the pre epiglottic and right paraglottic fat. Retropharyngeal edema and small volume fluid are present measuring up to 6 mm in AP thickness without significant mass effect or rim enhancement. There is no organized peritonsillar fluid collection. The airway remains patent without significant narrowing. Salivary glands: No inflammation, mass, or stone. Thyroid: Unremarkable. Lymph nodes: Mild bilateral anterior cervical lymphadenopathy, likely reactive with level II a lymph nodes measuring up to 14 mm in short axis on the right and 11 mm on the left. Vascular: Major vascular structures of the neck are patent with mild carotid atherosclerosis noted. Limited  intracranial: Unremarkable. Visualized orbits: Unremarkable. Mastoids and visualized paranasal sinuses: Mild left maxillary sinus mucosal thickening clear mastoid air cells. Skeleton: Dental caries.  Mild cervical spondylosis. Upper chest: Clear lung apices. Thin walled subpleural cyst  medially in the left upper lobe. Other: None. IMPRESSION: 1. Findings compatible with pharyngitis and supraglottitis with extensive right-sided edema/phlegmon. No peritonsillar abscess. 2. Mild epiglottic edema.  Patent airway. 3. Small volume retropharyngeal fluid, favor effusion over abscess. Electronically Signed   By: Sebastian Ache M.D.   On: 04/11/2018 15:25  .   PROCEDURE: Procedure: Diagnostic Fiberoptic Nasolaryngoscopy Diagnosis: Throat pain, supraglottitis Indications: evaluate throat pain, swelling Findings:Nasopharynx clear. Larynx noted to have edema with mild erythema of the right aryepiglottic fold and arytenoid region, partially obscuring the right TVC, which can be briefly seen with phonation. TVC otherwise clear a mobile with no significant erythema/edema. Remainder of epiglottis only has mild edema and the left AE fold is without significant edema. There is edema and erythema of the right pharynx adjacent to the larynx. No mucosal lesions.  Description of Procedure: After discussing procedure and risks  (primarily nose bleed) with the patient, the nose was anesthetized with topical Lidocaine 4% and decongested with phenylephrine. A flexible fiberoptic scope was passed through the nasal cavity. The nasal cavity was inspected and the scope passed through the Nasopharynx to the region of the hypopharynx and larynx. The patient was instructed to phonate to assess vocal cord mobility. The tongue was extended to evaluate the tongue base completely. Valsalva was performed to insufflate the hypopharynx for improved examination. Findings are as noted above. The scope was withdrawn. The patient tolerated the procedure  well.  ASSESSMENT: Supraglottitis/ pharyngitis  PLAN: Agree with management with IV steroids, antibiotics. Currently he is handling his airway and no need for airway intervention, but patient lives at home and certainly needs observation with pulse oximetry. I suspect he will improve with medical management over the next 24 hours, but needs observation in case he develops increased difficulty breathing.    Benjamin Mealy, MD 04/11/2018 4:43 PM

## 2018-04-12 LAB — CBC
HCT: 38.1 % — ABNORMAL LOW (ref 39.0–52.0)
Hemoglobin: 12.4 g/dL — ABNORMAL LOW (ref 13.0–17.0)
MCH: 30.5 pg (ref 26.0–34.0)
MCHC: 32.5 g/dL (ref 30.0–36.0)
MCV: 93.8 fL (ref 80.0–100.0)
Platelets: 422 10*3/uL — ABNORMAL HIGH (ref 150–400)
RBC: 4.06 MIL/uL — ABNORMAL LOW (ref 4.22–5.81)
RDW: 12.1 % (ref 11.5–15.5)
WBC: 22.7 10*3/uL — ABNORMAL HIGH (ref 4.0–10.5)
nRBC: 0 % (ref 0.0–0.2)

## 2018-04-12 LAB — BASIC METABOLIC PANEL
Anion gap: 8 (ref 5–15)
BUN: 11 mg/dL (ref 6–20)
CO2: 23 mmol/L (ref 22–32)
Calcium: 8 mg/dL — ABNORMAL LOW (ref 8.9–10.3)
Chloride: 106 mmol/L (ref 98–111)
Creatinine, Ser: 0.77 mg/dL (ref 0.61–1.24)
GFR calc Af Amer: >60 mL/min (ref >60)
GFR calc non Af Amer: >60 mL/min (ref >60)
Glucose, Bld: 174 mg/dL — ABNORMAL HIGH (ref 70–99)
Potassium: 3.9 mmol/L (ref 3.5–5.1)
Sodium: 137 mmol/L (ref 135–145)

## 2018-04-12 MED ORDER — SODIUM CHLORIDE 0.9 % IV SOLN
INTRAVENOUS | Status: DC | PRN
  Administered 2018-04-12: 10:00:00 via INTRAVENOUS

## 2018-04-12 MED ORDER — NICOTINE 21 MG/24HR TD PT24: 21.0000 mg | MEDICATED_PATCH | Freq: Every day | TRANSDERMAL | 0 refills | Status: DC

## 2018-04-12 MED ORDER — PREDNISONE 50 MG PO TABS
50.0000 mg | ORAL_TABLET | Freq: Every day | ORAL | Status: DC
Start: 2018-04-12 — End: 2018-04-12
  Administered 2018-04-12: 50 mg via ORAL
  Filled 2018-04-12: qty 1

## 2018-04-12 MED ORDER — AMOXICILLIN-POT CLAVULANATE 875-125 MG PO TABS: 1.0000 | ORAL_TABLET | Freq: Two times a day (BID) | ORAL | 0 refills | Status: AC

## 2018-04-12 MED ORDER — PREDNISONE 10 MG PO TABS: 10.0000 mg | ORAL_TABLET | Freq: Every day | ORAL | 0 refills | Status: DC

## 2018-04-12 MED ORDER — PENICILLIN V POTASSIUM 500 MG PO TABS: 500.0000 mg | ORAL_TABLET | Freq: Two times a day (BID) | ORAL | Status: DC

## 2018-04-12 NOTE — Discharge Instructions (Signed)
Pharyngitis  Pharyngitis is a sore throat (pharynx). This is when there is redness, pain, and swelling in your throat. Most of the time, this condition gets better on its own. In some cases, you may need medicine. Follow these instructions at home:  Take over-the-counter and prescription medicines only as told by your doctor. ? If you were prescribed an antibiotic medicine, take it as told by your doctor. Do not stop taking the antibiotic even if you start to feel better. ? Do not give children aspirin. Aspirin has been linked to Reye syndrome.  Drink enough water and fluids to keep your pee (urine) clear or pale yellow.  Get a lot of rest.  Rinse your mouth (gargle) with a salt-water mixture 3-4 times a day or as needed. To make a salt-water mixture, completely dissolve -1 tsp of salt in 1 cup of warm water.  If your doctor approves, you may use throat lozenges or sprays to soothe your throat. Contact a doctor if:  You have large, tender lumps in your neck.  You have a rash.  You cough up green, yellow-brown, or bloody spit. Get help right away if:  You have a stiff neck.  You drool or cannot swallow liquids.  You cannot drink or take medicines without throwing up.  You have very bad pain that does not go away with medicine.  You have problems breathing, and it is not from a stuffy nose.  You have new pain and swelling in your knees, ankles, wrists, or elbows. Summary  Pharyngitis is a sore throat (pharynx). This is when there is redness, pain, and swelling in your throat.  If you were prescribed an antibiotic medicine, take it as told by your doctor. Do not stop taking the antibiotic even if you start to feel better.  Most of the time, pharyngitis gets better on its own. Sometimes, you may need medicine. This information is not intended to replace advice given to you by your health care provider. Make sure you discuss any questions you have with your health care  provider. Document Released: 08/24/2007 Document Revised: 04/12/2016 Document Reviewed: 04/12/2016 Elsevier Interactive Patient Education  2019 [employer] Inc[employer]  

## 2018-04-12 NOTE — Care Management Note (Signed)
Case Management Note  Patient Details  Name: Benjamin Obrien MRN: [id number] Date of Birth: 16-Mar-1976   Patient to discharge today.  Patients prescriptions for antibiotics and steroids faxed to [facility] .  Patient to pick up at no cost after discharge.  Patient is aware that they will be unable to fill the nicotine patches, and he will have to obtain from the pharmacy of his choice.  Patient denies issues with transportation.  Patient states he does not have a PCP, RNCM provided application to [facility] .   Subjective/Objective:                    Action/Plan:   Expected Discharge Date:  04/12/18               Expected Discharge Plan:  Home/Self Care  In-House Referral:     Discharge planning Services  CM Consult, Indigent Health Clinic, Medication Assistance  Post Acute Care Choice:    Choice offered to:     DME Arranged:    DME Agency:     HH Arranged:    HH Agency:     Status of Service:  Completed, signed off  If discussed at [employer], dates discussed:    Additional Comments:  Chapman Fitch, RN 04/12/2018, 10:12 AM

## 2018-04-12 NOTE — Progress Notes (Signed)
Benjamin Obrien, Benjamin Obrien [id number] [birth year] Benjamin MealyPaul Obrien Benjamin Oplinger, MD   SUBJECTIVE: This 43 y.o. year old male is status post No admission procedures for hospital encounter.. Much better this AM with resolution of throat pain, able to swallow and wanting a normal diet. No longer having any issues breathing when lying down.  Medications:  Current Facility-Administered Medications  Medication Dose Route Frequency Provider Last Rate Last Dose  . acetaminophen (TYLENOL) tablet 650 mg  650 mg Oral Q6H PRN Ihor AustinPyreddy, Pavan, MD       Or  . acetaminophen (TYLENOL) suppository 650 mg  650 mg Rectal Q6H PRN Pyreddy, Pavan, MD      . cefTRIAXone (ROCEPHIN) 1 g in sodium chloride 0.9 % 100 mL IVPB  1 g Intravenous Q24H Pyreddy, Pavan, MD      . enoxaparin (LOVENOX) injection 40 mg  40 mg Subcutaneous Q24H Pyreddy, Pavan, MD      . ketorolac (TORADOL) 30 MG/ML injection 15 mg  15 mg Intravenous Q8H PRN Oralia ManisWillis, David, MD   15 mg at 04/12/18 0544  . magic mouthwash  15 mL Oral QID PRN Ihor AustinPyreddy, Pavan, MD   15 mL at 04/11/18 1748  . nicotine (NICODERM CQ - dosed in mg/24 hours) patch 21 mg  21 mg Transdermal Daily Pyreddy, Vivien RotaPavan, MD   21 mg at 04/11/18 1747  . ondansetron (ZOFRAN) tablet 4 mg  4 mg Oral Q6H PRN Pyreddy, Vivien RotaPavan, MD       Or  . ondansetron (ZOFRAN) injection 4 mg  4 mg Intravenous Q6H PRN Pyreddy, Pavan, MD      . predniSONE (DELTASONE) tablet 50 mg  50 mg Oral Q breakfast Mody, Sital, MD      . senna-docusate (Senokot-Obrien) tablet 1 tablet  1 tablet Oral QHS PRN Pyreddy, Vivien RotaPavan, MD      . traMADol (ULTRAM) tablet 50 mg  50 mg Oral Q6H PRN Ihor AustinPyreddy, Pavan, MD   50 mg at 04/12/18 0717  .  Medications Prior to Admission  Medication Sig Dispense Refill  . cephALEXin (KEFLEX) 500 MG capsule Take 1 capsule (500 mg total) by mouth 3 (three) times daily. (Patient not taking: Reported on 04/11/2018) 21 capsule 0  . traMADol (ULTRAM) 50 MG tablet Take 1 tablet (50 mg total) by mouth every 6 (six) hours as needed. (Patient  not taking: Reported on 04/11/2018) 15 tablet 0    OBJECTIVE:  PHYSICAL EXAM  Vitals: Blood pressure 122/81, pulse 81, temperature 97.7 F (36.5 C), temperature source Oral, resp. rate 18, height 5\' 8"  (1.727 m), weight 71.7 kg, SpO2 100 %.. General: Well-developed, Well-nourished in no acute distress Mood: Mood and affect well adjusted, pleasant and cooperative. Orientation: Grossly alert and oriented. Vocal Quality: No hoarseness. Communicates verbally. head and Face: NCAT. No facial asymmetry. No visible skin lesions. No significant facial scars. No tenderness with sinus percussion. Facial strength normal and symmetric. Neck: Supple and symmetric with no palpable masses, tenderness or crepitance. The trachea is midline. Thyroid gland is soft, nontender and symmetric with no masses or enlargement. Parotid and submandibular glands are soft, nontender and symmetric, without masses. Lymphatic: Cervical lymph nodes are with shotty but nontender lymphadenopathy  Respiratory: Normal respiratory effort without labored breathing.  MEDICAL DECISION MAKING: Data Review:  Results for orders placed or performed during the hospital encounter of 04/11/18 (from the past 48 hour(Obrien))  Group A Strep by PCR ([facility] Only)     Status: Abnormal   Collection Time: 04/11/18  1:23 PM  Result Value Ref  Range   Group A Strep by PCR DETECTED (A) NOT DETECTED    Comment: Performed at [facility], [address] [place], [place] [address]  Comprehensive metabolic panel     Status: Abnormal   Collection Time: 04/11/18  1:23 PM  Result Value Ref Range   Sodium 139 135 - 145 mmol/L   Potassium 4.5 3.5 - 5.1 mmol/L   Chloride 104 98 - 111 mmol/L   CO2 28 22 - 32 mmol/L   Glucose, Bld 105 (H) 70 - 99 mg/dL   BUN 14 6 - 20 mg/dL   Creatinine, Ser 9.140.92 0.61 - 1.24 mg/dL   Calcium 9.2 8.9 - 78.210.3 mg/dL   Total Protein 7.4 6.5 - 8.1 g/dL   Albumin 3.8 3.5 - 5.0 g/dL   AST 13 (L) 15 - 41 U/L   ALT 10 0 - 44  U/L   Alkaline Phosphatase 65 38 - 126 U/L   Total Bilirubin 0.4 0.3 - 1.2 mg/dL   GFR calc non Af Amer >60 >60 mL/min   GFR calc Af Amer >60 >60 mL/min   Anion gap 7 5 - 15    Comment: Performed at [facility], [address] [place], [place] [address]  CBC with Differential     Status: Abnormal   Collection Time: 04/11/18  1:23 PM  Result Value Ref Range   WBC 19.3 (H) 4.0 - 10.5 K/uL   RBC 4.51 4.22 - 5.81 MIL/uL   Hemoglobin 13.9 13.0 - 17.0 g/dL   HCT 30.842.4 65.739.0 - 84.652.0 %   MCV 94.0 80.0 - 100.0 fL   MCH 30.8 26.0 - 34.0 pg   MCHC 32.8 30.0 - 36.0 g/dL   RDW 96.212.3 95.211.5 - 84.115.5 %   Platelets 408 (H) 150 - 400 K/uL   nRBC 0.0 0.0 - 0.2 %   Neutrophils Relative % 79 %   Neutro Abs 15.3 (H) 1.7 - 7.7 K/uL   Lymphocytes Relative 9 %   Lymphs Abs 1.8 0.7 - 4.0 K/uL   Monocytes Relative 9 %   Monocytes Absolute 1.7 (H) 0.1 - 1.0 K/uL   Eosinophils Relative 2 %   Eosinophils Absolute 0.3 0.0 - 0.5 K/uL   Basophils Relative 0 %   Basophils Absolute 0.1 0.0 - 0.1 K/uL   Immature Granulocytes 1 %   Abs Immature Granulocytes 0.13 (H) 0.00 - 0.07 K/uL    Comment: Performed at [facility], [address] [place], [place] [address]  Basic metabolic panel     Status: Abnormal   Collection Time: 04/12/18  3:29 AM  Result Value Ref Range   Sodium 137 135 - 145 mmol/L   Potassium 3.9 3.5 - 5.1 mmol/L   Chloride 106 98 - 111 mmol/L   CO2 23 22 - 32 mmol/L   Glucose, Bld 174 (H) 70 - 99 mg/dL   BUN 11 6 - 20 mg/dL   Creatinine, Ser 1.020.77 0.61 - 1.24 mg/dL   Calcium 8.0 (L) 8.9 - 10.3 mg/dL   GFR calc non Af Amer >60 >60 mL/min   GFR calc Af Amer >60 >60 mL/min   Anion gap 8 5 - 15    Comment: Performed at [facility], [address] [place], [place] [address]  CBC     Status: Abnormal   Collection Time: 04/12/18  3:29 AM  Result Value Ref Range   WBC 22.7 (H) 4.0 - 10.5 K/uL   RBC 4.06 (L) 4.22 -  5.81 MIL/uL   Hemoglobin 12.4 (L) 13.0 - 17.0 g/dL    HCT 16.1 (L) 09.6 - 52.0 %   MCV 93.8 80.0 - 100.0 fL   MCH 30.5 26.0 - 34.0 pg   MCHC 32.5 30.0 - 36.0 g/dL   RDW 04.5 40.9 - 81.1 %   Platelets 422 (H) 150 - 400 K/uL   nRBC 0.0 0.0 - 0.2 %    Comment: Performed at [facility], [address], [place], [place] [address]  . Ct Soft Tissue Neck W Contrast  Result Date: 04/11/2018 CLINICAL DATA:  Sore throat, swelling, and pain with swallowing. Hoarseness. Cough and congestion. Leukocytosis. EXAM: CT NECK WITH CONTRAST TECHNIQUE: Multidetector CT imaging of the neck was performed using the standard protocol following the bolus administration of intravenous contrast. CONTRAST:  75mL OMNIPAQUE IOHEXOL 300 MG/ML  SOLN COMPARISON:  Head and cervical spine CTs 08/23/2017. Maxillofacial CT 12/01/2011. FINDINGS: Pharynx and larynx: There is prominent asymmetric low-density swelling involving the right lateral oropharyngeal soft tissues, greatest inferiorly. Edema extends into the hypopharynx and supraglottic larynx with asymmetric involvement of the right aryepiglottic fold. The epiglottis is mildly thickened diffusely. Edema/inflammation is present in the right greater than left parapharyngeal spaces as well as in the pre epiglottic and right paraglottic fat. Retropharyngeal edema and small volume fluid are present measuring up to 6 mm in AP thickness without significant mass effect or rim enhancement. There is no organized peritonsillar fluid collection. The airway remains patent without significant narrowing. Salivary glands: No inflammation, mass, or stone. Thyroid: Unremarkable. Lymph nodes: Mild bilateral anterior cervical lymphadenopathy, likely reactive with level II a lymph nodes measuring up to 14 mm in short axis on the right and 11 mm on the left. Vascular: Major vascular structures of the neck are patent with mild carotid atherosclerosis noted. Limited intracranial: Unremarkable. Visualized orbits: Unremarkable. Mastoids and visualized  paranasal sinuses: Mild left maxillary sinus mucosal thickening clear mastoid air cells. Skeleton: Dental caries.  Mild cervical spondylosis. Upper chest: Clear lung apices. Thin walled subpleural cyst medially in the left upper lobe. Other: None. IMPRESSION: 1. Findings compatible with pharyngitis and supraglottitis with extensive right-sided edema/phlegmon. No peritonsillar abscess. 2. Mild epiglottic edema.  Patent airway. 3. Small volume retropharyngeal fluid, favor effusion over abscess. Electronically Signed   By: Sebastian Ache M.D.   On: 04/11/2018 15:25  .   ASSESSMENT: pharyngitis with supraglottitis, much improved. I suspect the increased WBC is from demargination of white cells caused by the steroids, as he is substantially better  PLAN: Advance to regular diet. Patient is very anxious to go home. Would at least get today'Obrien dose of Rocephin administered and then could d/c home with follow up with me on Monday, treating with Augmentin 875mg  PO BID x 10 days and a Sterapred DS 6 day taper. Obviously would need to return to ER if symptoms worsen over weekend on PO meds.    Benjamin Mealy, MD 04/12/2018 8:21 AMPatient ID: Benjamin Obrien, male   DOB: 08-03-1975, 43 y.o.   MRN: [id number]

## 2018-04-12 NOTE — Discharge Summary (Signed)
[facility] [place] [facility]   PATIENT NAME: Benjamin Obrien    MR#:  [id number]  DATE OF BIRTH:  1976/02/25  DATE OF ADMISSION:  04/11/2018 ADMITTING PHYSICIAN: Ihor Austin, MD  DATE OF DISCHARGE: 04/12/2018  PRIMARY CARE PHYSICIAN: Patient, No Pcp Per    ADMISSION DIAGNOSIS:  Supraglottic edema [J38.4] Acute streptococcal pharyngitis [J02.0]  DISCHARGE DIAGNOSIS:  Active Problems:   Epiglottiditis   SECONDARY DIAGNOSIS:   Past Medical History:  Diagnosis Date  . Pancreatitis     HOSPITAL COURSE:  43 year old male with tobacco dependence who presented to the emergency room due to inability to swallow and throat pain.  1.  Acute group A pharyngitis with supraglottitis: Patient was evaluated by ENT and underwent diagnostic fiberoptic nasolaryngoscopy.  Recommendations are for Augmentin for 10 days and steroid taper.  Patient will follow-up on Monday with ENT.  Patient has tolerated his diet well and has had no fevers overnight.  2. Tobacco dependence: Patient is encouraged to quit smoking. Counseling was provided for 4 minutes.   3.  Elevated white blood cell count due to demargination caused by steroids.   DISCHARGE CONDITIONS AND DIET:   Stable for discharge on regular diet  CONSULTS OBTAINED:  Treatment Team:  Geanie Logan, MD  DRUG ALLERGIES:  No Known Allergies  DISCHARGE MEDICATIONS:   Allergies as of 04/12/2018   No Known Allergies     Medication List    STOP taking these medications   cephALEXin 500 MG capsule Commonly known as:  KEFLEX   traMADol 50 MG tablet Commonly known as:  ULTRAM     TAKE these medications   amoxicillin-clavulanate 875-125 MG tablet Commonly known as:  AUGMENTIN Take 1 tablet by mouth 2 (two) times daily for 10 days.   nicotine 21 mg/24hr patch Commonly known as:  NICODERM CQ - dosed in mg/24 hours Place 1 patch (21 mg total) onto the skin daily.   predniSONE 10 MG tablet Commonly known  as:  DELTASONE Take 1 tablet (10 mg total) by mouth daily with breakfast. 50 mg PO (ORAL)  x 2 days 40 mg PO (ORAL)  x 2 days 30 mg PO  (ORAL)  x 2 days 20 mg PO  (ORAL) x 2 days 10 mg PO  (ORAL) x 2 days then stop         Today   CHIEF COMPLAINT:   Patient doing well and would like to go home today.  Tolerated his diet well this morning.   VITAL SIGNS:  Blood pressure 122/81, pulse 81, temperature 97.7 F (36.5 C), temperature source Oral, resp. rate 18, height 5\' 8"  (1.727 m), weight 71.7 kg, SpO2 100 %.   REVIEW OF SYSTEMS:  Review of Systems  Constitutional: Negative.  Negative for chills, fever and malaise/fatigue.  HENT: Negative.  Negative for ear discharge, ear pain, hearing loss, nosebleeds and sore throat.   Eyes: Negative.  Negative for blurred vision and pain.  Respiratory: Negative.  Negative for cough, hemoptysis, shortness of breath and wheezing.   Cardiovascular: Negative.  Negative for chest pain, palpitations and leg swelling.  Gastrointestinal: Negative.  Negative for abdominal pain, blood in stool, diarrhea, nausea and vomiting.  Genitourinary: Negative.  Negative for dysuria.  Musculoskeletal: Negative.  Negative for back pain.  Skin: Negative.   Neurological: Negative for dizziness, tremors, speech change, focal weakness, seizures and headaches.  Endo/Heme/Allergies: Negative.  Does not bruise/bleed easily.  Psychiatric/Behavioral: Negative.  Negative for depression, hallucinations and suicidal ideas.  PHYSICAL EXAMINATION:  GENERAL:  43 y.o.-year-old patient lying in the bed with no acute distress.  NECK:  Supple, no jugular venous distention. No thyroid enlargement, no tenderness.  LUNGS: Normal breath sounds bilaterally, no wheezing, rales,rhonchi  No use of accessory muscles of respiration.  CARDIOVASCULAR: S1, S2 normal. No murmurs, rubs, or gallops.  ABDOMEN: Soft, non-tender, non-distended. Bowel sounds present. No organomegaly or mass.   EXTREMITIES: No pedal edema, cyanosis, or clubbing.  PSYCHIATRIC: The patient is alert and oriented x 3.  SKIN: No obvious rash, lesion, or ulcer.   DATA REVIEW:   CBC Recent Labs  Lab 04/12/18 0329  WBC 22.7*  HGB 12.4*  HCT 38.1*  PLT 422*    Chemistries  Recent Labs  Lab 04/11/18 1323 04/12/18 0329  NA 139 137  K 4.5 3.9  CL 104 106  CO2 28 23  GLUCOSE 105* 174*  BUN 14 11  CREATININE 0.92 0.77  CALCIUM 9.2 8.0*  AST 13*  --   ALT 10  --   ALKPHOS 65  --   BILITOT 0.4  --     Cardiac Enzymes No results for input(s): TROPONINI in the last 168 hours.  Microbiology Results  @[email or web address] @  RADIOLOGY:  Ct Soft Tissue Neck W Contrast  Result Date: 04/11/2018 CLINICAL DATA:  Sore throat, swelling, and pain with swallowing. Hoarseness. Cough and congestion. Leukocytosis. EXAM: CT NECK WITH CONTRAST TECHNIQUE: Multidetector CT imaging of the neck was performed using the standard protocol following the bolus administration of intravenous contrast. CONTRAST:  75mL OMNIPAQUE IOHEXOL 300 MG/ML  SOLN COMPARISON:  Head and cervical spine CTs 08/23/2017. Maxillofacial CT 12/01/2011. FINDINGS: Pharynx and larynx: There is prominent asymmetric low-density swelling involving the right lateral oropharyngeal soft tissues, greatest inferiorly. Edema extends into the hypopharynx and supraglottic larynx with asymmetric involvement of the right aryepiglottic fold. The epiglottis is mildly thickened diffusely. Edema/inflammation is present in the right greater than left parapharyngeal spaces as well as in the pre epiglottic and right paraglottic fat. Retropharyngeal edema and small volume fluid are present measuring up to 6 mm in AP thickness without significant mass effect or rim enhancement. There is no organized peritonsillar fluid collection. The airway remains patent without significant narrowing. Salivary glands: No inflammation, mass, or stone. Thyroid: Unremarkable. Lymph nodes: Mild  bilateral anterior cervical lymphadenopathy, likely reactive with level II a lymph nodes measuring up to 14 mm in short axis on the right and 11 mm on the left. Vascular: Major vascular structures of the neck are patent with mild carotid atherosclerosis noted. Limited intracranial: Unremarkable. Visualized orbits: Unremarkable. Mastoids and visualized paranasal sinuses: Mild left maxillary sinus mucosal thickening clear mastoid air cells. Skeleton: Dental caries.  Mild cervical spondylosis. Upper chest: Clear lung apices. Thin walled subpleural cyst medially in the left upper lobe. Other: None. IMPRESSION: 1. Findings compatible with pharyngitis and supraglottitis with extensive right-sided edema/phlegmon. No peritonsillar abscess. 2. Mild epiglottic edema.  Patent airway. 3. Small volume retropharyngeal fluid, favor effusion over abscess. Electronically Signed   By: Sebastian AcheAllen  Grady M.D.   On: 04/11/2018 15:25      Allergies as of 04/12/2018   No Known Allergies     Medication List    STOP taking these medications   cephALEXin 500 MG capsule Commonly known as:  KEFLEX   traMADol 50 MG tablet Commonly known as:  ULTRAM     TAKE these medications   amoxicillin-clavulanate 875-125 MG tablet Commonly known as:  AUGMENTIN Take 1 tablet by mouth  2 (two) times daily for 10 days.   nicotine 21 mg/24hr patch Commonly known as:  NICODERM CQ - dosed in mg/24 hours Place 1 patch (21 mg total) onto the skin daily.   predniSONE 10 MG tablet Commonly known as:  DELTASONE Take 1 tablet (10 mg total) by mouth daily with breakfast. 50 mg PO (ORAL)  x 2 days 40 mg PO (ORAL)  x 2 days 30 mg PO  (ORAL)  x 2 days 20 mg PO  (ORAL) x 2 days 10 mg PO  (ORAL) x 2 days then stop          Management plans discussed with the patient and he is in agreement. Stable for discharge home  Patient should follow up with ENT  CODE STATUS:     Code Status Orders  (From admission, onward)         Start      Ordered   04/11/18 1650  Full code  Continuous     04/11/18 1649        Code Status History    Date Active Date Inactive Code Status Order ID Comments User Context   08/24/2017 0212 08/25/2017 2010 Full Code [id number]  Cammy Copa, MD Inpatient    Advance Directive Documentation     Most Recent Value  Type of Advance Directive  Healthcare Power of Attorney  Pre-existing out of facility DNR order (yellow form or pink MOST form)  -  "MOST" Form in Place?  -      TOTAL TIME TAKING CARE OF THIS PATIENT: 38 minutes.    Note: This dictation was prepared with Dragon dictation along with smaller phrase technology. Any transcriptional errors that result from this process are unintentional.  Auri Jahnke M.D on 04/12/2018 at 10:11 AM  Between 7am to 6pm - Pager - [phone] After 6pm go to [email or web address] - password [email or web address]  [facility]  Office  [phone]  CC: Primary care physician; Patient, No Pcp Per

## 2019-05-11 ENCOUNTER — Emergency Department
Admission: EM | Admit: 2019-05-11 | Discharge: 2019-05-11 | Disposition: A | Discharge status: Home / Self Care | Payer: Self-pay | Attending: Emergency Medicine | Admitting: Emergency Medicine

## 2019-05-11 ENCOUNTER — Other Ambulatory Visit: Payer: Self-pay

## 2019-05-11 ENCOUNTER — Emergency Department: Payer: Self-pay

## 2019-05-11 DIAGNOSIS — M48061 Spinal stenosis, lumbar region without neurogenic claudication: Secondary | ICD-10-CM | POA: Insufficient documentation

## 2019-05-11 DIAGNOSIS — F17210 Nicotine dependence, cigarettes, uncomplicated: Secondary | ICD-10-CM | POA: Insufficient documentation

## 2019-05-11 DIAGNOSIS — Z9889 Other specified postprocedural states: Secondary | ICD-10-CM | POA: Insufficient documentation

## 2019-05-11 DIAGNOSIS — M79604 Pain in right leg: Secondary | ICD-10-CM

## 2019-05-11 LAB — CBC
HCT: 43.4 % (ref 39.0–52.0)
Hemoglobin: 14.7 g/dL (ref 13.0–17.0)
MCH: 31.6 pg (ref 26.0–34.0)
MCHC: 33.9 g/dL (ref 30.0–36.0)
MCV: 93.3 fL (ref 80.0–100.0)
Platelets: 282 10*3/uL (ref 150–400)
RBC: 4.65 MIL/uL (ref 4.22–5.81)
RDW: 12.3 % (ref 11.5–15.5)
WBC: 19.6 10*3/uL — ABNORMAL HIGH (ref 4.0–10.5)
nRBC: 0 % (ref 0.0–0.2)

## 2019-05-11 LAB — COMPREHENSIVE METABOLIC PANEL
ALT: 67 U/L — ABNORMAL HIGH (ref 0–44)
AST: 161 U/L — ABNORMAL HIGH (ref 15–41)
Albumin: 3.7 g/dL (ref 3.5–5.0)
Alkaline Phosphatase: 42 U/L (ref 38–126)
Anion gap: 11 (ref 5–15)
BUN: 29 mg/dL — ABNORMAL HIGH (ref 6–20)
CO2: 24 mmol/L (ref 22–32)
Calcium: 8.7 mg/dL — ABNORMAL LOW (ref 8.9–10.3)
Chloride: 100 mmol/L (ref 98–111)
Creatinine, Ser: 1.28 mg/dL — ABNORMAL HIGH (ref 0.61–1.24)
GFR calc Af Amer: >60 mL/min (ref >60)
GFR calc non Af Amer: >60 mL/min (ref >60)
Glucose, Bld: 133 mg/dL — ABNORMAL HIGH (ref 70–99)
Potassium: 4.5 mmol/L (ref 3.5–5.1)
Sodium: 135 mmol/L (ref 135–145)
Total Bilirubin: 0.7 mg/dL (ref 0.3–1.2)
Total Protein: 6.4 g/dL — ABNORMAL LOW (ref 6.5–8.1)

## 2019-05-11 LAB — PROTIME-INR
INR: 0.9 (ref 0.8–1.2)
Prothrombin Time: 12.2 seconds (ref 11.4–15.2)

## 2019-05-11 IMAGING — CR DG LUMBAR SPINE 2-3V
2 series · 2 of 2 positions shown · non-contrast
Comparison: CT [DATE]

CLINICAL DATA: right leg cold, numb. right hip pain

EXAM:
LUMBAR SPINE - 2-3 VIEW

[l-spine ap]
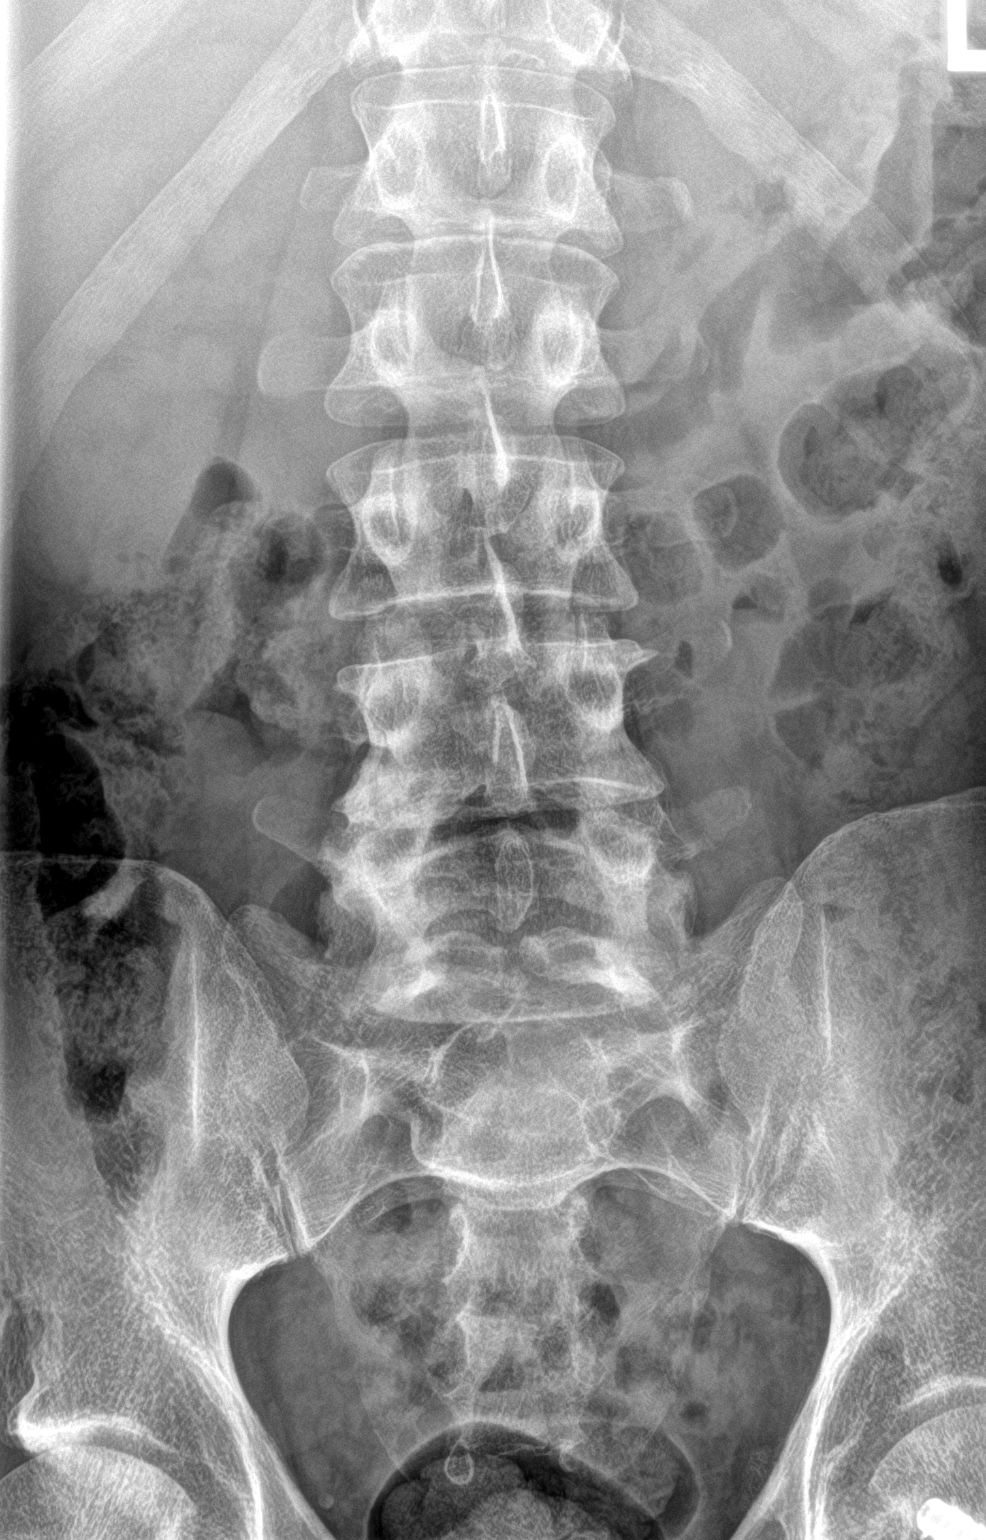

[l-spine lat]
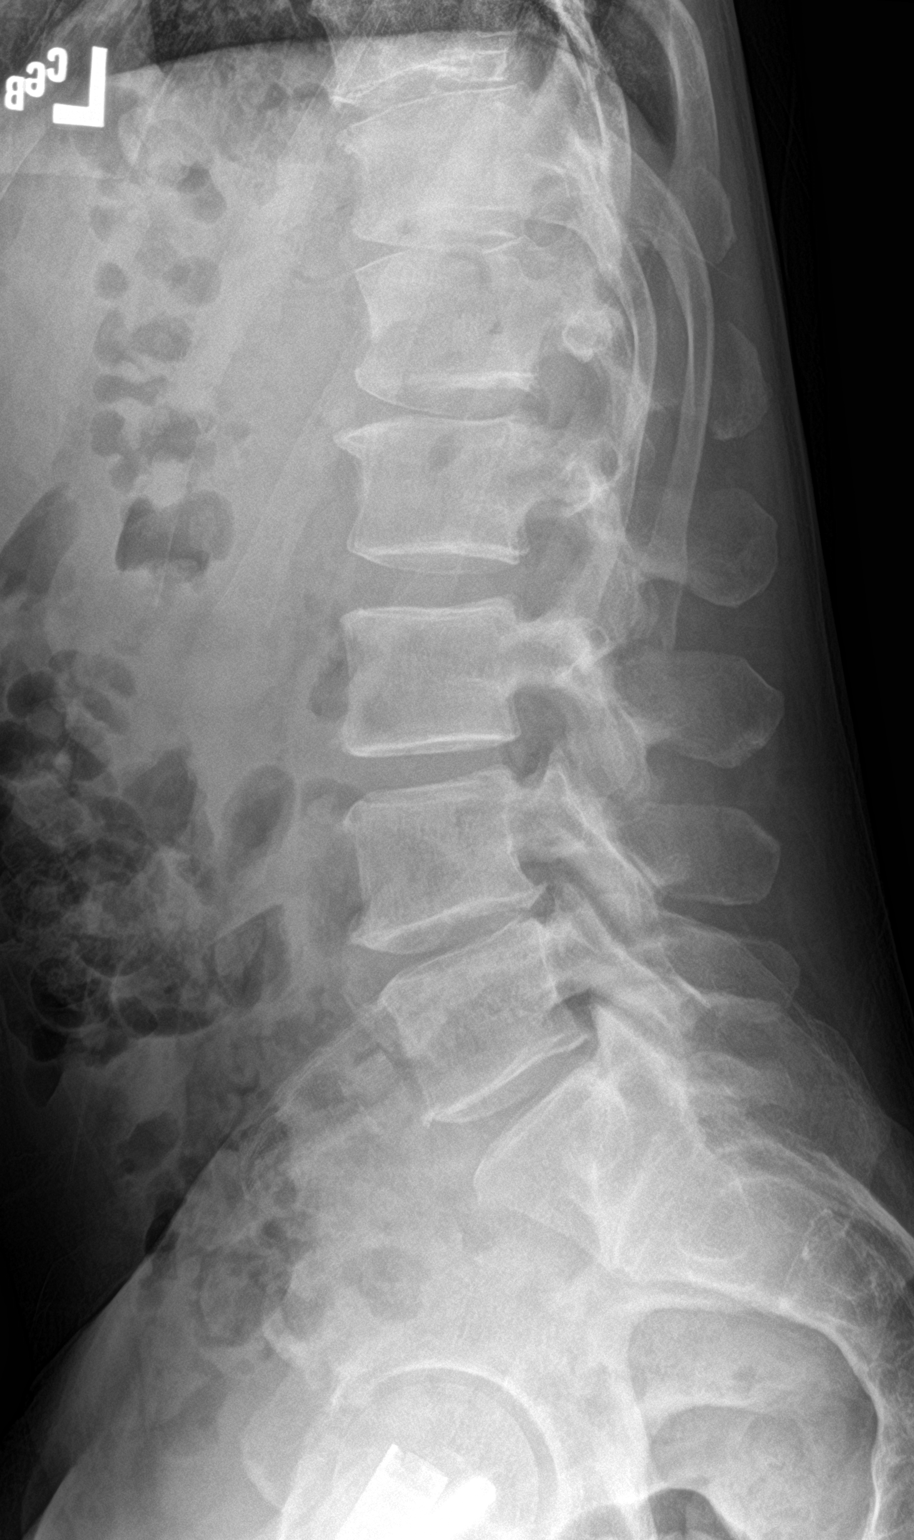

[2 of 2 positions shown; findings below may reference images not displayed]

FINDINGS: Normal alignment. Negative for fracture. Mild narrowing of the L3-4
and L4-5 interspaces. Left femoral neck fixation hardware partially
visualized.
IMPRESSION: Negative for fracture or other acute bone abnormality.

## 2019-05-11 IMAGING — CR DG HIP (WITH OR WITHOUT PELVIS) 2-3V*R*
2 series · 2 of 2 positions shown · non-contrast
Comparison: [DATE]

CLINICAL DATA: Pt states he woke up this AM with R hip pain and R
lower leg numbness.

EXAM:
DG HIP (WITH OR WITHOUT PELVIS) 2-3V RIGHT

[pelvis ap]
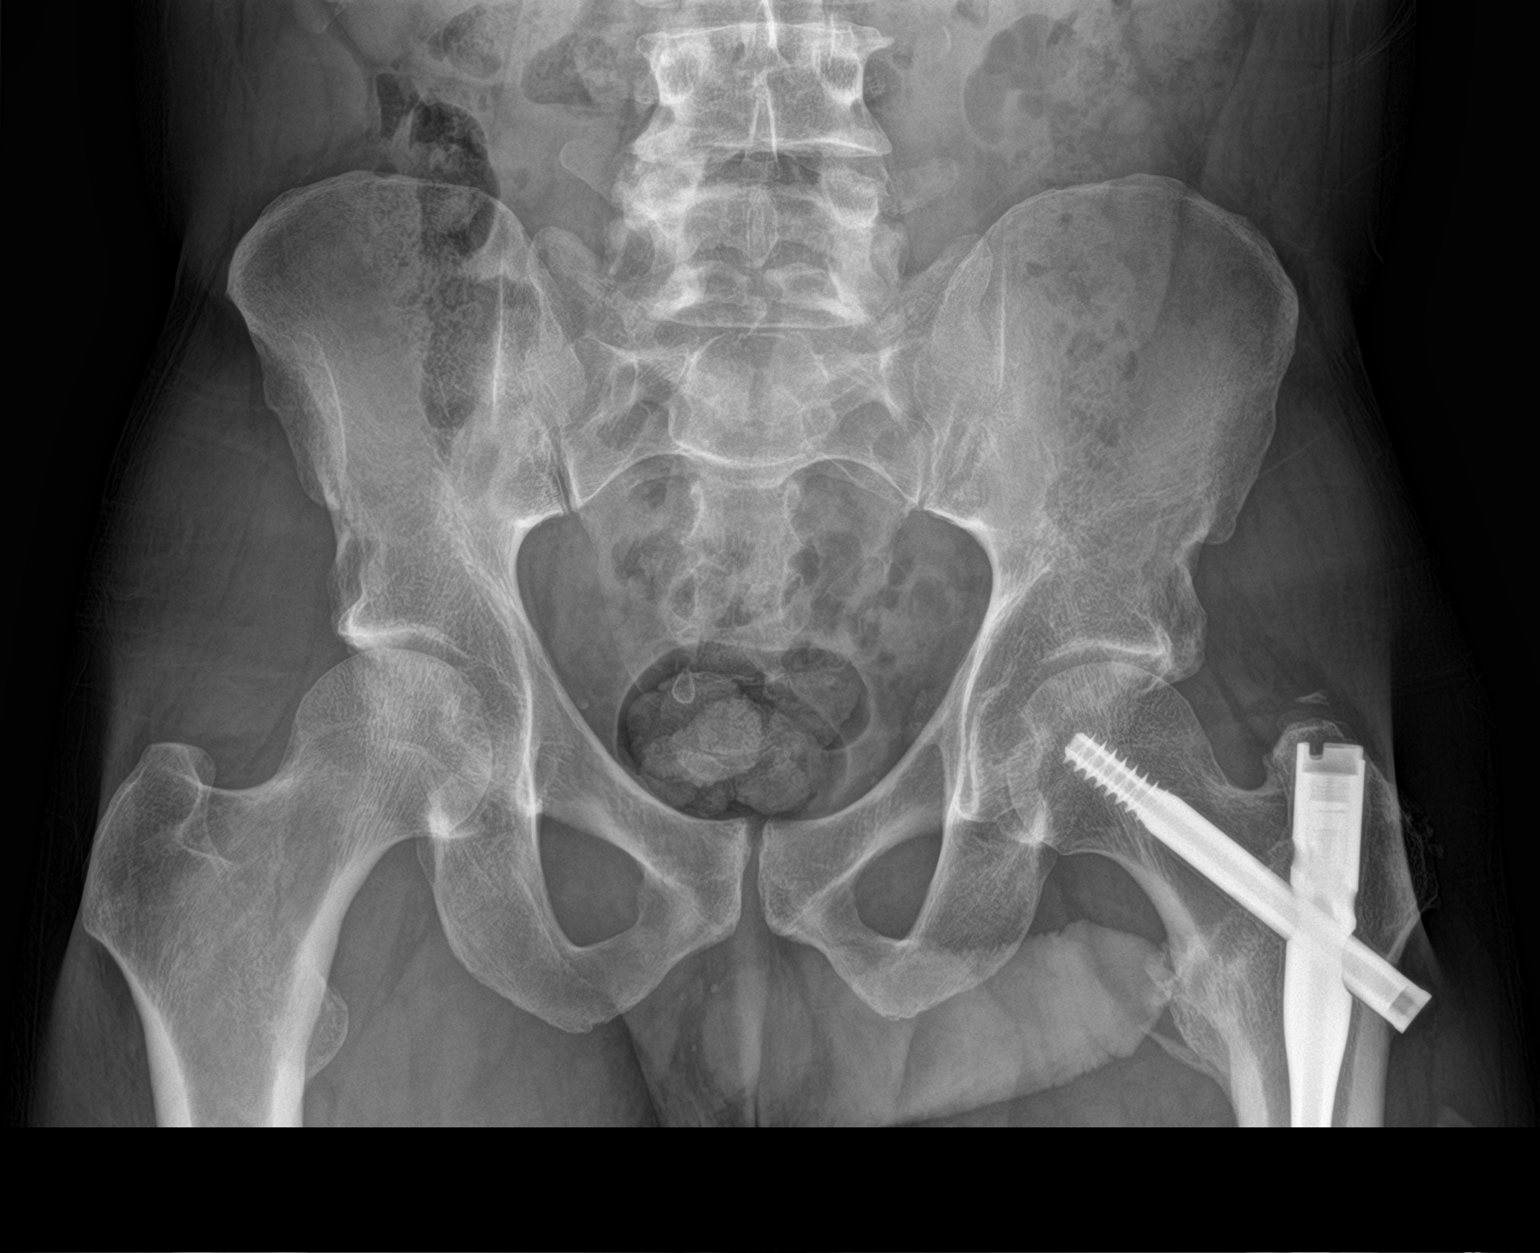

[hip lat]
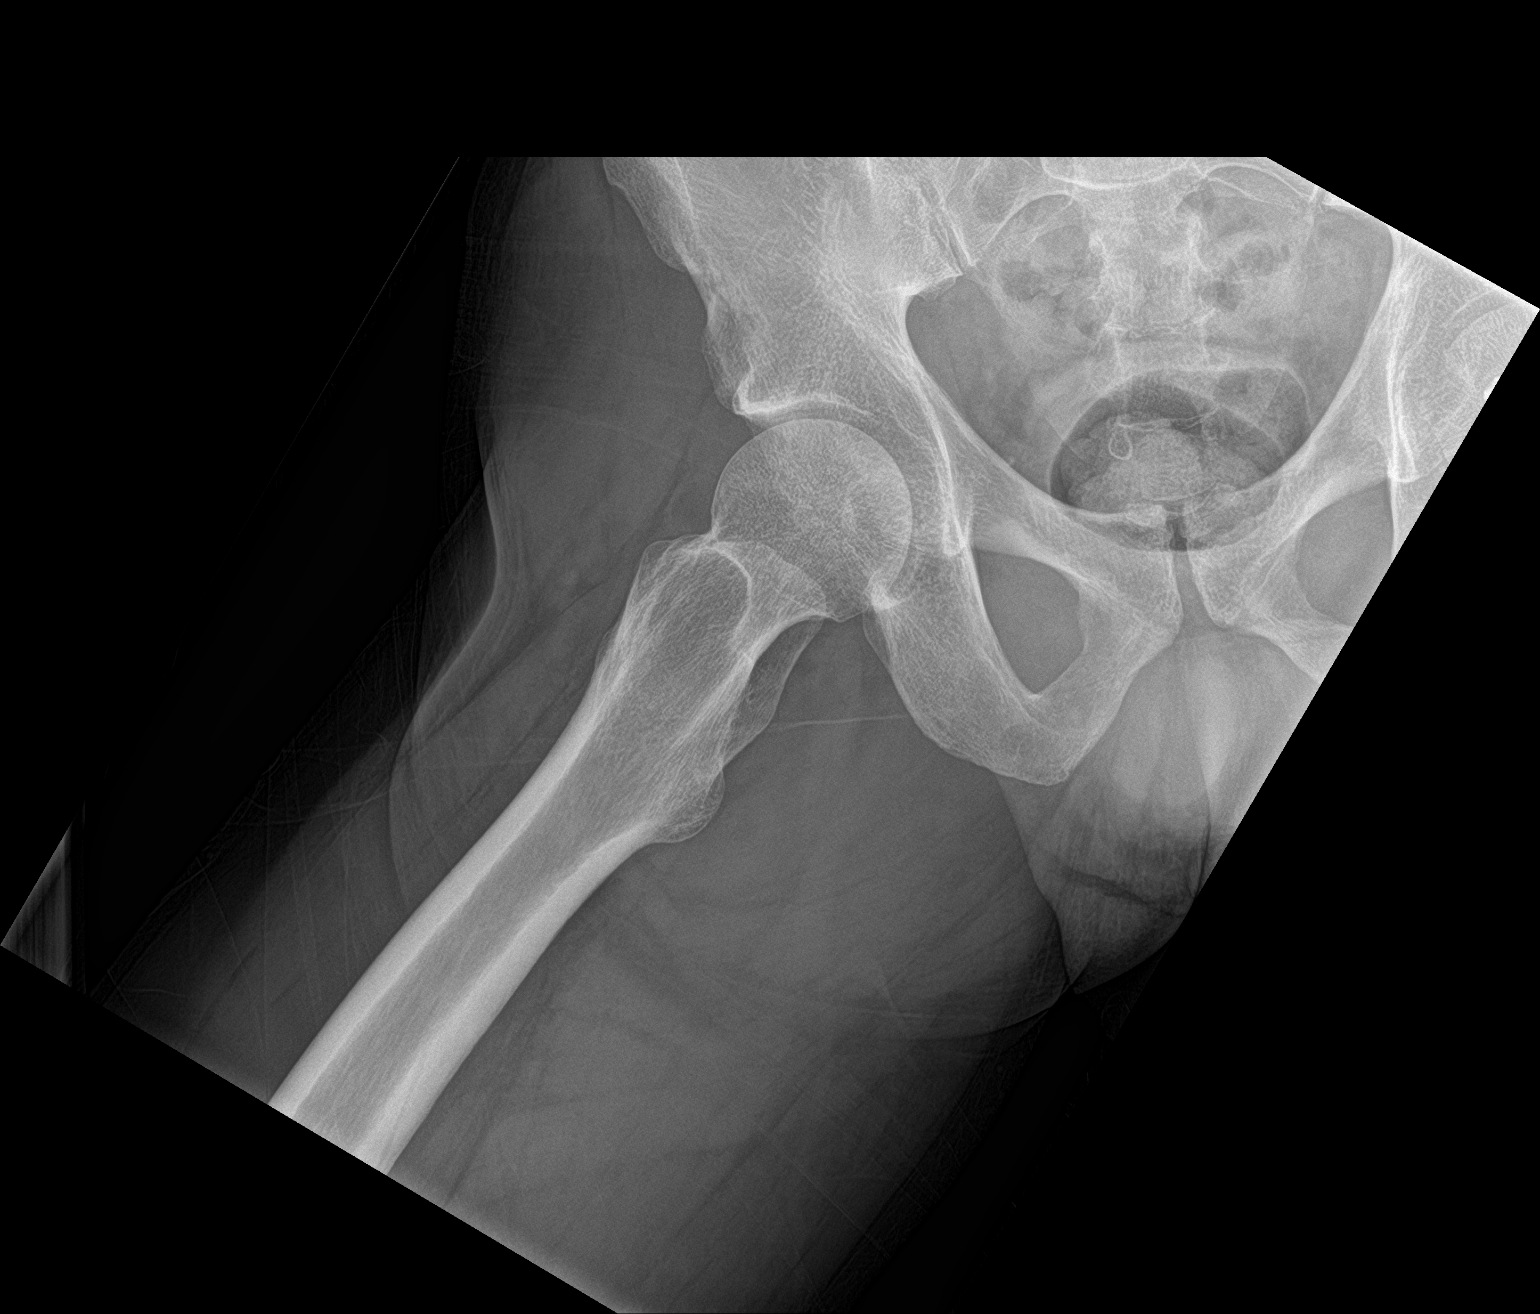

[2 of 2 positions shown; findings below may reference images not displayed]

FINDINGS: Left femoral sliding screw and IM rod, incompletely visualized.
Right hip unremarkable. No fracture or dislocation. No significant
osseous degenerative change.
IMPRESSION: Negative right hip.

## 2019-05-11 IMAGING — MR MR LUMBAR SPINE WO/W CM
7 of 8 series · 38 of 48 positions shown · IV contrast (gadavist)
Comparison: None.

CLINICAL DATA: Left leg weakness

EXAM:
MRI LUMBAR SPINE WITHOUT AND WITH CONTRAST
TECHNIQUE: Multiplanar and multiecho pulse sequences of the lumbar spine were
obtained without and with intravenous contrast.
CONTRAST:  7mL GADAVIST GADOBUTROL 1 MMOL/ML IV SOLN

[Series 5: T2 · sagittal · 4.0mm · 1.02mm/px · 4 of 15 slices shown (1 of 3)]
[im 1/15]
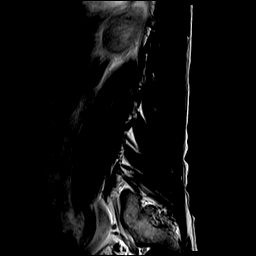
[im 5/15]
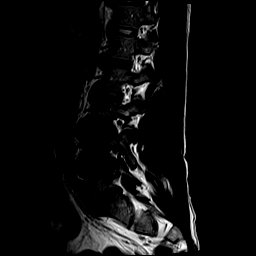
[im 10/15]
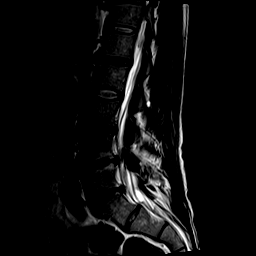
[im 15/15]
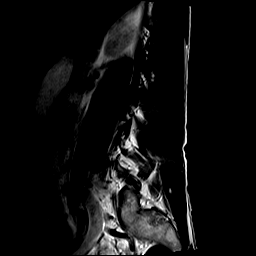

[Series 6: T1 · sagittal · 4.0mm · 1.02mm/px · 4 of 15 slices shown (1 of 2)]
[im 1/15]
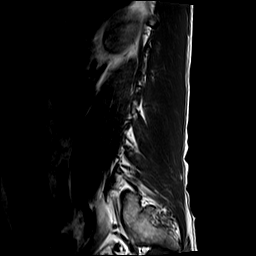
[im 5/15]
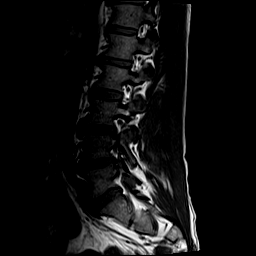
[im 10/15]
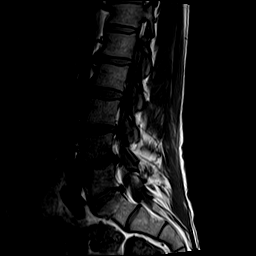
[im 15/15]
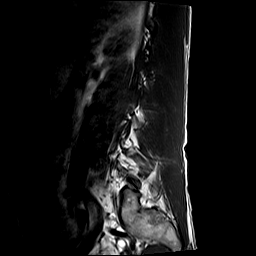

[Series 7: STIR · sagittal · 4.0mm · 0.51mm/px · 2 of 15 slices shown]
[im 1/15]
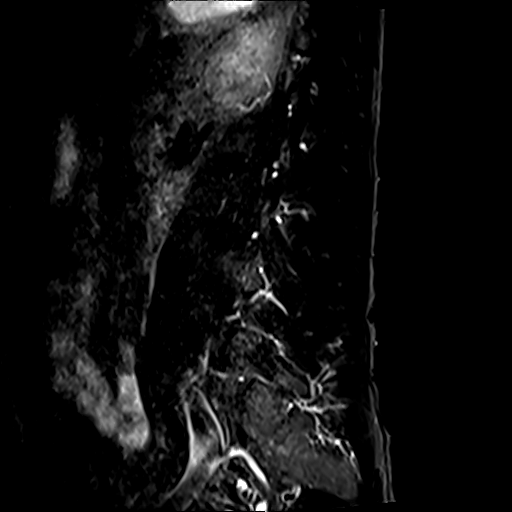
[im 5/15]
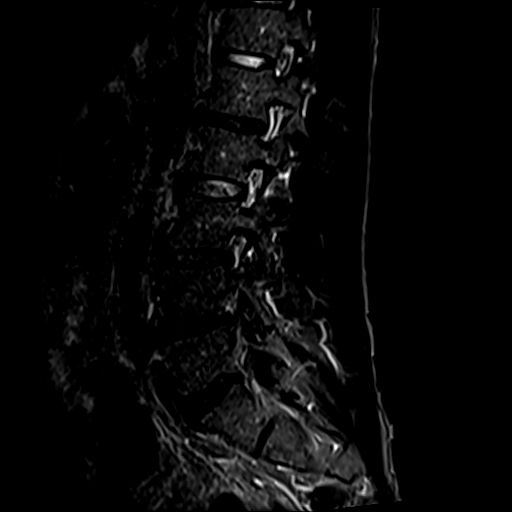

[Series 8: T2 · axial · 4.0mm · 0.78mm/px · z∈[-121,+94]mm · 8 of 36 slices shown (2 of 3)]
[im 1/36]
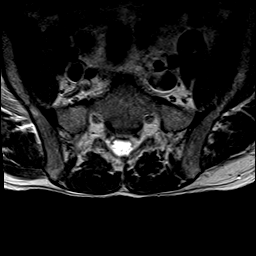
[im 6/36]
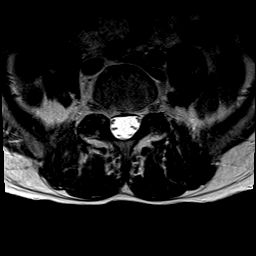
[im 11/36]
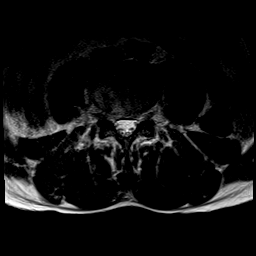
[im 16/36]
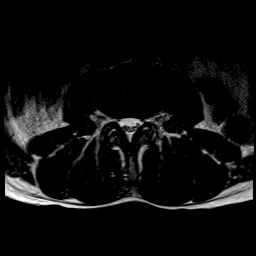
[im 21/36]
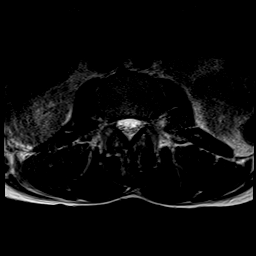
[im 26/36]
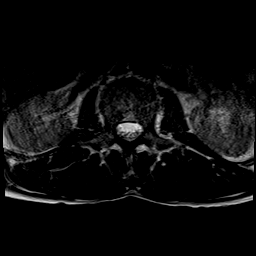
[im 31/36]
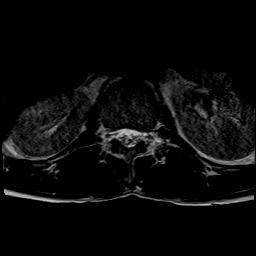
[im 36/36]
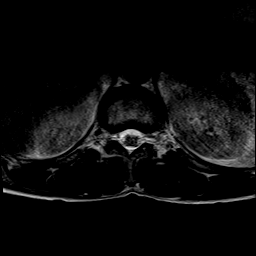

[Series 9: T1 · axial · 4.0mm · 0.39mm/px · z∈[-121,+94]mm · 8 of 36 slices shown (2 of 2)]
[im 1/36]
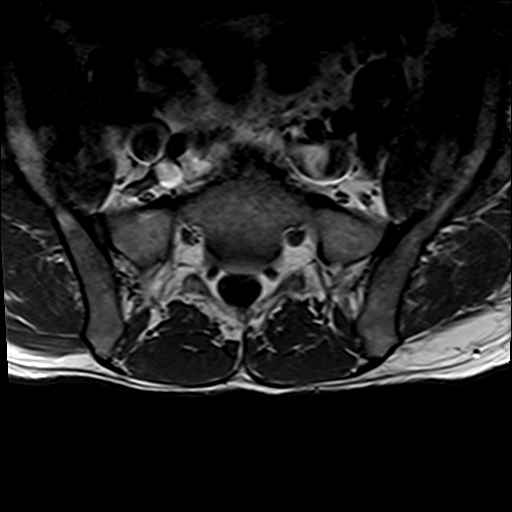
[im 6/36]
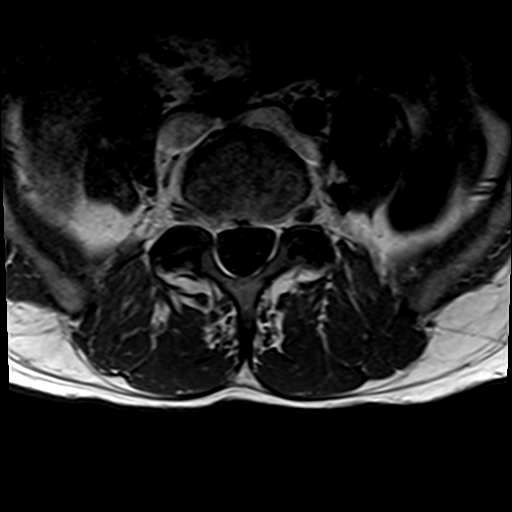
[im 11/36]
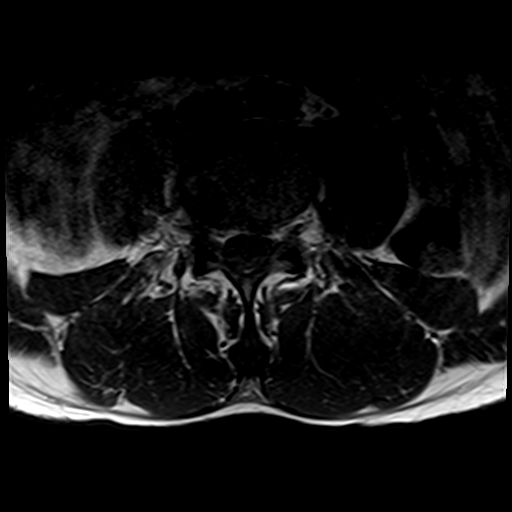
[im 16/36]
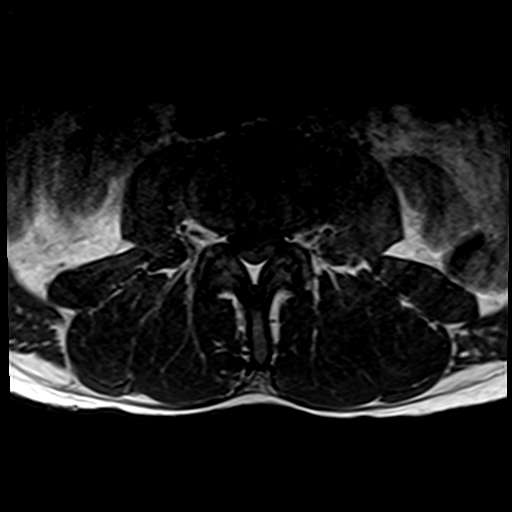
[im 21/36]
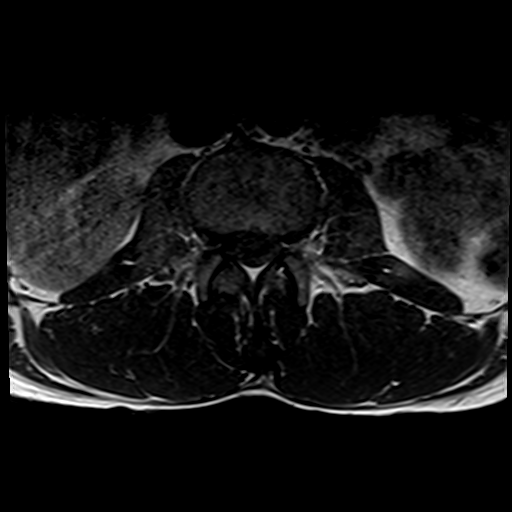
[im 26/36]
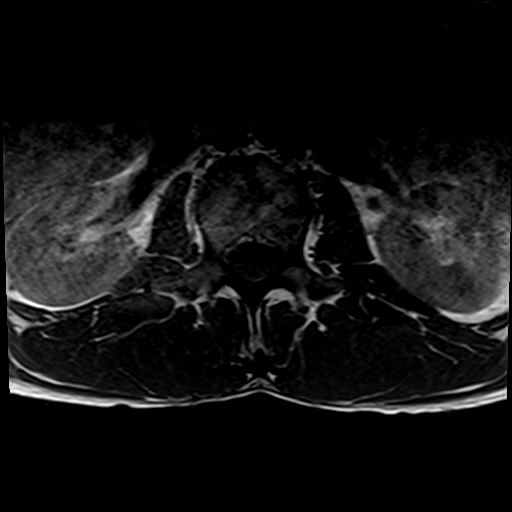
[im 31/36]
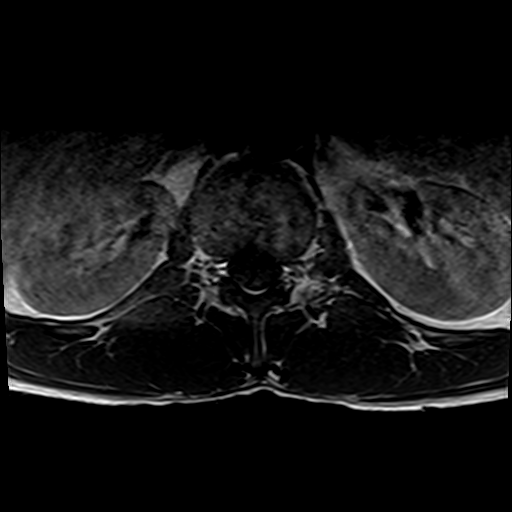
[im 36/36]
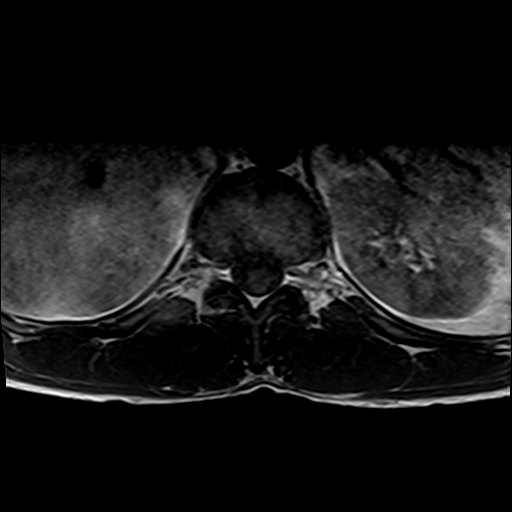

[Series 10: T2 · axial · 4.0mm · 0.78mm/px · z∈[-121,+94]mm · 8 of 36 slices shown (3 of 3)]
[im 1/36]
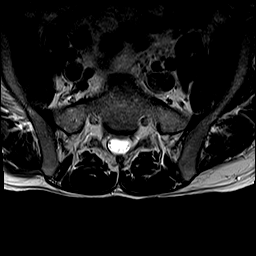
[im 6/36]
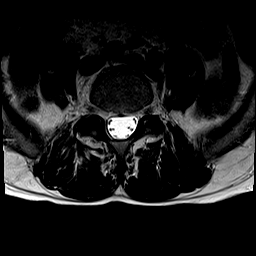
[im 11/36]
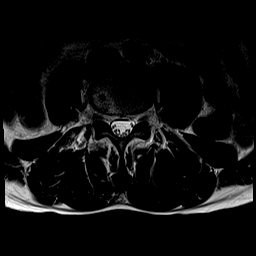
[im 16/36]
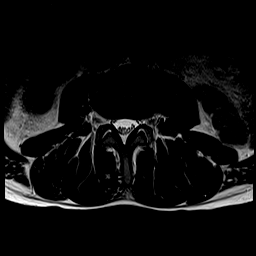
[im 21/36]
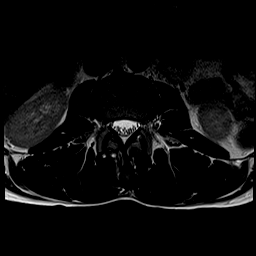
[im 26/36]
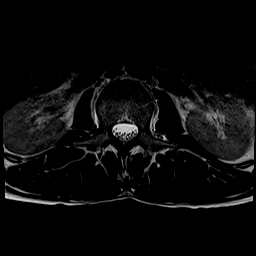
[im 31/36]
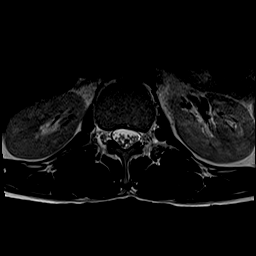
[im 36/36]
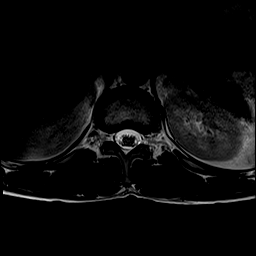

[Series 11: T1 fat-sat post-contrast · sagittal · 4.0mm · 1.02mm/px · 4 of 15 slices shown]
[im 1/15]
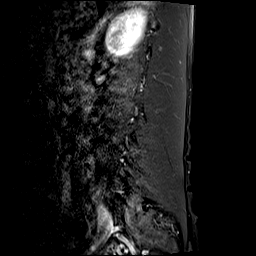
[im 5/15]
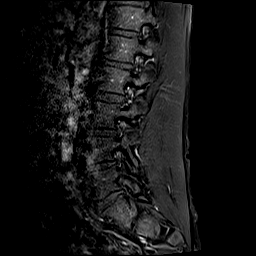
[im 10/15]
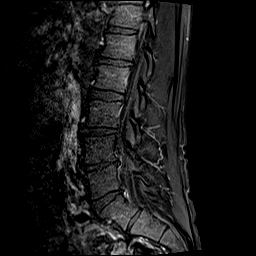
[im 15/15]
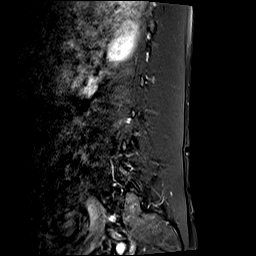

[38 of 48 positions shown; findings below may reference images not displayed]

FINDINGS: Segmentation:  Standard.

Alignment:  Physiologic.

Vertebrae:  No fracture, evidence of discitis, or bone lesion.

Conus medullaris and cauda equina: Conus extends to the L1 level.
Conus and cauda equina appear normal.

Paraspinal and other soft tissues: Negative.

Disc levels:

T12-L1: Normal disc space and facet joints. There is no spinal canal
stenosis. No neural foraminal stenosis.

L1-L2: Small left asymmetric disc bulge. There is no spinal canal
stenosis. No neural foraminal stenosis.

L2-L3: Normal disc space and facet joints. There is no spinal canal
stenosis. No neural foraminal stenosis.

L3-L4: Normal disc space and facet joints. There is no spinal canal
stenosis. No neural foraminal stenosis.

L4-L5: Disc space narrowing with right asymmetric bulge. Endplate
spurring. There is no spinal canal stenosis. Severe right and mild
left neural foraminal stenosis.

L5-S1: Moderate facet hypertrophy and small disc bulge. There is no
spinal canal stenosis. No neural foraminal stenosis.

Visualized sacrum: Normal.

No abnormal contrast enhancement.
IMPRESSION: 1. No acute abnormality of the lumbar spine.
2. Severe right L4-5 neural foraminal stenosis.
3. Moderate L5-S1 facet arthrosis.

## 2019-05-11 IMAGING — CT CT ANGIO AOBIFEM WO/W CM
2 of 10 series · 12 of 46 positions shown, 17 images · IV contrast (APPLIED)
Comparison: [DATE]

CLINICAL DATA: Cold right leg, faint pulse, numbness

EXAM:
CT ANGIOGRAPHY OF ABDOMINAL AORTA WITH ILIOFEMORAL RUNOFF
TECHNIQUE: Multidetector CT imaging of the abdomen, pelvis and lower
extremities was performed using the standard protocol during bolus
administration of intravenous contrast. Multiplanar CT image
reconstructions and MIPs were obtained to evaluate the vascular
anatomy.
CONTRAST:  125mL OMNIPAQUE IOHEXOL 350 MG/ML SOLN

[Series 7: coronal upper · coronal · 0.68mm/px · 1 of 112 slices shown, 2 images]
[im 56/112  soft-tissue]
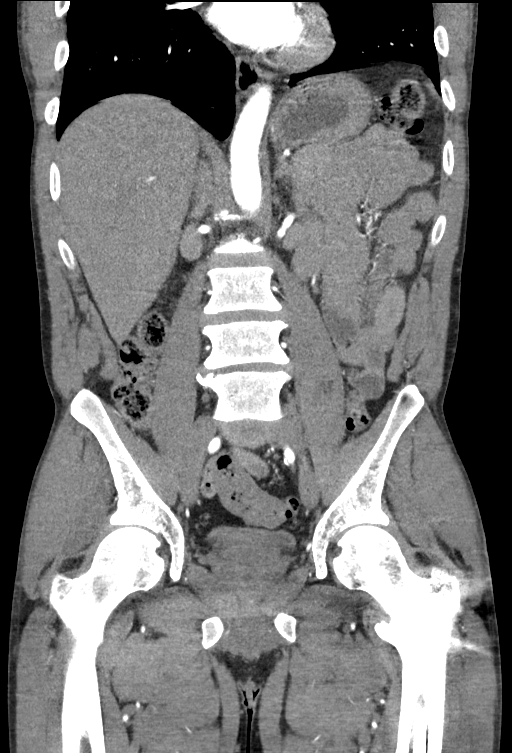
[im 56/112  bone]
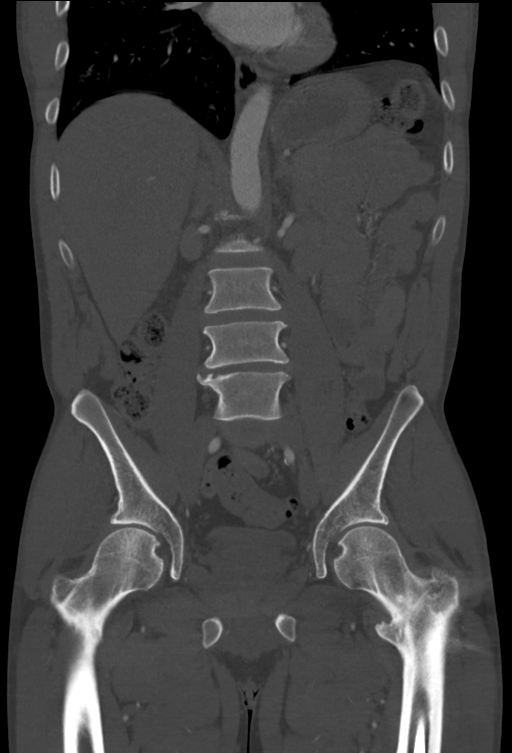

[Series 11: axial arterial lower · axial · arterial · 0.98mm/px · z∈[-1086,-216]mm · 11 of 343 slices shown, 15 images]
[im 35/343  soft-tissue]
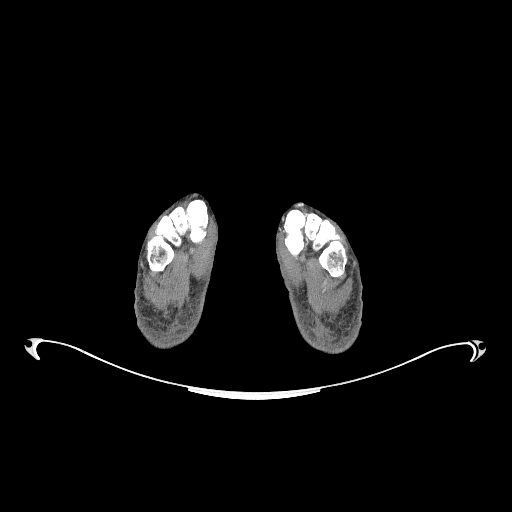
[im 35/343  bone]
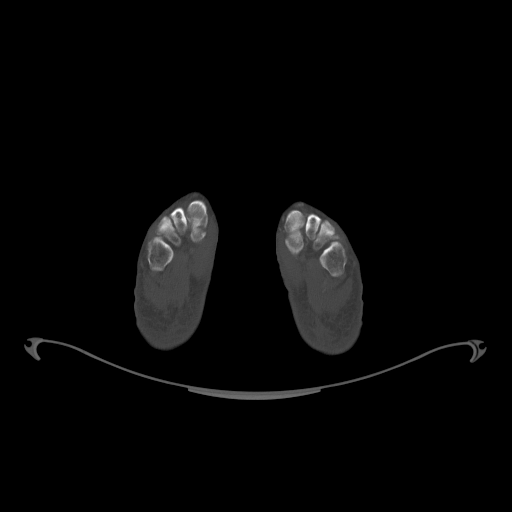
[im 69/343  soft-tissue]
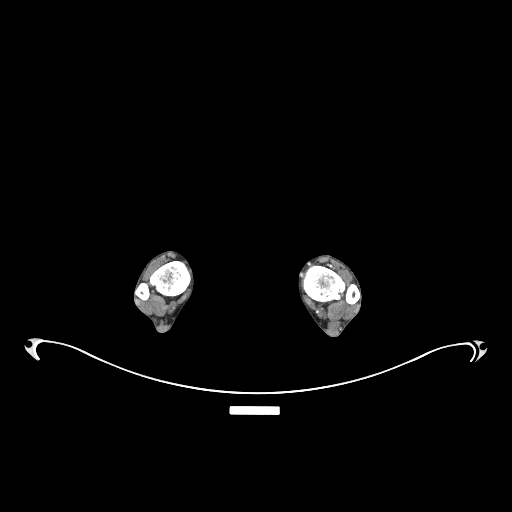
[im 103/343  soft-tissue]
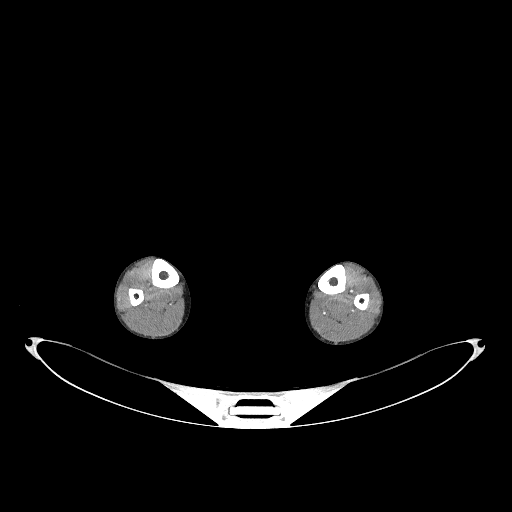
[im 137/343  soft-tissue]
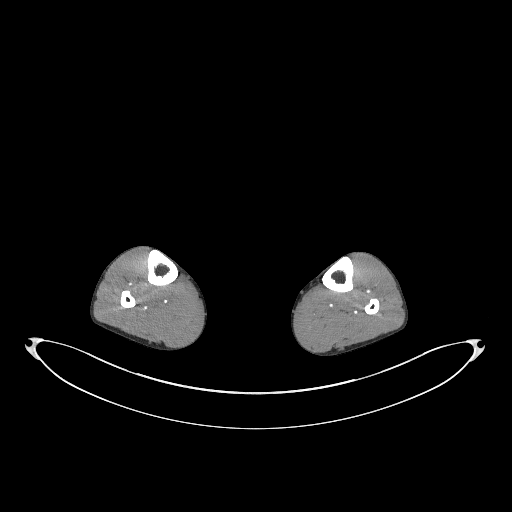
[im 172/343  soft-tissue]
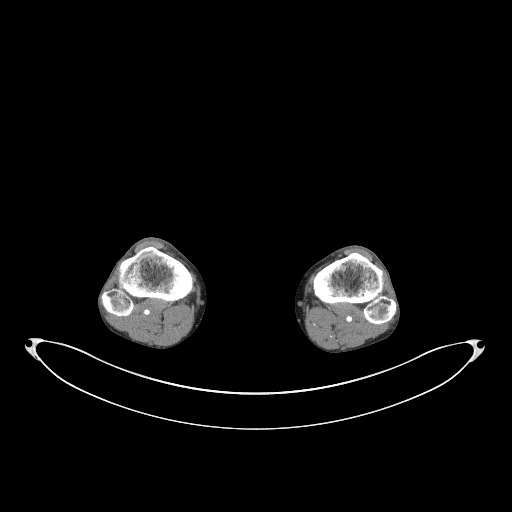
[im 206/343  soft-tissue]
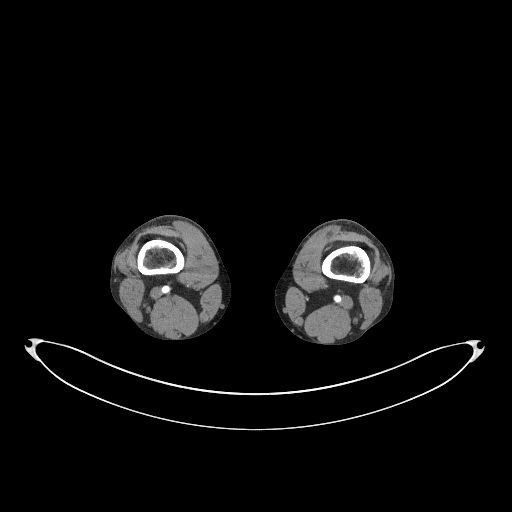
[im 240/343  soft-tissue]
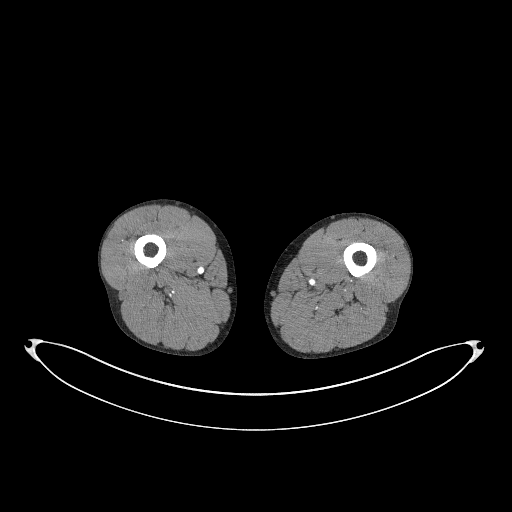
[im 274/343  soft-tissue]
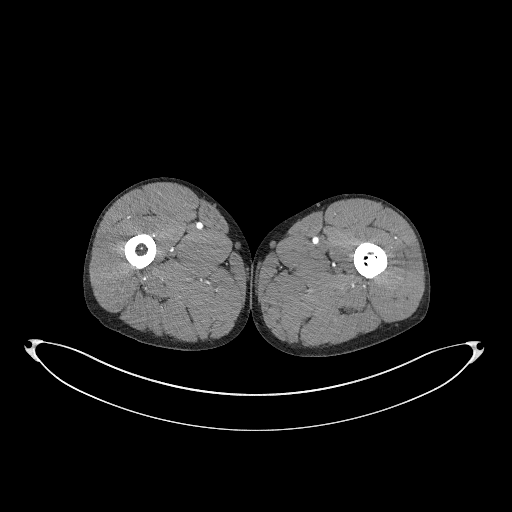
[im 274/343  lung]
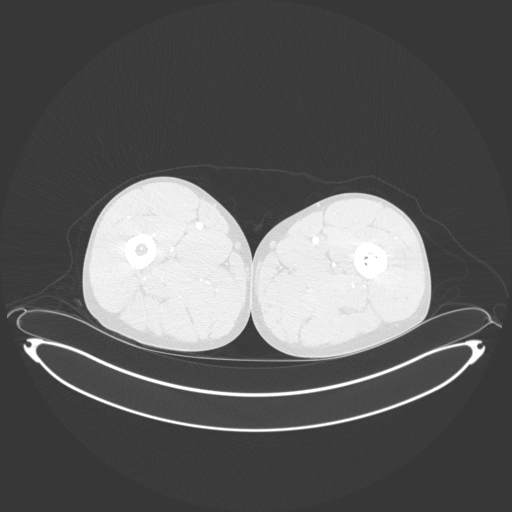
[im 291/343  lung]
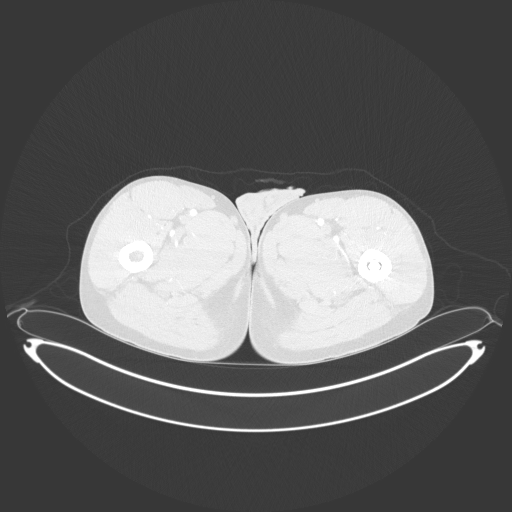
[im 308/343  soft-tissue]
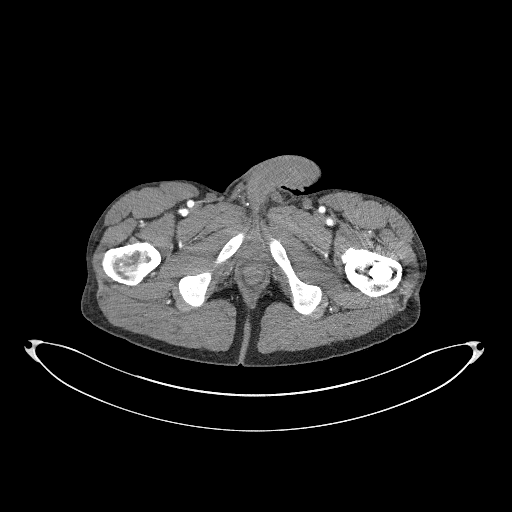
[im 308/343  lung]
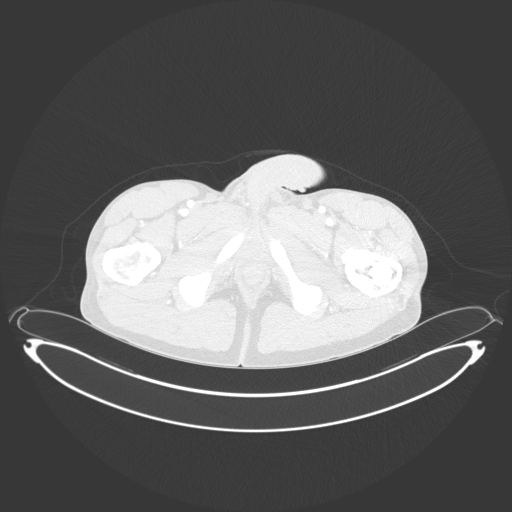
[im 308/343  bone]
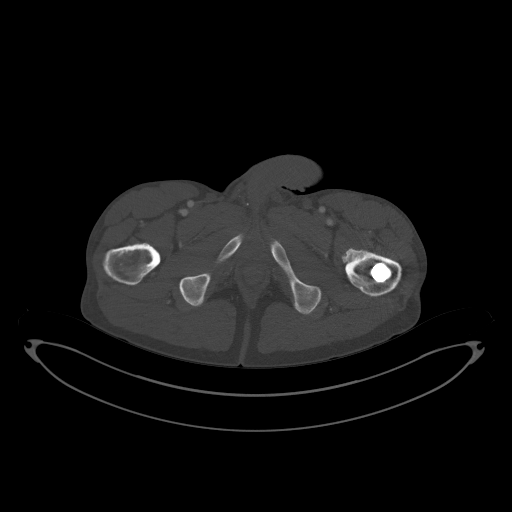
[im 325/343  lung]
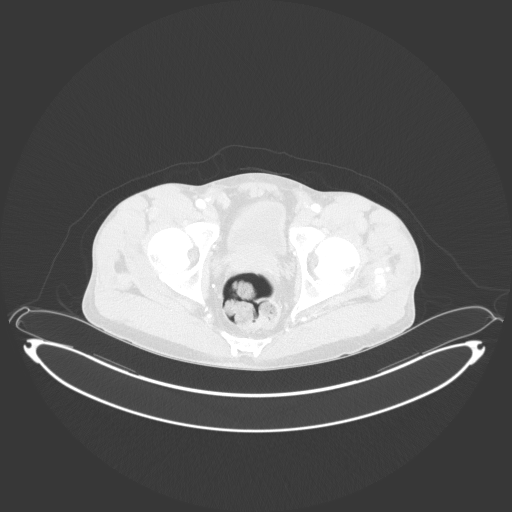

[12 of 46 positions shown; findings below may reference images not displayed]

FINDINGS: VASCULAR

Aorta: Minimal Aortic Atherosclerosis ([RF]-170.0). No aneurysm,
dissection, or stenosis.

Celiac: Patent without evidence of aneurysm, dissection, vasculitis
or significant stenosis.

SMA: Patent without evidence of aneurysm, dissection, vasculitis or
significant stenosis.

Renals: Both renal arteries are patent without evidence of aneurysm,
dissection, vasculitis, fibromuscular dysplasia or significant
stenosis.

IMA: Patent without evidence of aneurysm, dissection, vasculitis or
significant stenosis.

RIGHT Lower Extremity

Inflow: Mild atherosclerosis. No aneurysm, dissection, or stenosis.

Outflow: Common femoral atheromatous without stenosis. Deep femoral
branches patent. SFA patent. Popliteal patent.

Runoff: Patent three vessel runoff to the ankle.

LEFT Lower Extremity

Inflow: Mild atheromatous calcified plaque. No aneurysm, dissection,
or stenosis.

Outflow: Mild common femoral plaque. Deep femoral, SFA, popliteal
arteries patent.

Runoff: Patent three vessel runoff to the ankle.

Veins: No obvious venous abnormality within the limitations of this
arterial phase study.

Review of the MIP images confirms the above findings.

NON-VASCULAR

Lower chest: No pleural or pericardial effusion. Dependent
atelectasis posteriorly in the lung bases.

Hepatobiliary: No focal liver abnormality is seen. No gallstones,
gallbladder wall thickening, or biliary dilatation.

Pancreas: Unremarkable. No pancreatic ductal dilatation or
surrounding inflammatory changes.

Spleen: Normal in size without focal abnormality.

Adrenals/Urinary Tract: Adrenal glands unremarkable. Kidneys
unremarkable. No hydronephrosis.

Stomach/Bowel: Small hiatal hernia. Stomach is nondistended. Small
bowel decompressed. Normal appendix. The colon is nondilated.
Scattered sigmoid diverticula without significant adjacent
inflammatory/edematous change or abscess. Fecal distention of the
rectum.

Lymphatic: No abdominal or pelvic adenopathy.

Reproductive: Prostate is unremarkable.

Other: No ascites. No free air.

Musculoskeletal: Spondylitic changes in the lower lumbar spine. Left
femoral IM rod with proximal sliding screw across the left femoral
neck and distal interlocking screw. No acute fracture or worrisome
bone lesion.
IMPRESSION: VASCULAR

1. No acute findings.
2. Minimal aortoiliac Atherosclerosis ([RF]-170.0) without aneurysm
or stenosis.
3. Patent bilateral femoral-popliteal arterial systems with
three-vessel runoff.

NON-VASCULAR

1. No acute findings.
2. Small hiatal hernia.
3. Sigmoid diverticulosis.

## 2019-05-11 MED ORDER — GADOBUTROL 1 MMOL/ML IV SOLN
7.0000 mL | Freq: Once | INTRAVENOUS | Status: AC | PRN
  Administered 2019-05-11: 7 mL via INTRAVENOUS
  Filled 2019-05-11: qty 7.5

## 2019-05-11 MED ORDER — HYDROCODONE-ACETAMINOPHEN 5-325 MG PO TABS
1.0000 | ORAL_TABLET | Freq: Once | ORAL | Status: AC
  Administered 2019-05-11: 1 via ORAL
  Filled 2019-05-11: qty 1

## 2019-05-11 MED ORDER — IOHEXOL 350 MG/ML SOLN
125.0000 mL | Freq: Once | INTRAVENOUS | Status: AC | PRN
  Administered 2019-05-11: 125 mL via INTRAVENOUS
  Filled 2019-05-11: qty 125

## 2019-05-11 MED ORDER — PREDNISONE 10 MG PO TABS: ORAL_TABLET | ORAL | 0 refills | Status: DC

## 2019-05-11 MED ORDER — DEXAMETHASONE SODIUM PHOSPHATE 10 MG/ML IJ SOLN
10.0000 mg | Freq: Once | INTRAMUSCULAR | Status: AC
  Administered 2019-05-11: 10 mg via INTRAMUSCULAR
  Filled 2019-05-11: qty 1

## 2019-05-11 NOTE — ED Notes (Signed)
First Nurse Note: Pt ambulatory into ED stating that he woke up this morning and was "paralyzed" from the hip down on one side. Pt has not slurred speech, pt does not appear to be in any distress at this time.

## 2019-05-11 NOTE — ED Notes (Signed)
See triage note. Pt states he had surgery on left leg a year ago, but says he woke up this morning with pain and numbness in right leg and hip. Pt reports decreased sensation in lower right leg and hip pain. Pt is walking with crutches and has a notably altered gait favoring left leg and dragging right leg. Sensation is decreased, movement of right lower extremity is clumsy but intact. Pt denies other neurological s/s. Speech is clear.

## 2019-05-11 NOTE — ED Notes (Signed)
Right foot cold to touch  Faint pulse felt  Area marked

## 2019-05-11 NOTE — Discharge Instructions (Addendum)
You were seen today for right hip pain and right leg numbness.  Your x-rays were negative for any acute findings.  The CTA of your abdomen did not show any evidence of vascular compromise.  The MRI of your lumbar spine showed severe L4-L5 neural foraminal stenosis, which is causing the heaviness and numbness in your right leg.  I have given you a prescription for steroids to take for the next 9 days.  Please follow-up with neurosurgery as an outpatient.

## 2019-05-11 NOTE — ED Provider Notes (Signed)
[facility] Emergency Department Provider Note _____________________________________________ Time seen: 1310  I have reviewed the triage vital signs and the nursing notes.  HISTORY  Chief Complaint  Hip Pain and Leg Pain   HPI Benjamin Obrien is a 44 y.o. male presents to the ER today with c/o right hip pain and numbness of his right leg. The pain started this morning. He describes the pain as heaviness. He feels like he is having to drag his right leg. He denies tingling but is having weakness. He denies low back pain or trauma. He denies issues with bowel or bladder control. He has no history of sciatica or family history of vascular problems. He does smoke.   Past Medical History:  Diagnosis Date  . Pancreatitis     Patient Active Problem List   Diagnosis Date Noted  . 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 Hip fracture (HCC) 08/23/2017    Past Surgical History:  Procedure Laterality Date  . HIP SURGERY    . INTRAMEDULLARY (IM) NAIL INTERTROCHANTERIC Left 08/24/2017   Procedure: INTRAMEDULLARY (IM) NAIL INTERTROCHANTRIC-SHORT AFFIXUS;  Surgeon: Kennedy Bucker, MD;  Location: [facility] ORS;  Service: Orthopedics;  Laterality: Left;  . TOOTH EXTRACTION      Prior to Admission medications   Medication Sig Start Date End Date Taking? Authorizing Provider  predniSONE (DELTASONE) 10 MG tablet Take 3 tabs on days 1-3, take 2 tabs on days 4-6, take 1 tab on days 7-9 05/11/19   Lorre Munroe, NP    Allergies Patient has no known allergies.  History reviewed. No pertinent family history.  Social History Social History   Tobacco Use  . Smoking status: Current Every Day Smoker    Packs/day: 1.00    Types: Cigarettes  . Smokeless tobacco: Never Used  Substance Use Topics  . Alcohol use: Yes    Comment: Saturdays  . Drug use: Not on file    Review of Systems  Constitutional: Negative for fever, chills or body aches. Cardiovascular: Negative for chest pain, chest  tightness or swelling of the lower extremities. Respiratory: Negative for cough or shortness of breath. Gastrointestinal: Negative for abdominal pain, nausea, vomiting, constipation and diarrhea. Genitourinary: Negative for urgency, frequency, dysuria or blood in the urine.. Musculoskeletal: Negative for back pain. Skin: Negative for rash. Neurological: Positive for numbness and tingling of the right lower extremity. Negative for headaches, tingling. ____________________________________________  PHYSICAL EXAM:  VITAL SIGNS: ED Triage Vitals  Enc Vitals Group     BP 05/11/19 1254 100/68     Pulse Rate 05/11/19 1254 94     Resp 05/11/19 1254 18     Temp 05/11/19 1254 98.9 F (37.2 C)     Temp Source 05/11/19 1254 Oral     SpO2 05/11/19 1254 100 %     Weight 05/11/19 1254 149 lb (67.6 kg)     Height 05/11/19 1254 5\' 8"  (1.727 m)     Head Circumference --      Peak Flow --      Pain Score 05/11/19 1300 4     Pain Loc --      Pain Edu? --      Excl. in GC? --     Constitutional: Alert and oriented. Well appearing and in no distress. Head: Normocephalic and atraumatic. Cardiovascular: Normal rate, regular rhythm. Radial pulses 2+ bilaterally. Pedal pulse 2+ on the left. Unable to feel pedal/PT pulse on the right. Faint pedal pulse noted on the right with a doppler. Right  femoral pulse intact. RLE cool to touch. LLE warm to touch. Respiratory: Normal respiratory effort. No wheezes/rales/rhonchi. Gastrointestinal: Soft and nontender. No distention. Musculoskeletal: Normal flexion, extension, rotation and lateral bending of the spine. No bony tenderness noted over the spine. Normal flexion, extension, abduction, adduction and internal rotation of the right hip. Pain with external rotation of the right hip. Pain with palpation over the posterior iliac crest. Able to stand on right leg but drags it with ambulation. Strength 3/5 RLE, 5/5 LLE. Neurologic: Normal speech and language. Decreased  sensation to RLE. Skin:  No rash noted.  ____________________________________________   LABS  Recent Results (from the past 2160 hour(s))  CBC     Status: Abnormal   Collection Time: 05/11/19  1:55 PM  Result Value Ref Range   WBC 19.6 (H) 4.0 - 10.5 K/uL   RBC 4.65 4.22 - 5.81 MIL/uL   Hemoglobin 14.7 13.0 - 17.0 g/dL   HCT 43.4 39.0 - 52.0 %   MCV 93.3 80.0 - 100.0 fL   MCH 31.6 26.0 - 34.0 pg   MCHC 33.9 30.0 - 36.0 g/dL   RDW 12.3 11.5 - 15.5 %   Platelets 282 150 - 400 K/uL   nRBC 0.0 0.0 - 0.2 %    Comment: Performed at [facility], [address] [place], [place] [address]  Comprehensive metabolic panel     Status: Abnormal   Collection Time: 05/11/19  1:55 PM  Result Value Ref Range   Sodium 135 135 - 145 mmol/L   Potassium 4.5 3.5 - 5.1 mmol/L   Chloride 100 98 - 111 mmol/L   CO2 24 22 - 32 mmol/L   Glucose, Bld 133 (H) 70 - 99 mg/dL   BUN 29 (H) 6 - 20 mg/dL   Creatinine, Ser 1.28 (H) 0.61 - 1.24 mg/dL   Calcium 8.7 (L) 8.9 - 10.3 mg/dL   Total Protein 6.4 (L) 6.5 - 8.1 g/dL   Albumin 3.7 3.5 - 5.0 g/dL   AST 161 (H) 15 - 41 U/L   ALT 67 (H) 0 - 44 U/L   Alkaline Phosphatase 42 38 - 126 U/L   Total Bilirubin 0.7 0.3 - 1.2 mg/dL   GFR calc non Af Amer >60 >60 mL/min   GFR calc Af Amer >60 >60 mL/min   Anion gap 11 5 - 15    Comment: Performed at [facility], [address] [place], [place] [address]  Protime-INR     Status: None   Collection Time: 05/11/19  1:55 PM  Result Value Ref Range   Prothrombin Time 12.2 11.4 - 15.2 seconds   INR 0.9 0.8 - 1.2    Comment: (NOTE) INR goal varies based on device and disease states. Performed at [facility], [address], [place], [place] [address]     ____________________________________________   RADIOLOGY  Imaging Orders     DG Lumbar Spine 2-3 Views     DG Hip Unilat W or Wo Pelvis 2-3 Views Right     CT ANGIO AO+BIFEM W & OR WO CONTRAST     MR Lumbar Spine W Wo  Contrast   1. No acute findings.  2. Minimal aortoiliac Atherosclerosis (ICD10-170.0) without aneurysm  or stenosis.  3. Patent bilateral femoral-popliteal arterial systems with  three-vessel runoff.    NON-VASCULAR    1. No acute findings.  2. Small hiatal hernia.  3. Sigmoid diverticulosis.   IMPRESSION:  1. No acute abnormality of the lumbar  spine.  2. 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  3. Moderate L5-S1 facet arthrosis.    ____________________________________________  INITIAL IMPRESSION / ASSESSMENT AND PLAN / ED COURSE  Right Hip Pain, Right Leg Numbness:  Concern for vascular issue vs nerve issue CBC, CMET, PT/INR- elevated white count, elevated liver enzymes (hx of pancreatitis) Xray lumbar spine negative Xray right hip negative Vascular consulted- d/w Dr. Myra Gianotti at 1350. Advised to order CTA AO + Bifem with runoff Dr. Myra Gianotti in to assess the patient. Able to find pulse with doppler. MRI lumbar spine ordered- c/w severe L4-L5 neural foraminal stenosis Urinalysis negative Decadron 10 mg IM x 1 RX for Pred Taper x 9 days Will follow up with neurosurgery as an outpatient ____________________________________________  FINAL CLINICAL IMPRESSION(S) / ED DIAGNOSES  Final diagnoses:  Spinal stenosis at L4-L5 level      Lorre Munroe, NP 05/11/19 1900    Willy Eddy, MD 05/15/19 1152

## 2019-05-11 NOTE — ED Notes (Signed)
Left foot is notably cold compared to the right foot. Capillary refill is >4 seconds. Pedal pulse is 2+ in left, not palpable in right and not audible with doppler. Provider is aware.

## 2019-05-11 NOTE — ED Notes (Signed)
Pt back from MRI- requesting pain medication

## 2019-05-11 NOTE — Consult Note (Signed)
[facility] [place]  Patient name: Benjamin Obrien MRN: [id number] DOB: January 24, 1976 Sex: male   REQUESTING PROVIDER:   ER   REASON FOR CONSULT:    Cold right leg  HISTORY OF PRESENT ILLNESS:   Benjamin Obrien is a 44 y.o. male, who presented to the emergency department this afternoon stating that he woke up this morning and felt that his leg was "paralyzed" from the hip down on the right.  He has some difficulty moving his toes on the right.  He has decreased feeling in his foot.  The patient has had traumatic injury to the left leg in the past.  He otherwise has been healthy without complaints.  PAST MEDICAL HISTORY    Past Medical History:  Diagnosis Date  . Pancreatitis      FAMILY HISTORY   History reviewed. No pertinent family history.  SOCIAL HISTORY:   Social History   Socioeconomic History  . Marital status: Single    Spouse name: Not on file  . Number of children: Not on file  . Years of education: Not on file  . Highest education level: Not on file  Occupational History  . Not on file  Tobacco Use  . Smoking status: Current Every Day Smoker    Packs/day: 1.00    Types: Cigarettes  . Smokeless tobacco: Never Used  Substance and Sexual Activity  . Alcohol use: Yes    Comment: Saturdays  . Drug use: Not on file  . Sexual activity: Not on file  Other Topics Concern  . Not on file  Social History Narrative  . Not on file   Social Determinants of Health   Financial Resource Strain:   . Difficulty of Paying Living Expenses: Not on file  Food Insecurity:   . Worried About Programme researcher, broadcasting/film/video in the Last Year: Not on file  . Ran Out of Food in the Last Year: Not on file  Transportation Needs:   . Lack of Transportation (Medical): Not on file  . Lack of Transportation (Non-Medical): Not on file  Physical Activity:   . Days of Exercise per Week: Not on file  . Minutes of Exercise per Session:  Not on file  Stress:   . Feeling of Stress : Not on file  Social Connections:   . Frequency of Communication with Friends and Family: Not on file  . Frequency of Social Gatherings with Friends and Family: Not on file  . Attends Religious Services: Not on file  . Active Member of Clubs or Organizations: Not on file  . Attends Banker Meetings: Not on file  . Marital Status: Not on file  Intimate Partner Violence:   . Fear of Current or Ex-Partner: Not on file  . Emotionally Abused: Not on file  . Physically Abused: Not on file  . Sexually Abused: Not on file    ALLERGIES:    No Known Allergies  CURRENT MEDICATIONS:    No current facility-administered medications for this encounter.   No current outpatient medications on file.    REVIEW OF SYSTEMS:   [X]  denotes positive finding, [ ]  denotes negative finding Cardiac  Comments:  Chest pain or chest pressure:    Shortness of breath upon exertion:    Short of breath when lying flat:    Irregular heart rhythm:        Vascular    Pain in calf, thigh, or hip brought on by ambulation:  Pain in feet at night that wakes you up from your sleep:     Blood clot in your veins:    Leg swelling:         Pulmonary    Oxygen at home:    Productive cough:     Wheezing:         Neurologic    Sudden weakness in arms or legs:  x   Sudden numbness in arms or legs:  x   Sudden onset of difficulty speaking or slurred speech:    Temporary loss of vision in one eye:     Problems with dizziness:         Gastrointestinal    Blood in stool:      Vomited blood:         Genitourinary    Burning when urinating:     Blood in urine:        Psychiatric    Major depression:         Hematologic    Bleeding problems:    Problems with blood clotting too easily:        Skin    Rashes or ulcers:        Constitutional    Fever or chills:     PHYSICAL EXAM:   Vitals:   05/11/19 1254  BP: 100/68  Pulse: 94  Resp:  18  Temp: 98.9 F (37.2 C)  TempSrc: Oral  SpO2: 100%  Weight: 67.6 kg  Height: 5\' 8"  (1.727 m)    GENERAL: The patient is a well-nourished male, in no acute distress. The vital signs are documented above. CARDIAC: There is a regular rate and rhythm.  VASCULAR: Brisk dorsalis pedis and posterior tibial Doppler signals on the right.  Palpable femoral pulses.  The right foot is cool to the touch PULMONARY: Nonlabored respirations ABDOMEN: Soft and non-tender with normal pitched bowel sounds.  MUSCULOSKELETAL: There are no major deformities or cyanosis. NEUROLOGIC: Decreased sensation to the right foot as well as decreased movement of his toes and ankle SKIN: There are no ulcers or rashes noted. PSYCHIATRIC: The patient has a normal affect.  STUDIES:   I have reviewed his CT angiogram which does not show any acute issues regarding blood flow to his right leg  ASSESSMENT and PLAN   Despite having a cool right leg with motor and sensory deficits, he has brisk Doppler signals in his right dorsalis pedis and posterior tibial arteries.  In addition CT angiogram does not show any vascular issues.  Therefore, I would explore other alternatives to his acute presentation.  I do not feel this is a vascular problem.   Leia Alf, MD, FACS [facility] [phone] Pager [phone]

## 2019-05-11 NOTE — ED Triage Notes (Signed)
Pt states he woke up this AM with R hip pain and R lower leg numbness. Pt currently walking on crutches. Denies injury. Denies hx of DVT. Denies hx of sciatica.

## 2019-06-04 ENCOUNTER — Other Ambulatory Visit: Payer: Self-pay

## 2019-06-04 ENCOUNTER — Emergency Department
Admission: EM | Admit: 2019-06-04 | Discharge: 2019-06-04 | Disposition: A | Discharge status: Home / Self Care | Payer: Self-pay | Attending: Emergency Medicine | Admitting: Emergency Medicine

## 2019-06-04 ENCOUNTER — Emergency Department: Payer: Self-pay

## 2019-06-04 DIAGNOSIS — Y939 Activity, unspecified: Secondary | ICD-10-CM | POA: Insufficient documentation

## 2019-06-04 DIAGNOSIS — Y929 Unspecified place or not applicable: Secondary | ICD-10-CM | POA: Insufficient documentation

## 2019-06-04 DIAGNOSIS — M549 Dorsalgia, unspecified: Secondary | ICD-10-CM | POA: Insufficient documentation

## 2019-06-04 DIAGNOSIS — F17210 Nicotine dependence, cigarettes, uncomplicated: Secondary | ICD-10-CM | POA: Insufficient documentation

## 2019-06-04 DIAGNOSIS — Y999 Unspecified external cause status: Secondary | ICD-10-CM | POA: Insufficient documentation

## 2019-06-04 DIAGNOSIS — M5410 Radiculopathy, site unspecified: Secondary | ICD-10-CM | POA: Insufficient documentation

## 2019-06-04 DIAGNOSIS — X58XXXA Exposure to other specified factors, initial encounter: Secondary | ICD-10-CM | POA: Insufficient documentation

## 2019-06-04 DIAGNOSIS — S93401A Sprain of unspecified ligament of right ankle, initial encounter: Secondary | ICD-10-CM | POA: Insufficient documentation

## 2019-06-04 DIAGNOSIS — R262 Difficulty in walking, not elsewhere classified: Secondary | ICD-10-CM | POA: Insufficient documentation

## 2019-06-04 IMAGING — DX DG ANKLE COMPLETE 3+V*R*
3 series · 3 of 3 positions shown · non-contrast
Comparison: No comparison studies available.

CLINICAL DATA: Edema and pain lateral ankle.  Injury 2 days ago.

EXAM:
RIGHT ANKLE - COMPLETE 3+ VIEW

[ankle ap]
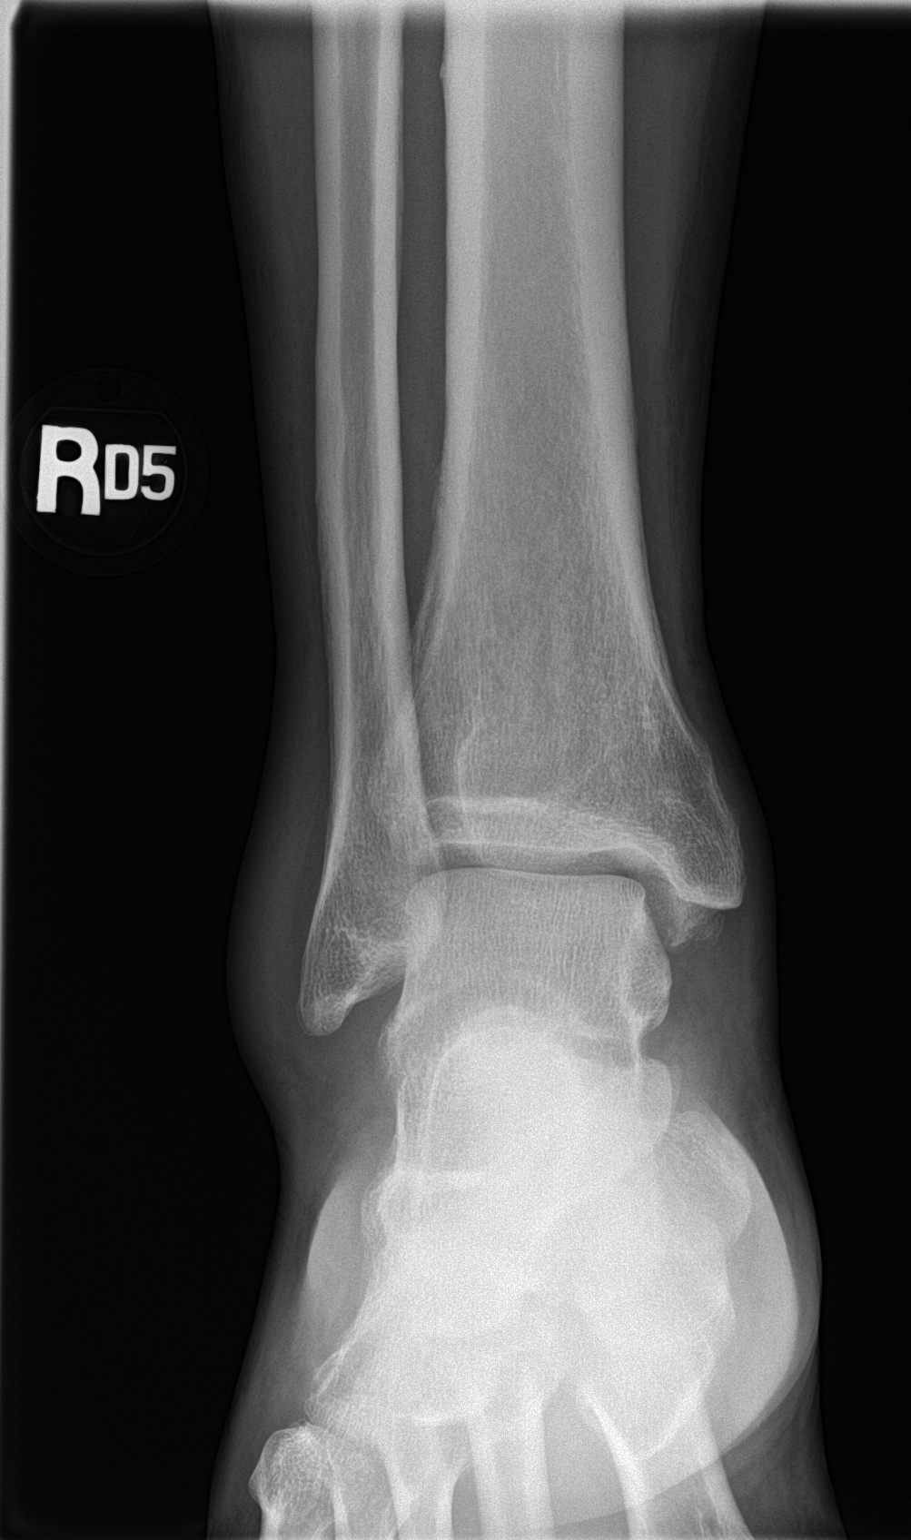

[ankle obl]
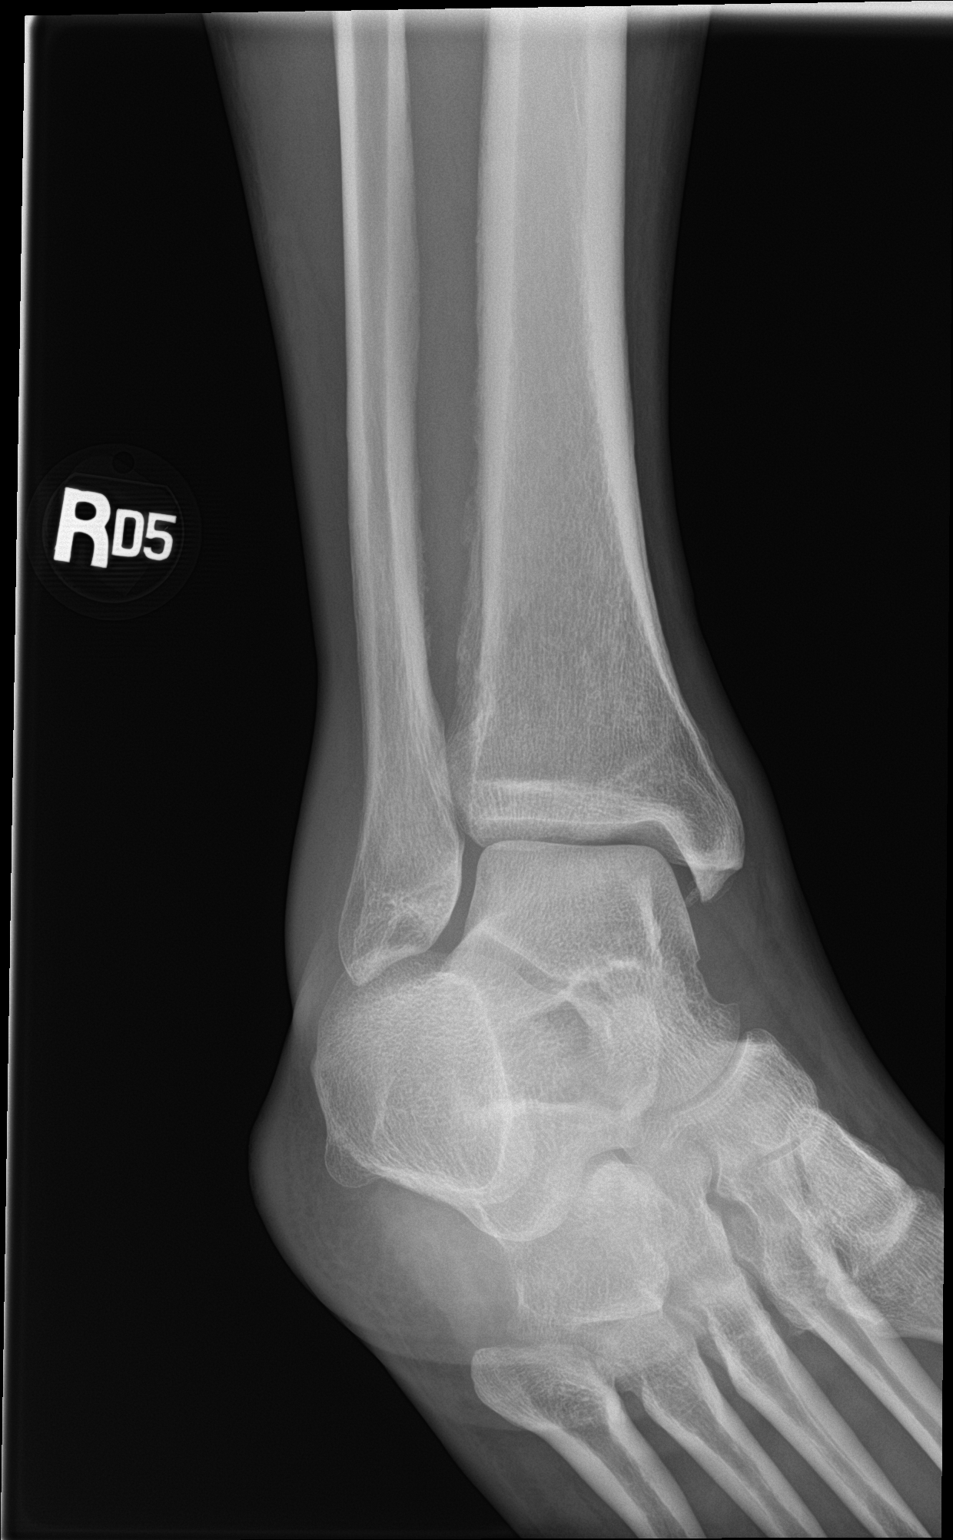

[ankle lat]
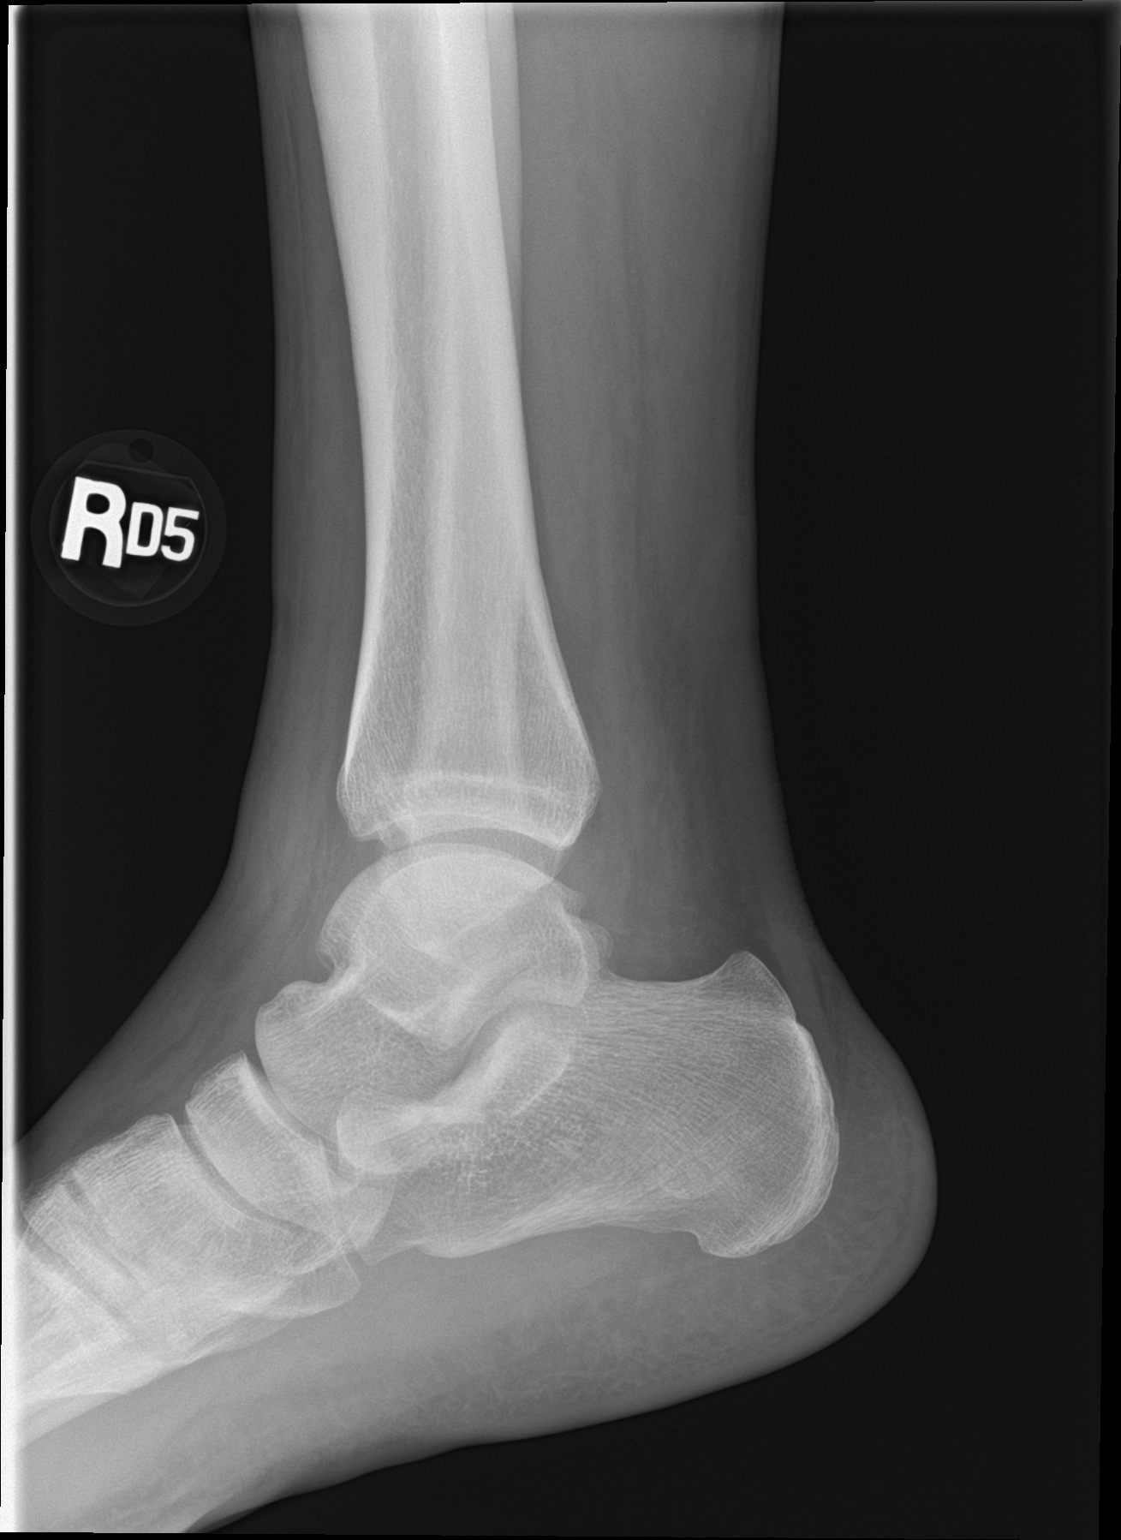

[3 of 3 positions shown; findings below may reference images not displayed]

FINDINGS: No evidence of an acute fracture. No subluxation or dislocation. No
worrisome lytic or sclerotic osseous abnormality. Lateral soft
tissue swelling evident.
IMPRESSION: Lateral soft tissue swelling without acute bony abnormality.

## 2019-06-04 MED ORDER — IBUPROFEN 600 MG PO TABS: 600.0000 mg | ORAL_TABLET | Freq: Three times a day (TID) | ORAL | 0 refills | Status: DC | PRN

## 2019-06-04 MED ORDER — IBUPROFEN 600 MG PO TABS
600.0000 mg | ORAL_TABLET | Freq: Once | ORAL | Status: AC
  Administered 2019-06-04: 600 mg via ORAL
  Filled 2019-06-04: qty 1

## 2019-06-04 MED ORDER — TRAMADOL HCL 50 MG PO TABS: 50.0000 mg | ORAL_TABLET | Freq: Four times a day (QID) | ORAL | 0 refills | Status: DC | PRN

## 2019-06-04 MED ORDER — TRAMADOL HCL 50 MG PO TABS
50.0000 mg | ORAL_TABLET | Freq: Once | ORAL | Status: AC
  Administered 2019-06-04: 50 mg via ORAL
  Filled 2019-06-04: qty 1

## 2019-06-04 NOTE — Discharge Instructions (Signed)
Follow discharge care instruction wear ankle support for 5 to 7 days as needed.  Ambulate with crutches.

## 2019-06-04 NOTE — ED Notes (Signed)
See triage note  States he stepped wrong  Twisted right ankle   Unable to bear wt  Good pulses  No deformity

## 2019-06-04 NOTE — ED Triage Notes (Signed)
Pt states he was seen recently with pinched nerve in his back and states he injured his left ankle about 2 days ago due to the change in gait. Pt arrives with one crutch that he has been using since the back issue.

## 2019-06-04 NOTE — ED Provider Notes (Signed)
[facility] Emergency Department Provider Note    ____________________________________________   First MD Initiated Contact with Patient 06/04/19 1035     (approximate)  I have reviewed the triage vital signs and the nursing notes.   HISTORY  Chief Complaint Ankle Pain    HPI Benjamin Obrien is a 44 y.o. male patient complain of right ankle pain secondary to twisting incident 2 days ago.  Patient states recently having back issues with numbness to the lower extremities causing abnormal gait.  Patient has internal fixations of the left hip.  Patient was seen this facility on 05/11/2019 and diagnosed with spinal stenosis L4 by MRI.  Patient state unable to follow-up with neurology secondary to lack of insurance.  Patient rates the pain as a 10/10.  Patient states pain increased with ambulation and prolonged standing.  Patient described the pain as "achy".  No palliative measure for complaint.         Past Medical History:  Diagnosis Date  . Pancreatitis     Patient Active Problem List   Diagnosis Date Noted  . 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 Hip fracture (HCC) 08/23/2017    Past Surgical History:  Procedure Laterality Date  . HIP SURGERY    . INTRAMEDULLARY (IM) NAIL INTERTROCHANTERIC Left 08/24/2017   Procedure: INTRAMEDULLARY (IM) NAIL INTERTROCHANTRIC-SHORT AFFIXUS;  Surgeon: Kennedy Bucker, MD;  Location: [facility] ORS;  Service: Orthopedics;  Laterality: Left;  . TOOTH EXTRACTION      Prior to Admission medications   Medication Sig Start Date End Date Taking? Authorizing Provider  ibuprofen (ADVIL) 600 MG tablet Take 1 tablet (600 mg total) by mouth every 8 (eight) hours as needed. 06/04/19   Joni Reining, PA-C  traMADol (ULTRAM) 50 MG tablet Take 1 tablet (50 mg total) by mouth every 6 (six) hours as needed. 06/04/19 06/03/20  Joni Reining, PA-C    Allergies Patient has no known allergies.  No family history on file.  Social History Social  History   Tobacco Use  . Smoking status: Current Every Day Smoker    Packs/day: 1.00    Types: Cigarettes  . Smokeless tobacco: Never Used  Substance Use Topics  . Alcohol use: Yes    Comment: Saturdays  . Drug use: Not on file    Review of Systems Constitutional: No fever/chills Eyes: No visual changes. ENT: No sore throat. Cardiovascular: Denies chest pain. Respiratory: Denies shortness of breath. Gastrointestinal: No abdominal pain.  No nausea, no vomiting.  No diarrhea.  No constipation. Genitourinary: Negative for dysuria. Musculoskeletal: Right ankle pain.   Skin: Negative for rash. Neurological: Negative for headaches, focal weakness or numbness. Endocrine:  Pancreatitis  ____________________________________________   PHYSICAL EXAM:  VITAL SIGNS: ED Triage Vitals  Enc Vitals Group     BP 06/04/19 1051 110/71     Pulse Rate 06/04/19 1051 (!) 114     Resp 06/04/19 1051 18     Temp 06/04/19 1051 98.3 F (36.8 C)     Temp Source 06/04/19 1051 Oral     SpO2 06/04/19 1051 98 %     Weight 06/04/19 1048 150 lb (68 kg)     Height 06/04/19 1048 5\' 8"  (1.727 m)     Head Circumference --      Peak Flow --      Pain Score 06/04/19 1048 10     Pain Loc --      Pain Edu? --      Excl. in GC? --  Constitutional: Alert and oriented. Well appearing and in no acute distress. Neck: No stridor.   Hematological/Lymphatic/Immunilogical: No cervical lymphadenopathy. Cardiovascular: Tachycardic regular rhythm. Grossly normal heart sounds.  Good peripheral circulation. Respiratory: Normal respiratory effort.  No retractions. Lungs CTAB. Gastrointestinal: Soft and nontender. No distention. No abdominal bruits. No CVA tenderness. Musculoskeletal: No obvious deformity to the right ankle.  Moderate edema to the lateral aspect.   Neurologic:  Normal speech and language. No gross focal neurologic deficits are appreciated. No gait instability. Skin:  Skin is warm, dry and intact. No  rash noted. Psychiatric: Mood and affect are normal. Speech and behavior are normal.  ____________________________________________   LABS (all labs ordered are listed, but only abnormal results are displayed)  Labs Reviewed - No data to display ____________________________________________  EKG   ____________________________________________  RADIOLOGY  ED MD interpretation:    Official radiology report(s): DG Ankle Complete Right  Result Date: 06/04/2019 CLINICAL DATA:  Edema and pain lateral ankle.  Injury 2 days ago. EXAM: RIGHT ANKLE - COMPLETE 3+ VIEW COMPARISON:  No comparison studies available. FINDINGS: No evidence of an acute fracture. No subluxation or dislocation. No worrisome lytic or sclerotic osseous abnormality. Lateral soft tissue swelling evident. IMPRESSION: Lateral soft tissue swelling without acute bony abnormality. Electronically Signed   By: Misty Stanley M.D.   On: 06/04/2019 11:27    ____________________________________________   PROCEDURES  Procedure(s) performed (including Critical Care):  Procedures   ____________________________________________   INITIAL IMPRESSION / ASSESSMENT AND PLAN / ED COURSE  As part of my medical decision making, I reviewed the following data within the [place]     Patient presents right ankle pain secondary to a twisting incident 2 days ago.  Discussed x-ray findings with patient states her ankle sprain.  Patient placed in ankle splint and advised to use crutches assist with ambulation.  [facility].    Benjamin Obrien was evaluated in Emergency Department on 06/04/2019 for the symptoms described in the history of present illness. He was evaluated in the context of the global COVID-19 pandemic, which necessitated consideration that the patient might be at risk for infection with the SARS-CoV-2 virus that causes COVID-19. Institutional protocols and algorithms  that pertain to the evaluation of patients at risk for COVID-19 are in a state of rapid change based on information released by regulatory bodies including the CDC and federal and state organizations. These policies and algorithms were followed during the patient's care in the ED.       ____________________________________________   FINAL CLINICAL IMPRESSION(S) / ED DIAGNOSES  Final diagnoses:  Sprain of right ankle, unspecified ligament, initial encounter     ED Discharge Orders         Ordered    traMADol (ULTRAM) 50 MG tablet  Every 6 hours PRN     06/04/19 1145    ibuprofen (ADVIL) 600 MG tablet  Every 8 hours PRN     06/04/19 1145           Note:  This document was prepared using Dragon voice recognition software and may include unintentional dictation errors.    Sable Feil, PA-C 06/04/19 1150    Earleen Newport, MD 06/04/19 1250

## 2019-08-08 ENCOUNTER — Other Ambulatory Visit: Payer: Self-pay

## 2019-08-08 ENCOUNTER — Encounter: Payer: Self-pay | Admitting: Gerontology

## 2019-08-08 ENCOUNTER — Ambulatory Visit: Payer: Self-pay | Admitting: Gerontology

## 2019-08-08 VITALS — BP 103/69 | HR 94 | Temp 97.7°F | Resp 18 | Ht 67.0 in | Wt 141.7 lb

## 2019-08-08 DIAGNOSIS — F17200 Nicotine dependence, unspecified, uncomplicated: Secondary | ICD-10-CM

## 2019-08-08 DIAGNOSIS — R200 Anesthesia of skin: Secondary | ICD-10-CM

## 2019-08-08 DIAGNOSIS — Z7689 Persons encountering health services in other specified circumstances: Secondary | ICD-10-CM

## 2019-08-08 DIAGNOSIS — IMO0001 Reserved for inherently not codable concepts without codable children: Secondary | ICD-10-CM

## 2019-08-08 MED ORDER — GABAPENTIN 100 MG PO CAPS: 100.0000 mg | ORAL_CAPSULE | Freq: Every day | ORAL | 0 refills | Status: DC

## 2019-08-08 NOTE — Progress Notes (Signed)
Patient ID: Benjamin Obrien, male   DOB: 21-Apr-1975, 44 y.o.   MRN: [id number]  No chief complaint on file.   HPI Benjamin Obrien is a 44 y.o. male who presents to establish care and evaluation of his chronic condition. He was seen at the ED on 06/04/2019 for right ankle pain. X ray to right ankle was done and it showed Lateral soft tissue swelling without acute bony abnormality per Dr Tery Sanfilippo E.  Currently, he states that he continues to expeience shooting,numb and tingling sensation to his right ankle and taking Ibuprofen does not relieve symptoms.  He reports that taking one capsule of gabapentin from one of his family members relieved his symptoms. He continues to smoke more than one pack of cigarette daily and does not admit the desire to quit. Overall, he states that he's doing well and offers no further complaint.   Past Medical History:  Diagnosis Date  . Pancreatitis     Past Surgical History:  Procedure Laterality Date  . HIP SURGERY    . INTRAMEDULLARY (IM) NAIL INTERTROCHANTERIC Left 08/24/2017   Procedure: INTRAMEDULLARY (IM) NAIL INTERTROCHANTRIC-SHORT AFFIXUS;  Surgeon: Hessie Knows, MD;  Location: [facility] ORS;  Service: Orthopedics;  Laterality: Left;  . TOOTH EXTRACTION      No family history on file.  Social History Social History   Tobacco Use  . Smoking status: Current Every Day Smoker    Packs/day: 1.00    Types: Cigarettes  . Smokeless tobacco: Never Used  Substance Use Topics  . Alcohol use: Yes    Comment: Saturdays  . Drug use: Not on file    No Known Allergies  Current Outpatient Medications  Medication Sig Dispense Refill  . gabapentin (NEURONTIN) 100 MG capsule Take 1 capsule (100 mg total) by mouth at bedtime. 30 capsule 0   No current facility-administered medications for this visit.    Review of Systems Review of Systems  Constitutional: Negative.   HENT: Negative.   Eyes: Negative.   Respiratory: Negative.   Cardiovascular: Negative.    Gastrointestinal: Negative.   Endocrine: Negative.   Genitourinary: Negative.   Musculoskeletal: Negative.   Skin: Negative.   Allergic/Immunologic: Negative.   Neurological: Positive for numbness (R. Lower Extremity numbness).  Hematological: Negative.   Psychiatric/Behavioral: Negative.     Blood pressure 103/69, pulse 94, temperature 97.7 F (36.5 C), resp. rate 18, height _0  (1.702 m), weight 141 lb 11.2 oz (64.3 kg), SpO2 92 %.  Physical Exam Physical Exam Constitutional:      Appearance: Normal appearance.  HENT:     Head: Normocephalic and atraumatic.     Nose: Nose normal.     Mouth/Throat:     Mouth: Mucous membranes are moist.  Eyes:     Extraocular Movements: Extraocular movements intact.     Pupils: Pupils are equal, round, and reactive to light.  Cardiovascular:     Rate and Rhythm: Normal rate and regular rhythm.     Pulses: Normal pulses.     Heart sounds: Normal heart sounds.  Pulmonary:     Effort: Pulmonary effort is normal.     Breath sounds: Normal breath sounds.  Abdominal:     General: Abdomen is flat. Bowel sounds are normal.     Palpations: Abdomen is soft.  Genitourinary:    Comments: Deferred per patient Musculoskeletal:        General: No signs of injury.     Cervical back: Normal range of motion and neck supple.  Right lower leg: Right lower leg edema: Trace edema to right ankle.  Skin:    General: Skin is warm and dry.  Neurological:     General: No focal deficit present.     Mental Status: He is alert.  Psychiatric:        Mood and Affect: Mood normal.        Behavior: Behavior normal.        Thought Content: Thought content normal.        Judgment: Judgment normal.     Data Reviewed  Lab and past medical history was reviewed.  Assessment and Plan  1. Numbness and tingling - He will continue on 100 mg gabapentin daily, was educated on medication side effects and advised to notify clinic. He is to rest, ice and elevate  right ankle. - gabapentin (NEURONTIN) 100 MG capsule; Take 1 capsule (100 mg total) by mouth at bedtime.  Dispense: 30 capsule; Refill: 0  2. Encounter to establish care -Routine labs will be checked. - Comp Met (CMET); Future - CBC w/Diff; Future - TSH; Future - Urinalysis; Future - HgB A1c; Future  3. Smoking - He was strongly encouraged on smoking cessation and provided with [place] Quitline information.  Follow up:  08/21/2019 or if symptom worsens or fails to improve.  Rawleigh Rode E Rena Sweeden 08/09/2019, 10:46 PM

## 2019-08-14 ENCOUNTER — Other Ambulatory Visit: Payer: Self-pay

## 2019-08-14 DIAGNOSIS — Z7689 Persons encountering health services in other specified circumstances: Secondary | ICD-10-CM

## 2019-08-15 LAB — URINALYSIS
Bilirubin, UA: NEGATIVE
Glucose, UA: NEGATIVE
Ketones, UA: NEGATIVE
Leukocytes,UA: NEGATIVE
Nitrite, UA: NEGATIVE
Protein,UA: NEGATIVE
RBC, UA: NEGATIVE
Specific Gravity, UA: 1.022 (ref 1.005–1.030)
Urobilinogen, Ur: 0.2 mg/dL (ref 0.2–1.0)
pH, UA: 5.5 (ref 5.0–7.5)

## 2019-08-15 LAB — COMPREHENSIVE METABOLIC PANEL
ALT: 25 IU/L (ref 0–44)
AST: 38 IU/L (ref 0–40)
Albumin/Globulin Ratio: 2.1 (ref 1.2–2.2)
Albumin: 4.2 g/dL (ref 4.0–5.0)
Alkaline Phosphatase: 67 IU/L (ref 48–121)
BUN/Creatinine Ratio: 10 (ref 9–20)
BUN: 9 mg/dL (ref 6–24)
Bilirubin Total: 0.3 mg/dL (ref 0.0–1.2)
CO2: 24 mmol/L (ref 20–29)
Calcium: 9.8 mg/dL (ref 8.7–10.2)
Chloride: 100 mmol/L (ref 96–106)
Creatinine, Ser: 0.92 mg/dL (ref 0.76–1.27)
GFR calc Af Amer: 117 mL/min/{1.73_m2} (ref >59)
GFR calc non Af Amer: 101 mL/min/{1.73_m2} (ref >59)
Globulin, Total: 2 g/dL (ref 1.5–4.5)
Glucose: 121 mg/dL — ABNORMAL HIGH (ref 65–99)
Potassium: 4.2 mmol/L (ref 3.5–5.2)
Sodium: 140 mmol/L (ref 134–144)
Total Protein: 6.2 g/dL (ref 6.0–8.5)

## 2019-08-15 LAB — CBC WITH DIFFERENTIAL/PLATELET
Basophils Absolute: 0.1 10*3/uL (ref 0.0–0.2)
Basos: 1 %
EOS (ABSOLUTE): 0.1 10*3/uL (ref 0.0–0.4)
Eos: 1 %
Hematocrit: 46 % (ref 37.5–51.0)
Hemoglobin: 15.7 g/dL (ref 13.0–17.7)
Immature Grans (Abs): 0 10*3/uL (ref 0.0–0.1)
Immature Granulocytes: 0 %
Lymphocytes Absolute: 1.3 10*3/uL (ref 0.7–3.1)
Lymphs: 14 %
MCH: 31.5 pg (ref 26.6–33.0)
MCHC: 34.1 g/dL (ref 31.5–35.7)
MCV: 92 fL (ref 79–97)
Monocytes Absolute: 1.1 10*3/uL — ABNORMAL HIGH (ref 0.1–0.9)
Monocytes: 11 %
Neutrophils Absolute: 6.9 10*3/uL (ref 1.4–7.0)
Neutrophils: 73 %
Platelets: 377 10*3/uL (ref 150–450)
RBC: 4.99 x10E6/uL (ref 4.14–5.80)
RDW: 12.2 % (ref 11.6–15.4)
WBC: 9.5 10*3/uL (ref 3.4–10.8)

## 2019-08-15 LAB — HEMOGLOBIN A1C
Est. average glucose Bld gHb Est-mCnc: 100 mg/dL
Hgb A1c MFr Bld: 5.1 % (ref 4.8–5.6)

## 2019-08-15 LAB — TSH: TSH: 0.452 u[IU]/mL (ref 0.450–4.500)

## 2019-08-20 ENCOUNTER — Ambulatory Visit: Payer: Self-pay | Admitting: Gerontology

## 2019-08-21 ENCOUNTER — Telehealth: Payer: Self-pay | Admitting: Gerontology

## 2019-08-21 NOTE — Telephone Encounter (Signed)
Called pt to r/s a missed appt. Explained no show policy and that he will owe a $5 fee at his next appt

## 2019-08-28 ENCOUNTER — Other Ambulatory Visit: Payer: Self-pay

## 2019-08-28 ENCOUNTER — Ambulatory Visit: Payer: Self-pay | Admitting: Gerontology

## 2019-09-19 ENCOUNTER — Encounter: Payer: Self-pay | Admitting: Gerontology

## 2019-09-19 ENCOUNTER — Ambulatory Visit: Payer: Self-pay | Admitting: Gerontology

## 2019-09-19 ENCOUNTER — Other Ambulatory Visit: Payer: Self-pay

## 2019-09-19 VITALS — BP 119/85 | HR 90 | Wt 140.0 lb

## 2019-09-19 DIAGNOSIS — R200 Anesthesia of skin: Secondary | ICD-10-CM

## 2019-09-19 DIAGNOSIS — R202 Paresthesia of skin: Secondary | ICD-10-CM

## 2019-09-19 MED ORDER — GABAPENTIN 300 MG PO CAPS: 300.0000 mg | ORAL_CAPSULE | Freq: Two times a day (BID) | ORAL | 0 refills | Status: DC

## 2019-09-19 NOTE — Progress Notes (Signed)
Established Patient Office Visit  Subjective:  Patient ID: Benjamin Obrien, male    DOB: 1975/07/16  Age: 44 y.o. MRN: [id number]  CC:  Chief Complaint  Patient presents with   Ankle Pain    no improvement    HPI Benjamin Obrien is a 44.y.o male who presents for follow up for right ankle pain, numbness and tingling. He verbalized that taking the 100 mg Gabapentin he was prescribed did not improve his symptom. He verbalized that due to increased pain he had to take more than the prescribed dosage which resulted in him running out of his medication. In addition, he verbalized having to borrow additional Gabapentin from a friend. He also complained of  non-radiating intermittent numbness to left shoulder that started after his last visit. He is scheduled to see Dr. Justice Rocher on 09/24/2019. Overall, he states that he is doing well and offer no additional complaint.    [facility] with dr. Justice Rocher Tuesday 09/24/2019 Past Medical History:  Diagnosis Date   Pancreatitis     Past Surgical History:  Procedure Laterality Date   HIP SURGERY     INTRAMEDULLARY (IM) NAIL INTERTROCHANTERIC Left 08/24/2017   Procedure: INTRAMEDULLARY (IM) NAIL INTERTROCHANTRIC-SHORT AFFIXUS;  Surgeon: Kennedy Bucker, MD;  Location: [facility] ORS;  Service: Orthopedics;  Laterality: Left;   TOOTH EXTRACTION      No family history on file.  Social History   Socioeconomic History   Marital status: Single    Spouse name: Not on file   Number of children: Not on file   Years of education: Not on file   Highest education level: Not on file  Occupational History   Not on file  Tobacco Use   Smoking status: Current Every Day Smoker    Packs/day: 1.00    Types: Cigarettes   Smokeless tobacco: Never Used  Substance and Sexual Activity   Alcohol use: Yes    Comment: Saturdays   Drug use: Not on file   Sexual activity: Not on file  Other Topics Concern   Not on file  Social History Narrative   Not  on file   Social Determinants of Health   Financial Resource Strain:    Difficulty of Paying Living Expenses:   Food Insecurity:    Worried About Programme researcher, broadcasting/film/video in the Last Year:    Barista in the Last Year:   Transportation Needs:    Freight forwarder (Medical):    Lack of Transportation (Non-Medical):   Physical Activity:    Days of Exercise per Week:    Minutes of Exercise per Session:   Stress:    Feeling of Stress :   Social Connections:    Frequency of Communication with Friends and Family:    Frequency of Social Gatherings with Friends and Family:    Attends Religious Services:    Active Member of Clubs or Organizations:    Attends Engineer, structural:    Marital Status:   Intimate Partner Violence:    Fear of Current or Ex-Partner:    Emotionally Abused:    Physically Abused:    Sexually Abused:     Outpatient Medications Prior to Visit  Medication Sig Dispense Refill   gabapentin (NEURONTIN) 100 MG capsule Take 1 capsule (100 mg total) by mouth at bedtime. 30 capsule 0   No facility-administered medications prior to visit.    No Known Allergies  ROS Review of Systems  Constitutional: Negative.   Respiratory:  Negative.   Cardiovascular: Negative.   Gastrointestinal: Negative.   Genitourinary: Negative.   Musculoskeletal: Positive for joint swelling (.Right ankle swelling has an Ortho appt  with Dr. Justice Rocher ).  Skin: Negative.   Neurological: Negative.   Psychiatric/Behavioral: Negative.       Objective:    Physical Exam Constitutional:      Appearance: Normal appearance.  HENT:     Head: Normocephalic and atraumatic.  Cardiovascular:     Rate and Rhythm: Normal rate and regular rhythm.     Pulses: Normal pulses.     Heart sounds: Normal heart sounds.  Pulmonary:     Effort: Pulmonary effort is normal.     Breath sounds: Normal breath sounds.  Musculoskeletal:     Right lower leg: Edema present.   Skin:    Findings: Lesion: .Non-pitting edema in right ankle   Neurological:     General: No focal deficit present.     Mental Status: He is alert and oriented to person, place, and time.  Psychiatric:        Mood and Affect: Mood normal.        Behavior: Behavior normal.        Thought Content: Thought content normal.        Judgment: Judgment normal.     BP 119/85 (BP Location: Left Arm, Patient Position: Sitting)    Pulse 90    Wt 140 lb (63.5 kg)    SpO2 98%    BMI 21.29 kg/m  Wt Readings from Last 3 Encounters:  09/19/19 140 lb (63.5 kg)  08/14/19 140 lb 12.8 oz (63.9 kg)  08/08/19 141 lb 11.2 oz (64.3 kg)     Health Maintenance Due  Topic Date Due   Hepatitis C Screening  Never done   COVID-19 Vaccine (1) Never done   TETANUS/TDAP  Never done    There are no preventive care reminders to display for this patient.  Lab Results  Component Value Date   TSH 0.452 08/14/2019   Lab Results  Component Value Date   WBC 9.5 08/14/2019   HGB 15.7 08/14/2019   HCT 46.0 08/14/2019   MCV 92 08/14/2019   PLT 377 08/14/2019   Lab Results  Component Value Date   NA 140 08/14/2019   K 4.2 08/14/2019   CO2 24 08/14/2019   GLUCOSE 121 (H) 08/14/2019   BUN 9 08/14/2019   CREATININE 0.92 08/14/2019   BILITOT 0.3 08/14/2019   ALKPHOS 67 08/14/2019   AST 38 08/14/2019   ALT 25 08/14/2019   PROT 6.2 08/14/2019   ALBUMIN 4.2 08/14/2019   CALCIUM 9.8 08/14/2019   ANIONGAP 11 05/11/2019   No results found for: CHOL No results found for: HDL No results found for: LDLCALC No results found for: TRIG No results found for: [facility] Lab Results  Component Value Date   HGBA1C 5.1 08/14/2019      Assessment & Plan:   1. Numbness and tingling He states that his symptom is not under control with taking 100 MG of Gabapentin BID. His dose was increased to 300 Mg BID.    - gabapentin (NEURONTIN) 300 MG capsule; Take 1 capsule (300 mg total) by mouth 2 (two) times daily.   Dispense: 60 capsule; Refill: 0  - He has an appointment scheduled with Dr. Justice Rocher 09/24/2019.   Follow-up: Return in about 5 weeks (around 10/24/2019), or if symptoms worsen or fail to improve.    Onnie Graham, RN

## 2019-09-24 ENCOUNTER — Other Ambulatory Visit: Payer: Self-pay

## 2019-09-24 ENCOUNTER — Ambulatory Visit: Payer: Self-pay | Admitting: Specialist

## 2019-09-24 DIAGNOSIS — R200 Anesthesia of skin: Secondary | ICD-10-CM

## 2019-09-24 NOTE — Progress Notes (Signed)
   Subjective:    Patient ID: Benjamin Obrien, male    DOB: 1975/11/14, 44 y.o.   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 Was in the ED, x-rays neg. For fracture. Has loss of sensation distal R foot. He has been on gabapentin. He is not a diabetic. He's also now developing similar poss of sensation to the LUE.   PMH: There is a fracture of the left femur/hip. Treated with an IM rod.    Review of Systems     Objective:   Physical Exam His gait is normal but is unable to stand on his toes or heels on the R side.   DTRs 0 KJ. 1+ AJ  On inspection, there are no trophic skin changes or nail changes. Figure of 8 measurements are equal but the R lateral malleolus does look larger.  He is TTP over the lateral malleolus but not over the ATFL.   Sensation is diminished on the dorsum of the foot at the MP joints distally of all five toes. He has sensation on the planter surface.   Proprioception is intact. Vibratory senses intact except diminished on the R forefoot..      Assessment & Plan:  Refer to neurology for their input.

## 2019-10-24 ENCOUNTER — Ambulatory Visit: Payer: Self-pay | Admitting: Gerontology

## 2019-10-24 ENCOUNTER — Other Ambulatory Visit: Payer: Self-pay

## 2019-10-24 DIAGNOSIS — R200 Anesthesia of skin: Secondary | ICD-10-CM

## 2019-10-24 DIAGNOSIS — R202 Paresthesia of skin: Secondary | ICD-10-CM

## 2020-01-02 ENCOUNTER — Ambulatory Visit: Payer: Self-pay | Admitting: Neurology

## 2020-01-14 ENCOUNTER — Ambulatory Visit: Payer: Self-pay | Admitting: Gerontology

## 2020-01-21 ENCOUNTER — Telehealth: Payer: Self-pay

## 2020-01-21 NOTE — Telephone Encounter (Signed)
Called patient to administer the social determinants of health but his voicemail was full and social work intern was not able to leave a message.

## 2020-01-22 ENCOUNTER — Other Ambulatory Visit: Payer: Self-pay

## 2020-01-22 ENCOUNTER — Ambulatory Visit: Payer: Self-pay | Admitting: Gerontology

## 2020-01-22 ENCOUNTER — Other Ambulatory Visit: Payer: Self-pay | Admitting: Gerontology

## 2020-01-22 VITALS — BP 122/83 | HR 101 | Temp 98.7°F | Resp 16 | Ht 68.0 in | Wt 147.8 lb

## 2020-01-22 DIAGNOSIS — G8929 Other chronic pain: Secondary | ICD-10-CM | POA: Insufficient documentation

## 2020-01-22 DIAGNOSIS — R202 Paresthesia of skin: Secondary | ICD-10-CM

## 2020-01-22 DIAGNOSIS — R200 Anesthesia of skin: Secondary | ICD-10-CM

## 2020-01-22 DIAGNOSIS — M25552 Pain in left hip: Secondary | ICD-10-CM

## 2020-01-22 DIAGNOSIS — R4589 Other symptoms and signs involving emotional state: Secondary | ICD-10-CM | POA: Insufficient documentation

## 2020-01-22 DIAGNOSIS — F418 Other specified anxiety disorders: Secondary | ICD-10-CM | POA: Insufficient documentation

## 2020-01-22 MED ORDER — IBUPROFEN 600 MG PO TABS: 600.0000 mg | ORAL_TABLET | Freq: Two times a day (BID) | ORAL | 0 refills | Status: DC

## 2020-01-22 MED ORDER — GABAPENTIN 300 MG PO CAPS: 300.0000 mg | ORAL_CAPSULE | Freq: Three times a day (TID) | ORAL | 1 refills | Status: DC

## 2020-01-22 NOTE — Telephone Encounter (Signed)
Called patient and could not leave a message because voicemail was full.  Patient called back, seconds later.  Social work Tax inspector explained that the purpose of the call was because of patients ranking on the PHQ 9.  Social work Tax inspector asked if, in regard to the score of 3 (nearly everyday) for the statement, "Thoughts that you would be better off dead, or of hurting yourself in some way", if patient had a plan of hurting himself and patient replied that he did not.  Patient discussed knowing that he had a history of this.  Patient also stated that he was not trying to hurt himself but becomes more depressed because he can't work, pay bills and is stuck in the house.  Social work Tax inspector asked patient if he had an email address where she could send information, such as [facility]'s crisis, address, and the mobile crisis number.  Patient stated that he did not have an email but that he would be coming into the [facility] tomorrow and social work intern let him know that she will have those resources ready for him.  Social work Tax inspector also informed patient that if he has any  thoughts of hurting himself to go to the emergency department.  Patient was traveling and unable to take any notes or phone numbers down and did not know how to enter [facility]'s phone number into his phone while talking on his phone.  Social work Tax inspector also, with patient's permission, made an appointment for him to see  Rhett Bannister for an integrated behavioral health appointment.
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
## 2020-01-23 NOTE — Telephone Encounter (Signed)
Called patient to check on him and check to see if he is still coming in today so that social work intern might pass on resources for [facility]'s crisis phone number, the mobile crisis number, and reiterate the emergency department option if immediate intervention is needed.  Also wanted to make patient an appointment for November 17th to see Rhett Bannister, if he so chooses to come in earlier.  Could not leave message because patient's voicemail was full.  Then social work Tax inspector called patient's father and left a voicemail letting him know that she has some resources to give to patient and to please ask him to call the clinic and left the clinic's phone number.

## 2020-01-23 NOTE — Telephone Encounter (Signed)
Social work Tax inspector called patient again to check on him and to see if he was still going to make it into the clinic so that I could hand him the resources for [facility], the crisis line, and the mobile crisis line.  Patient did not know if he would be able to make it in and social work Market researcher to mail the resource consisting of the [employer], containing [facility]'s phone number, crisis line, address, mobile crisis line, and the two appointments that were scheduled for him to see the clinic social worker, [employer].  Patient was also provided with the [facility] crisis number and the mobile crisis line phone number over the phone.  Social work Tax inspector encouraged patient to reach out to one of these resources if he needs to and if he is feeling as if he will hurt himself.  His resource (see above) was put in the mailbox the same day.

## 2020-02-04 NOTE — Telephone Encounter (Signed)
Social work Tax inspector confirmed patient's integrated behavioral mental health appointment for tomorrow at 10 am.  Patient confirmed that he will be coming in person to the clinic.

## 2020-02-05 ENCOUNTER — Ambulatory Visit: Payer: Self-pay | Admitting: Licensed Clinical Social Worker

## 2020-02-05 ENCOUNTER — Institutional Professional Consult (permissible substitution): Payer: Self-pay | Admitting: Licensed Clinical Social Worker

## 2020-02-05 DIAGNOSIS — Z7689 Persons encountering health services in other specified circumstances: Secondary | ICD-10-CM

## 2020-02-06 ENCOUNTER — Telehealth: Payer: Self-pay

## 2020-02-06 NOTE — Telephone Encounter (Signed)
Social work Tax inspector called patient to inform him that his appointment had been changed to lessen transportation stressors.  Patient asked for a call back to remind him of these appointments.
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
## 2020-02-10 ENCOUNTER — Other Ambulatory Visit: Payer: Self-pay

## 2020-02-10 ENCOUNTER — Ambulatory Visit: Payer: Self-pay | Admitting: Pharmacy Technician

## 2020-02-10 DIAGNOSIS — Z79899 Other long term (current) drug therapy: Secondary | ICD-10-CM

## 2020-02-10 NOTE — Progress Notes (Signed)
Completed [facility] application and contract.  Patient agreed to all terms of the [facility] contract.    Patient approved to receive medication assistance at [facility] until time for re-certification in 2022, and as long as eligibility criteria continues to be met.    Provided patient with community resource material based on his particular needs.    Cathi Hazan J. Lexus Shampine Care Manager [facility]ication Management Clinic  

## 2020-02-11 NOTE — Telephone Encounter (Signed)
Social work Tax inspector called patient to remind him of his 2 appointments on December 1st.

## 2020-02-12 ENCOUNTER — Other Ambulatory Visit: Payer: Self-pay

## 2020-02-12 ENCOUNTER — Ambulatory Visit: Payer: Self-pay

## 2020-02-12 DIAGNOSIS — Z79899 Other long term (current) drug therapy: Secondary | ICD-10-CM

## 2020-02-12 NOTE — Progress Notes (Signed)
  Medication Management Clinic Visit Note  Patient: Benjamin Obrien MRN: [id number] Date of Birth: March 08, 1976 PCP: Rolm Gala, NP   Benjamin Obrien 44 y.o. male presents for a Medication therapy management via telephone visit today.  There were no vitals taken for this visit.  Patient Information   Past Medical History:  Diagnosis Date  . Pancreatitis       Past Surgical History:  Procedure Laterality Date  . HIP SURGERY    . INTRAMEDULLARY (IM) NAIL INTERTROCHANTERIC Left 08/24/2017   Procedure: INTRAMEDULLARY (IM) NAIL INTERTROCHANTRIC-SHORT AFFIXUS;  Surgeon: Kennedy Bucker, MD;  Location: [facility] ORS;  Service: Orthopedics;  Laterality: Left;  . TOOTH EXTRACTION      No family history on file.  New Diagnoses (since last visit): none  Family Support: Good  Lifestyle Diet: Breakfast, lunch, & dinner: fruit, [place], deer, fish, greens  Drinks: soda, milk, Hawaiian punch, Sunny D   Social History   Substance and Sexual Activity  Alcohol Use Yes   Comment: Saturdays      Social History   Tobacco Use  Smoking Status Current Every Day Smoker  . Packs/day: 1.00  . Types: Cigarettes  Smokeless Tobacco Never Used      Health Maintenance  Topic Date Due  . Hepatitis C Screening  Never done  . COVID-19 Vaccine (1) Never done  . TETANUS/TDAP  Never done  . INFLUENZA VACCINE  Never done  . HIV Screening  Completed   Outpatient Encounter Medications as of 02/12/2020  Medication Sig  . gabapentin (NEURONTIN) 300 MG capsule Take 1 capsule (300 mg total) by mouth 3 (three) times daily.  Marland Kitchen ibuprofen (ADVIL) 600 MG tablet Take 1 tablet (600 mg total) by mouth 2 (two) times daily.   No facility-administered encounter medications on file as of 02/12/2020.   Health Maintenance/Date Completed  Last ED visit: 06/04/19 Last Visit to PCP: 01/22/20 Next Visit to PCP: 02/19/20 Flu Vaccine: pt refused COVID-19 Vaccine: 1 dose around June   Assessment and  Plan:  Access/Adherence: Pt reports barriers paying heating and electricity bills - pt does not currently have electricity. Pt does no report issues with housing or food access. Pt does not have issues getting around town with transportation, but does have issues with transportation to neurologist in [place].   Leg numbness: Pt takes gabapentin 300 mg three times daily.   Pain: Pt takes ibuprofen 600 mg twice daily.  Follow up for Outreach MTM in 1 year   Reatha Armour, PharmD Pharmacy Resident  02/12/2020 11:44 AM

## 2020-02-18 ENCOUNTER — Ambulatory Visit: Payer: Self-pay | Admitting: Licensed Clinical Social Worker

## 2020-02-19 ENCOUNTER — Other Ambulatory Visit: Payer: Self-pay | Admitting: Gerontology

## 2020-02-19 ENCOUNTER — Other Ambulatory Visit: Payer: Self-pay

## 2020-02-19 ENCOUNTER — Ambulatory Visit: Payer: Self-pay | Admitting: Licensed Clinical Social Worker

## 2020-02-19 ENCOUNTER — Ambulatory Visit: Payer: Self-pay | Admitting: Gerontology

## 2020-02-19 ENCOUNTER — Encounter: Payer: Self-pay | Admitting: Gerontology

## 2020-02-19 VITALS — BP 106/74 | HR 100 | Resp 16

## 2020-02-19 DIAGNOSIS — M25552 Pain in left hip: Secondary | ICD-10-CM

## 2020-02-19 DIAGNOSIS — G8929 Other chronic pain: Secondary | ICD-10-CM

## 2020-02-19 DIAGNOSIS — R202 Paresthesia of skin: Secondary | ICD-10-CM

## 2020-02-19 DIAGNOSIS — R200 Anesthesia of skin: Secondary | ICD-10-CM

## 2020-02-19 DIAGNOSIS — F418 Other specified anxiety disorders: Secondary | ICD-10-CM

## 2020-02-19 MED ORDER — MELOXICAM 15 MG PO TABS: 15.0000 mg | ORAL_TABLET | Freq: Every day | ORAL | 0 refills | Status: DC

## 2020-02-19 MED ORDER — GABAPENTIN 400 MG PO CAPS: 400.0000 mg | ORAL_CAPSULE | Freq: Three times a day (TID) | ORAL | 0 refills | Status: DC

## 2020-02-19 NOTE — Progress Notes (Signed)
Established Patient Office Visit  Subjective:  Patient ID: Benjamin Obrien, male    DOB: 1975/07/12  Age: 44 y.o. MRN: [id number]  CC: No chief complaint on file.   HPI Benjamin Obrien presents for follow up of his chronic left hip pain that radiates to his legs. He states that the pain worsens everyday and nothing is helping. Denies alleviating factors, but walking and standing make the pain worse. He resumed his gabapentin after last visit (01/22/20), but reports no improvement in numbness and tingling. Reports that he has scheduled his neurology appointment for 03/06/20. No falls since last visit. He reports continued anxiety related to his inability to work, but states that he has applied for his disability and has been following up with Rhett Bannister for mental health in clinic. Patient offers no further complaint.   Past Medical History:  Diagnosis Date  . Pancreatitis     Past Surgical History:  Procedure Laterality Date  . HIP SURGERY    . INTRAMEDULLARY (IM) NAIL INTERTROCHANTERIC Left 08/24/2017   Procedure: INTRAMEDULLARY (IM) NAIL INTERTROCHANTRIC-SHORT AFFIXUS;  Surgeon: Kennedy Bucker, MD;  Location: [facility] ORS;  Service: Orthopedics;  Laterality: Left;  . TOOTH EXTRACTION      No family history on file.  Social History   Socioeconomic History  . Marital status: Single    Spouse name: Not on file  . Number of children: Not on file  . Years of education: Not on file  . Highest education level: Not on file  Occupational History  . Not on file  Tobacco Use  . Smoking status: Current Every Day Smoker    Packs/day: 0.50    Types: Cigarettes  . Smokeless tobacco: Never Used  Vaping Use  . Vaping Use: Never used  Substance and Sexual Activity  . Alcohol use: Yes    Alcohol/week: 2.0 standard drinks    Types: 2 Cans of beer per week    Comment: Saturdays  . Drug use: Not Currently  . Sexual activity: Not on file  Other Topics Concern  . Not on file  Social History  Narrative  . Not on file   Social Determinants of Health   Financial Resource Strain:   . Difficulty of Paying Living Expenses: Not on file  Food Insecurity:   . Worried About Programme researcher, broadcasting/film/video in the Last Year: Not on file  . Ran Out of Food in the Last Year: Not on file  Transportation Needs:   . Lack of Transportation (Medical): Not on file  . Lack of Transportation (Non-Medical): Not on file  Physical Activity:   . Days of Exercise per Week: Not on file  . Minutes of Exercise per Session: Not on file  Stress:   . Feeling of Stress : Not on file  Social Connections:   . Frequency of Communication with Friends and Family: Not on file  . Frequency of Social Gatherings with Friends and Family: Not on file  . Attends Religious Services: Not on file  . Active Member of Clubs or Organizations: Not on file  . Attends Banker Meetings: Not on file  . Marital Status: Not on file  Intimate Partner Violence:   . Fear of Current or Ex-Partner: Not on file  . Emotionally Abused: Not on file  . Physically Abused: Not on file  . Sexually Abused: Not on file    Outpatient Medications Prior to Visit  Medication Sig Dispense Refill  . gabapentin (NEURONTIN) 300 MG  capsule Take 1 capsule (300 mg total) by mouth 3 (three) times daily. 90 capsule 1  . ibuprofen (ADVIL) 600 MG tablet Take 1 tablet (600 mg total) by mouth 2 (two) times daily. 60 tablet 0   No facility-administered medications prior to visit.    No Known Allergies  ROS Review of Systems  Constitutional: Negative.   HENT: Negative.   Respiratory: Negative.   Cardiovascular: Negative.   Musculoskeletal: Positive for arthralgias and gait problem.       Chronic L hip pain  Psychiatric/Behavioral: The patient is nervous/anxious.       Objective:    Physical Exam Constitutional:      Appearance: Normal appearance.  Cardiovascular:     Rate and Rhythm: Normal rate and regular rhythm.     Heart sounds:  Normal heart sounds.  Pulmonary:     Effort: Pulmonary effort is normal.     Breath sounds: Normal breath sounds.  Abdominal:     General: Abdomen is flat. Bowel sounds are normal.     Palpations: Abdomen is soft.  Musculoskeletal:     Left hip: Decreased range of motion.  Skin:    General: Skin is warm and dry.  Neurological:     General: No focal deficit present.     Mental Status: He is alert and oriented to person, place, and time.  Psychiatric:        Mood and Affect: Mood normal.     BP 106/74   Pulse 100   Resp 16   SpO2 98%  Wt Readings from Last 3 Encounters:  01/22/20 147 lb 12.8 oz (67 kg)  09/19/19 140 lb (63.5 kg)  08/14/19 140 lb 12.8 oz (63.9 kg)     Health Maintenance Due  Topic Date Due  . Hepatitis C Screening  Never done  . COVID-19 Vaccine (1) Never done  . TETANUS/TDAP  Never done  . INFLUENZA VACCINE  Never done    There are no preventive care reminders to display for this patient.  Lab Results  Component Value Date   TSH 0.452 08/14/2019   Lab Results  Component Value Date   WBC 9.5 08/14/2019   HGB 15.7 08/14/2019   HCT 46.0 08/14/2019   MCV 92 08/14/2019   PLT 377 08/14/2019   Lab Results  Component Value Date   NA 140 08/14/2019   K 4.2 08/14/2019   CO2 24 08/14/2019   GLUCOSE 121 (H) 08/14/2019   BUN 9 08/14/2019   CREATININE 0.92 08/14/2019   BILITOT 0.3 08/14/2019   ALKPHOS 67 08/14/2019   AST 38 08/14/2019   ALT 25 08/14/2019   PROT 6.2 08/14/2019   ALBUMIN 4.2 08/14/2019   CALCIUM 9.8 08/14/2019   ANIONGAP 11 05/11/2019   No results found for: CHOL No results found for: HDL No results found for: LDLCALC No results found for: TRIG No results found for: [facility] Lab Results  Component Value Date   HGBA1C 5.1 08/14/2019      Assessment & Plan:   1. Chronic left hip pain Start meloxicam as prescribed. Stop taking ibuprofen. - meloxicam (MOBIC) 15 MG tablet; Take 1 tablet (15 mg total) by mouth daily.   Dispense: 30 tablet; Refill: 0  2. Anxiety about health Continue to follow up with [facility] Bann[facility].  He declines Behavioral Health referral, was advised to call the Crisis help line or Clinic for worsening symptoms.  3. Numbness and tingling - He will continue on gabapentin and was advised to  follow up with [facility]. He was advised to go to the ED with worsening symptoms. - gabapentin (NEURONTIN) 400 MG capsule; Take 1 capsule (400 mg total) by mouth 3 (three) times daily.  Dispense: 90 capsule; Refill: 0   Follow-up: Return in about 5 weeks (around 03/25/2020), or if symptoms worsen or fail to improve.    Kathlene November, Student-NP

## 2020-02-25 ENCOUNTER — Other Ambulatory Visit: Payer: Self-pay | Admitting: Gerontology

## 2020-02-25 DIAGNOSIS — F418 Other specified anxiety disorders: Secondary | ICD-10-CM

## 2020-02-25 MED ORDER — MIRTAZAPINE 15 MG PO TABS: 15.0000 mg | ORAL_TABLET | Freq: Every day | ORAL | 0 refills | Status: AC

## 2020-02-26 ENCOUNTER — Telehealth: Payer: Self-pay | Admitting: Licensed Clinical Social Worker

## 2020-02-26 ENCOUNTER — Ambulatory Visit: Payer: Self-pay | Admitting: Licensed Clinical Social Worker

## 2020-02-26 NOTE — Telephone Encounter (Signed)
Called patient regarding his appointment; could not leave message because his voicemail box was full.

## 2020-03-03 ENCOUNTER — Telehealth: Payer: Self-pay | Admitting: Licensed Clinical Social Worker

## 2020-03-03 NOTE — Telephone Encounter (Signed)
Called patient twice regarding his missed appointment with [facility] clinician. No answer and voicemail was full.

## 2020-03-06 ENCOUNTER — Other Ambulatory Visit: Payer: Self-pay

## 2020-03-06 ENCOUNTER — Encounter: Payer: Self-pay | Admitting: Neurology

## 2020-03-06 ENCOUNTER — Other Ambulatory Visit: Payer: Self-pay | Admitting: Neurology

## 2020-03-06 ENCOUNTER — Ambulatory Visit (INDEPENDENT_AMBULATORY_CARE_PROVIDER_SITE_OTHER): Payer: Self-pay | Admitting: Neurology

## 2020-03-06 VITALS — BP 133/78 | HR 98 | Ht 68.0 in | Wt 146.6 lb

## 2020-03-06 DIAGNOSIS — M542 Cervicalgia: Secondary | ICD-10-CM

## 2020-03-06 DIAGNOSIS — Z87828 Personal history of other (healed) physical injury and trauma: Secondary | ICD-10-CM

## 2020-03-06 DIAGNOSIS — R269 Unspecified abnormalities of gait and mobility: Secondary | ICD-10-CM | POA: Insufficient documentation

## 2020-03-06 MED ORDER — DULOXETINE HCL 60 MG PO CPEP: 60.0000 mg | ORAL_CAPSULE | Freq: Every day | ORAL | 12 refills | Status: DC

## 2020-03-06 NOTE — Progress Notes (Addendum)
Chief Complaint  Patient presents with  . New Patient (Initial Visit)    New room, alone. Internal referral from Reece Levy, NP (PCP) for numbness/tingling. 800lb water heater fell on him in 2019 while he was crouched down. He could not see it coming. Broke his hip.  Had surgery, did not do PT/OT after surgery d/t no insurance, he was going through a divorce at the time. Had a lot going on.  States ever since, his sx have progressed. He is in constant pain in hips/legs. Gabapentin/ibuprofen ineffective. He stopped these.    HISTORICAL  Benjamin Obrien is a 44 year old male, seen in request by his primary care nurse practitioner Eulogio Bear for evaluation of diffuse body achy pain, worsening gait abnormality, initial evaluation was on March 06, 2020.  I reviewed and summarized the referring note.  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    Since the injury, he was not able to go back to work, he continued to have gait abnormality, limited range of motion of left hip, there is also new problem began to develop gradually, he developed neck pain, radiating pain to bilateral shoulder, especially left arm, sometimes the whole left arm goes numb  He also complains of worsening low back pain, radiating pain to bilateral lower extremity, his leg give out underneath him, early morning, only in certain posturing, he felt shooting pain along his spine, electronic shooting sensation going through his lower extremity, he also complains of worsening urinary urgency, occasionally incontinence  He recently approved for charity care, was given a prescription of Mobic, has not tried it yet  He depend on his parents to support him, lost  the vehicle,  I personally reviewed MRI of lumbar spine in February 2021, no acute abnormality, 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  REVIEW OF SYSTEMS: Full 14 system review of systems performed and notable only for as above All other review of systems were negative.  ALLERGIES: No Known Allergies  HOME MEDICATIONS: Current Outpatient Medications  Medication Sig Dispense Refill  . meloxicam (MOBIC) 15 MG tablet Take 1 tablet (15 mg total) by mouth daily. (Patient not taking: Reported on 03/06/2020) 30 tablet 0  . mirtazapine (REMERON) 15 MG tablet Take 1 tablet (15 mg total) by mouth at bedtime. (Patient not taking: Reported on 03/06/2020) 30 tablet 0   No current facility-administered medications for this visit.    PAST MEDICAL HISTORY: Past Medical History:  Diagnosis Date  . Pancreatitis     PAST SURGICAL HISTORY: Past Surgical History:  Procedure Laterality Date  . HIP SURGERY    . INTRAMEDULLARY (IM) NAIL INTERTROCHANTERIC Left 08/24/2017   Procedure: INTRAMEDULLARY (IM) NAIL INTERTROCHANTRIC-SHORT AFFIXUS;  Surgeon: Kennedy Bucker, MD;  Location: [facility] ORS;  Service: Orthopedics;  Laterality: Left;  . TOOTH EXTRACTION      FAMILY HISTORY: History reviewed. No pertinent family history.  SOCIAL HISTORY: Social History   Socioeconomic History  . Marital status: Single    Spouse name: Not on file  . Number of children: Not on file  . Years of education: Not on file  . Highest education level: Not on file  Occupational History  . Not on file  Tobacco Use  . Smoking status: Current Every Day Smoker    Packs/day: 0.50    Types: Cigarettes  . Smokeless tobacco: Never Used  Vaping Use  . Vaping Use: Never used  Substance and Sexual Activity  . Alcohol use: Yes    Alcohol/week: 2.0 standard drinks    Types: 2 Cans of beer per week    Comment: 2 beers on the weekend  . Drug use: Not  Currently  . Sexual activity: Not on file  Other Topics Concern  . Not on file  Social History Narrative   Right handed   Soda daily   Social Determinants of Health   Financial Resource Strain: Not on file  Food Insecurity: Not on file  Transportation Needs: Not on file  Physical Activity: Not on file  Stress: Not on file  Social Connections: Not on file  Intimate Partner Violence: Not on file     PHYSICAL EXAM   Vitals:   03/06/20 0913  BP: 133/78  Pulse: 98  Weight: 146 lb 9.6 oz (66.5 kg)  Height: 5\' 8"  (1.727 m)   Not recorded     Body mass index is 22.29 kg/m.  PHYSICAL EXAMNIATION:  Gen: NAD, conversant, well nourised, well groomed                     Cardiovascular: Regular rate rhythm, no peripheral edema, warm, nontender. Eyes: Conjunctivae clear without exudates or hemorrhage Neck: Supple, no carotid bruits. Pulmonary: Clear to auscultation bilaterally   NEUROLOGICAL EXAM:  MENTAL STATUS: Speech:    Speech is normal; fluent and spontaneous with normal comprehension.  Cognition:     Orientation to time, place and person     Normal recent and remote memory     Normal Attention span and concentration     Normal Language, naming, repeating,spontaneous speech     Fund of knowledge   CRANIAL NERVES: CN II: Visual fields are full to confrontation. Pupils are round equal and briskly reactive to light. CN III, IV, VI: extraocular movement are normal. No ptosis. CN V: Facial sensation is intact to light touch CN VII: Face is symmetric with normal eye closure  CN VIII: Hearing is normal to causal conversation. CN IX, X: Phonation is normal. CN XI: Head turning and shoulder shrug are intact  MOTOR: Limited range of motion of left hip, there was no significant bilateral upper or lower extremity proximal and distal muscle weakness  REFLEXES: Reflexes are 2 and symmetric at the biceps, triceps, knees, and ankles. Plantar responses are  flexor.  SENSORY: Intact to light touch, pinprick and vibratory sensation are intact in fingers and toes.  COORDINATION: There is no trunk or limb dysmetria noted.  GAIT/STANCE: He needs push-up to get up from seated position, antalgic, dragging left leg   DIAGNOSTIC DATA (LABS, IMAGING, TESTING) - I reviewed patient records, labs, notes, testing and imaging myself where available.   ASSESSMENT AND PLAN  Benjamin Obrien is a 44 y.o. male   Neck pain, following injury in September 2019  MRI of the cervical spine to rule out cervical radiculopathy, spondylitic myelopathy  EMG nerve conduction study  Cymbalta 60 mg daily   October 2019, M.D. Ph.D.  [facility] [address], [address] [place], [place] Ph: [phone] Fax: [phone]  CC:  [phone], NP [address] [place],  [place]

## 2020-03-09 ENCOUNTER — Telehealth: Payer: Self-pay | Admitting: Neurology

## 2020-03-09 NOTE — Telephone Encounter (Signed)
self pay order sent to GI. They will reach out to the patient to schedule.  °

## 2020-03-10 ENCOUNTER — Other Ambulatory Visit: Payer: Self-pay

## 2020-03-10 NOTE — Telephone Encounter (Signed)
Patient has cone assistance exp 07/19/20 patient is scheduled at [facility] for 03/18/20 arrival time is 4:30 pm patient is aware of time and day. I also gave him their number of [phone] incase he needed to r/s.

## 2020-03-18 ENCOUNTER — Ambulatory Visit: Admission: RE | Admit: 2020-03-18 | Payer: Self-pay | Source: Ambulatory Visit

## 2020-03-30 ENCOUNTER — Ambulatory Visit: Admission: RE | Admit: 2020-03-30 | Payer: Self-pay | Source: Ambulatory Visit

## 2020-03-31 ENCOUNTER — Ambulatory Visit
Admission: RE | Admit: 2020-03-31 | Discharge: 2020-03-31 | Disposition: A | Discharge status: Home / Self Care | Payer: Self-pay | Source: Ambulatory Visit | Attending: Neurology | Admitting: Neurology

## 2020-03-31 ENCOUNTER — Other Ambulatory Visit: Payer: Self-pay

## 2020-03-31 DIAGNOSIS — M542 Cervicalgia: Secondary | ICD-10-CM | POA: Insufficient documentation

## 2020-03-31 IMAGING — MR MR CERVICAL SPINE W/O CM
5 series · 39 of 48 positions shown · non-contrast
Comparison: None.

CLINICAL DATA: Neck pain, injured in [WP].

EXAM:
MRI CERVICAL SPINE WITHOUT CONTRAST
TECHNIQUE: Multiplanar, multisequence MR imaging of the cervical spine was
performed. No intravenous contrast was administered.

[Series 5: T2 · sagittal · 3.0mm · 0.62mm/px · 8 of 15 slices shown (1 of 2)]
[im 1/15]
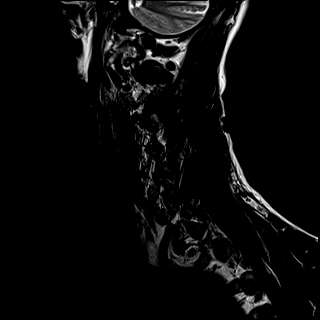
[im 3/15]
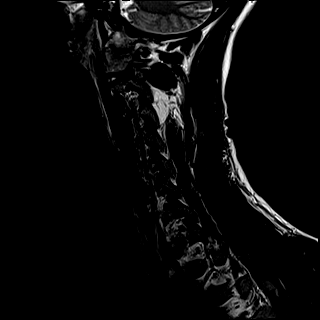
[im 5/15]
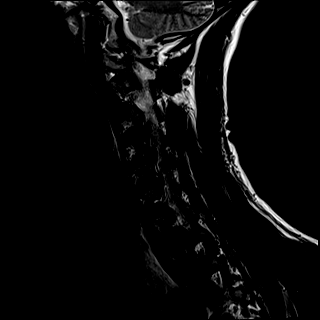
[im 7/15]
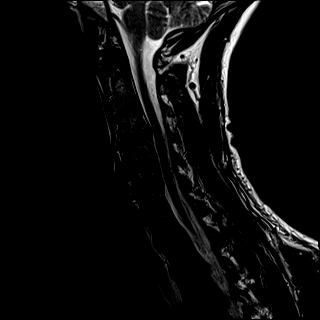
[im 9/15]
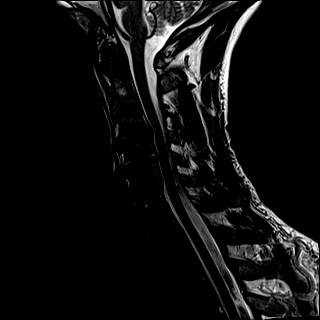
[im 11/15]
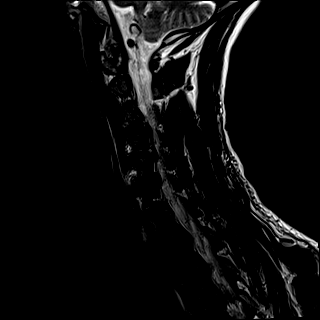
[im 13/15]
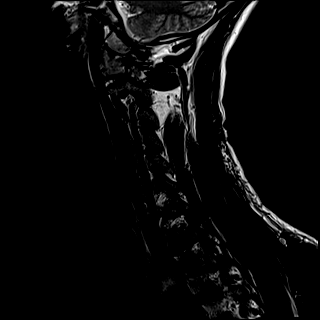
[im 15/15]
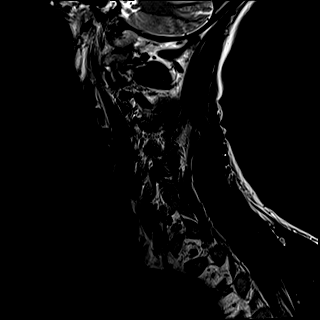

[Series 6: FLAIR · sagittal · 3.0mm · 0.78mm/px · 7 of 15 slices shown]
[im 1/15]
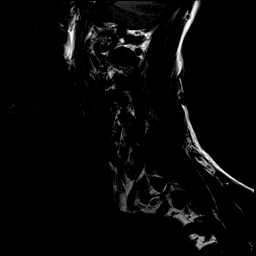
[im 3/15]
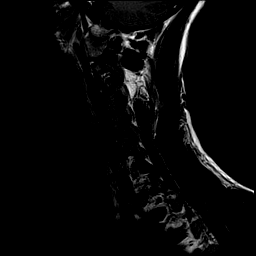
[im 5/15]
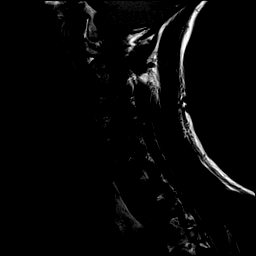
[im 8/15]
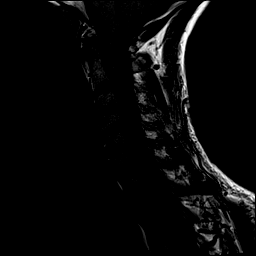
[im 10/15]
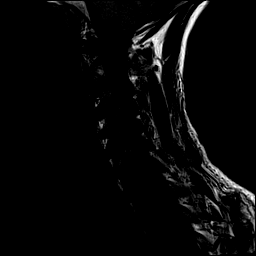
[im 12/15]
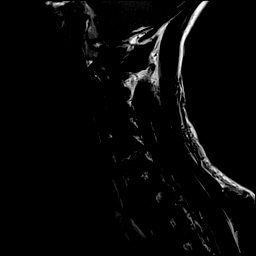
[im 15/15]
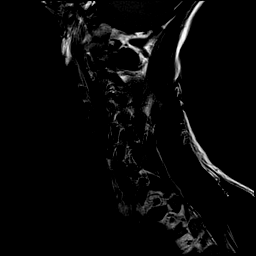

[Series 7: STIR · sagittal · 3.0mm · 0.62mm/px · 7 of 15 slices shown]
[im 1/15]
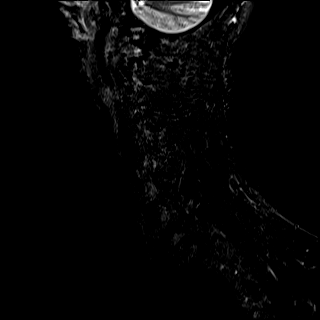
[im 3/15]
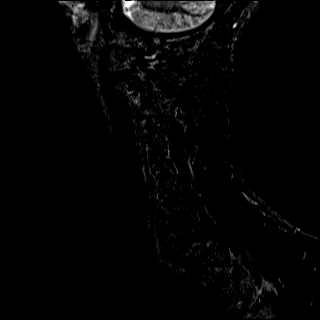
[im 5/15]
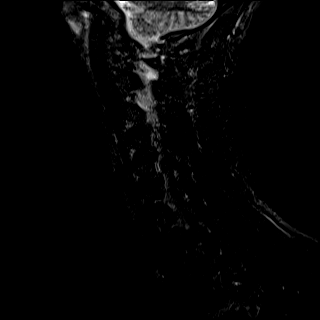
[im 8/15]
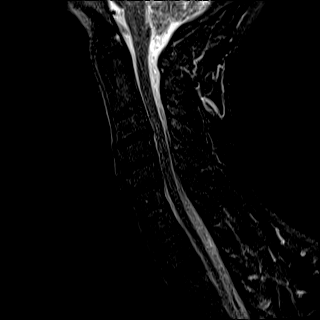
[im 10/15]
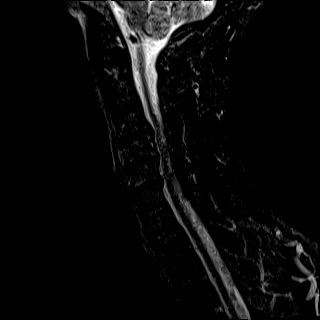
[im 12/15]
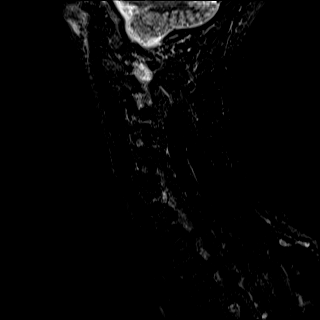
[im 15/15]
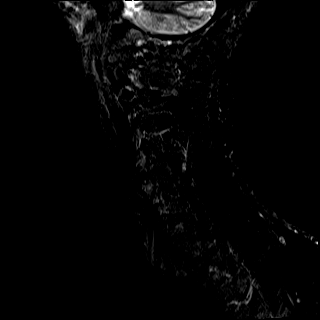

[Series 8: T2 · axial · 3.0mm · 0.70mm/px · z∈[-128,-38]mm · 9 of 26 slices shown (2 of 2)]
[im 1/26]
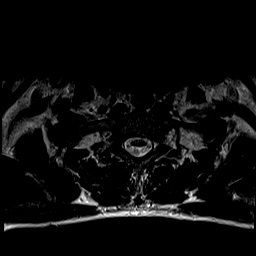
[im 5/26]
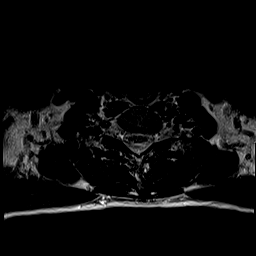
[im 7/26]
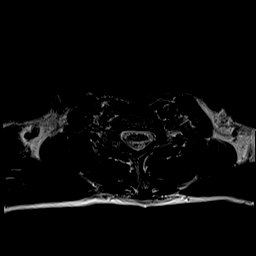
[im 12/26]
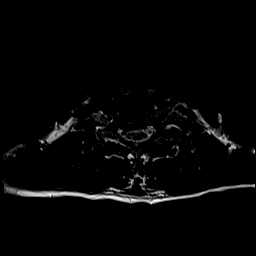
[im 14/26]
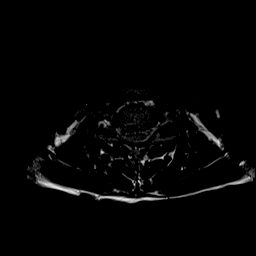
[im 19/26]
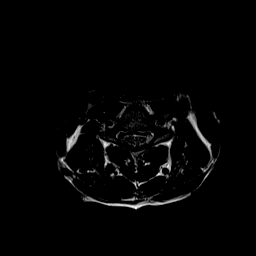
[im 21/26]
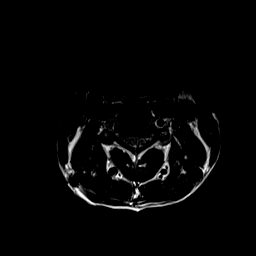
[im 23/26]
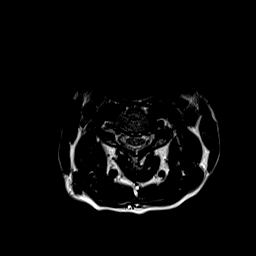
[im 26/26]
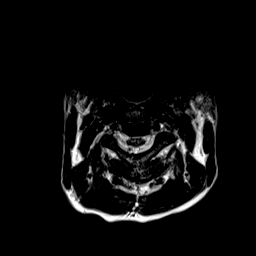

[Series 10: ax mpgr · axial · 3.0mm · 0.35mm/px · z∈[-128,-35]mm · 8 of 29 slices shown]
[im 1/29]
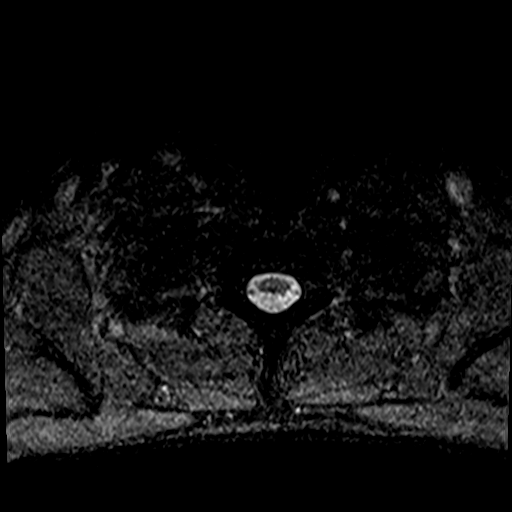
[im 5/29]
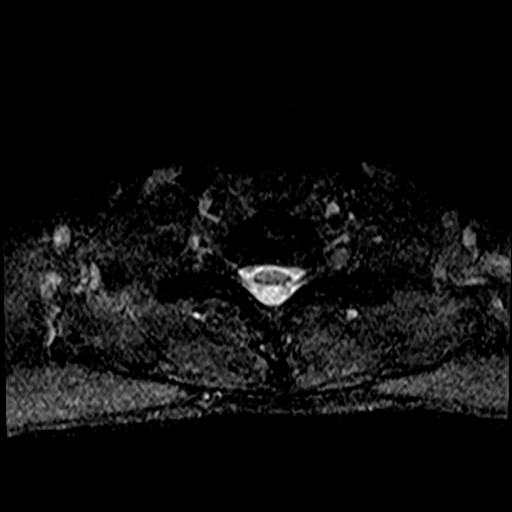
[im 9/29]
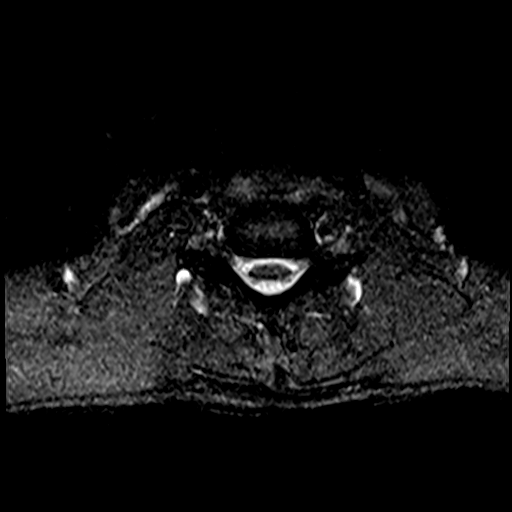
[im 13/29]
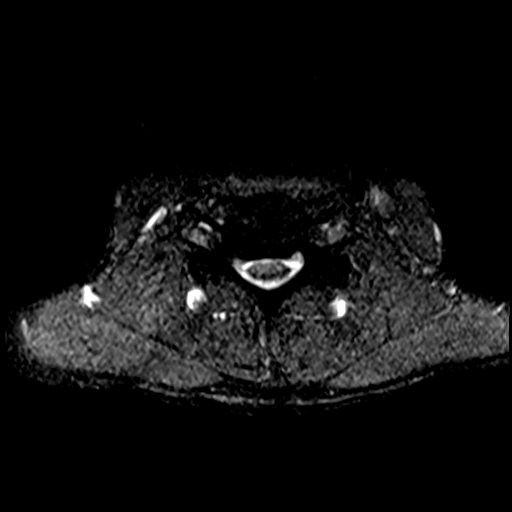
[im 16/29]
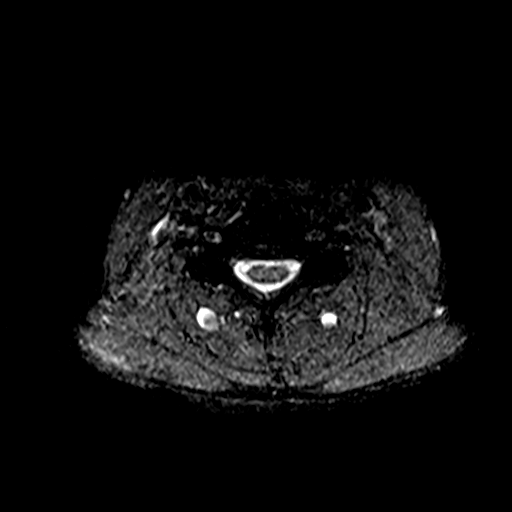
[im 20/29]
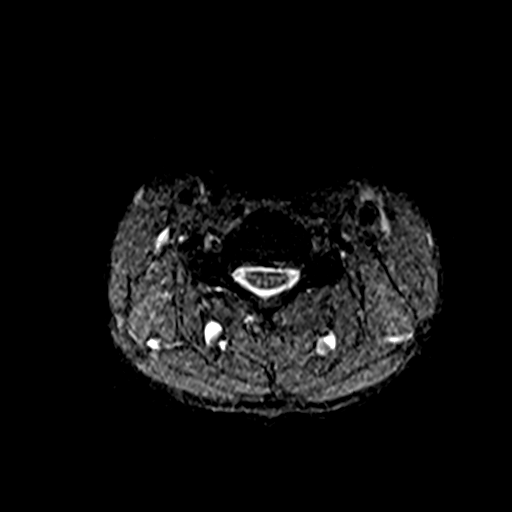
[im 24/29]
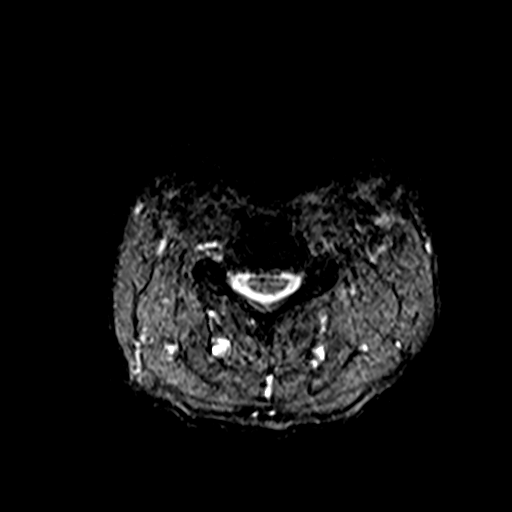
[im 29/29]
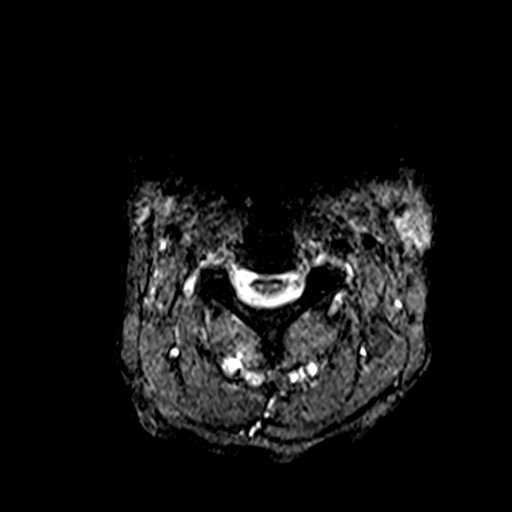

[39 of 48 positions shown; findings below may reference images not displayed]

FINDINGS: Alignment: Physiologic.

Vertebrae: No fracture, evidence of discitis, or bone lesion.

Cord: Normal signal and morphology.

Posterior Fossa, vertebral arteries, paraspinal tissues: Posterior
fossa demonstrates no focal abnormality. Vertebral artery flow voids
are maintained. Paraspinal soft tissues are unremarkable.

Disc levels:

Discs: Degenerative disease with mild disc height loss C4-5 and
C5-6.

C2-3: No significant disc bulge. No neural foraminal stenosis. No
central canal stenosis.

C3-4: Broad-based disc bulge. Bilateral uncovertebral degenerative
changes. Severe bilateral foraminal stenosis. Mild spinal stenosis.

C4-5: Broad-based disc bulge. Bilateral uncovertebral degenerative
changes. Severe bilateral foraminal stenosis. No central canal
stenosis.

C5-6: Broad-based disc bulge. Bilateral uncovertebral degenerative
changes. Severe right foraminal stenosis. Moderate left foraminal
stenosis. No central canal stenosis.

C6-7: No significant disc bulge. No neural foraminal stenosis. No
central canal stenosis.

C7-T1: No significant disc bulge. No neural foraminal stenosis. No
central canal stenosis.
IMPRESSION: Cervical spine spondylosis at C3-4, C4-5 and C5-6 as described
above.

No acute osseous injury of the cervical spine.

## 2020-04-01 ENCOUNTER — Telehealth: Payer: Self-pay | Admitting: Neurology

## 2020-04-01 NOTE — Telephone Encounter (Signed)
C3-4: Broad-based disc bulge. Bilateral uncovertebral degenerative changes. Severe bilateral foraminal stenosis. Mild spinal stenosis.  C4-5: Broad-based disc bulge. Bilateral uncovertebral degenerative changes. Severe bilateral foraminal stenosis. No central canal stenosis.  C5-6: Broad-based disc bulge. Bilateral uncovertebral degenerative changes. Severe right foraminal stenosis. Moderate left foraminal stenosis. No central canal stenosis.   Please call patient, MRI of cervical spine showed multilevel degenerative changes, most obvious at C3-4, C4-5, with evidence of bilateral foraminal stenosis, no evidence of spinal cord compression  Will review films with him at next follow-up visit

## 2020-04-01 NOTE — Telephone Encounter (Signed)
I called and spoke with patient and made him aware of his MRI results. He voiced understanding and appreciation and confirmed his NCS/EMG appt on 04/08/2020.

## 2020-04-08 ENCOUNTER — Encounter: Payer: Self-pay | Admitting: Neurology

## 2020-04-27 ENCOUNTER — Encounter: Payer: Self-pay | Admitting: Neurology

## 2020-04-27 ENCOUNTER — Telehealth: Payer: Self-pay | Admitting: *Deleted

## 2020-04-27 NOTE — Telephone Encounter (Signed)
No showed NCV/EMG. 

## 2020-04-27 NOTE — Telephone Encounter (Signed)
Patient arrived 35 minutes late to his appt. The front desk rescheduled him to 05/26/20.

## 2020-05-12 DIAGNOSIS — Z0271 Encounter for disability determination: Secondary | ICD-10-CM

## 2020-05-26 ENCOUNTER — Ambulatory Visit (INDEPENDENT_AMBULATORY_CARE_PROVIDER_SITE_OTHER): Payer: Self-pay | Admitting: Neurology

## 2020-05-26 ENCOUNTER — Other Ambulatory Visit: Payer: Self-pay | Admitting: Gerontology

## 2020-05-26 DIAGNOSIS — M5441 Lumbago with sciatica, right side: Secondary | ICD-10-CM | POA: Insufficient documentation

## 2020-05-26 DIAGNOSIS — M25552 Pain in left hip: Secondary | ICD-10-CM

## 2020-05-26 DIAGNOSIS — G8929 Other chronic pain: Secondary | ICD-10-CM

## 2020-05-26 DIAGNOSIS — R269 Unspecified abnormalities of gait and mobility: Secondary | ICD-10-CM

## 2020-05-26 DIAGNOSIS — Z87828 Personal history of other (healed) physical injury and trauma: Secondary | ICD-10-CM

## 2020-05-26 DIAGNOSIS — M542 Cervicalgia: Secondary | ICD-10-CM

## 2020-05-26 NOTE — Progress Notes (Signed)
No chief complaint on file.   HISTORICAL  Benjamin Obrien is a 45 year old male, seen in request by his primary care nurse practitioner Eulogio Bear for evaluation of diffuse body achy pain, worsening gait abnormality, initial evaluation was on March 06, 2020.  I reviewed and summarized the referring note.  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    Since the injury, he was not able to go back to work, he continued to have gait abnormality, limited range of motion of left hip, there is also new problem began to develop gradually, he developed neck pain, radiating pain to bilateral shoulder, especially left arm, sometimes the whole left arm goes numb  He also complains of worsening low back pain, radiating pain to bilateral lower extremity, his leg give out underneath him, early morning, only in certain posturing, he felt shooting pain along his spine, electronic shooting sensation going through his lower extremity, he also complains of worsening urinary urgency, occasionally incontinence  He recently approved for charity care, was given a prescription of Mobic, has not tried it yet  He depend on his parents to support him, lost the vehicle,  I personally reviewed MRI of lumbar spine in February 2021, no acute abnormality, 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  UPDATE May 26 2020: Patient continues to complains of low back pain, neck pain, radiating to left shoulder, right foot pain, gait abnormality  Tried Cymbalta with limited help, has never seen pain management in the past, lives at [facility], depend on his 38 years old father to drive  him, he denies bowel bladder incontinence  I personally reviewed MRI of cervical spine on March 31, 2020, multilevel cervical degenerative changes, at C3-4, mild canal stenosis, severe bilateral foraminal stenosis; C4-5, no central canal stenosis, severe bilateral foraminal stenosis; C5-6, no canal stenosis, severe right, moderate left foraminal stenosis.  EMG nerve conduction study today showed no evidence of large fiber peripheral neuropathy, evidence of chronic right lumbosacral radiculopathy, mainly involving right L 4, L5, likely a slight left cervical radiculopathy involving C7, there was no evidence of active process   REVIEW OF SYSTEMS: Full 14 system review of systems performed and notable only for as above All other review of systems were negative.  ALLERGIES: No Known Allergies  HOME MEDICATIONS: Current Outpatient Medications  Medication Sig Dispense Refill  . DULoxetine (CYMBALTA) 60 MG capsule Take 1 capsule (60 mg total) by mouth daily. 30 capsule 12  . meloxicam (MOBIC) 15 MG tablet Take 1 tablet (15 mg total) by mouth daily. (Patient not taking: Reported on 03/06/2020) 30 tablet 0  . mirtazapine (REMERON) 15 MG tablet Take 1 tablet (15 mg total) by mouth at bedtime. (Patient not taking: Reported on 03/06/2020) 30 tablet 0   No current facility-administered medications for this visit.    PAST MEDICAL HISTORY: Past Medical History:  Diagnosis Date  . Pancreatitis     PAST SURGICAL HISTORY: Past Surgical History:  Procedure Laterality Date  . HIP SURGERY    . INTRAMEDULLARY (IM) NAIL INTERTROCHANTERIC Left 08/24/2017   Procedure: INTRAMEDULLARY (IM) NAIL INTERTROCHANTRIC-SHORT AFFIXUS;  Surgeon: Kennedy Bucker, MD;  Location: [facility]  ORS;  Service: Orthopedics;  Laterality: Left;  . TOOTH EXTRACTION      FAMILY HISTORY: No family history on file.  SOCIAL HISTORY: Social History   Socioeconomic History  . Marital status: Single    Spouse name: Not on file  .  Number of children: Not on file  . Years of education: Not on file  . Highest education level: Not on file  Occupational History  . Not on file  Tobacco Use  . Smoking status: Current Every Day Smoker    Packs/day: 0.50    Types: Cigarettes  . Smokeless tobacco: Never Used  Vaping Use  . Vaping Use: Never used  Substance and Sexual Activity  . Alcohol use: Yes    Alcohol/week: 2.0 standard drinks    Types: 2 Cans of beer per week    Comment: 2 beers on the weekend  . Drug use: Not Currently  . Sexual activity: Not on file  Other Topics Concern  . Not on file  Social History Narrative   Right handed   Soda daily   Social Determinants of Health   Financial Resource Strain: Not on file  Food Insecurity: Not on file  Transportation Needs: Not on file  Physical Activity: Not on file  Stress: Not on file  Social Connections: Not on file  Intimate Partner Violence: Not on file     PHYSICAL EXAM   There were no vitals filed for this visit. Not recorded     There is no height or weight on file to calculate BMI.  PHYSICAL EXAMNIATION:  Gen: NAD, conversant, well nourised, well groomed           NEUROLOGICAL EXAM:  MENTAL STATUS: Speech/cognition: Awake, alert, oriented to history taking and casual conversation   CRANIAL NERVES: CN II: Visual fields are full to confrontation. Pupils are round equal and briskly reactive to light. CN III, IV, VI: extraocular movement are normal. No ptosis. CN V: Facial sensation is intact to light touch CN VII: Face is symmetric with normal eye closure  CN VIII: Hearing is normal to causal conversation. CN IX, X: Phonation is normal. CN XI: Head turning and shoulder shrug are intact  MOTOR: Limited range of motion of left hip, there was no significant bilateral upper or lower extremity proximal and distal muscle weakness, complains of right foot pain upon deep palpitation  REFLEXES: Reflexes are 1 and symmetric at the biceps,  triceps, knees, and ankles. Plantar responses are flexor.  SENSORY: Intact to light touch, pinprick and vibratory sensation are intact in fingers decreased vibratory sensation at bilateral toes  COORDINATION: There is no trunk or limb dysmetria noted.  GAIT/STANCE: He needs push-up to get up from seated position, antalgic, dragging left leg   DIAGNOSTIC DATA (LABS, IMAGING, TESTING) - I reviewed patient records, labs, notes, testing and imaging myself where available.   ASSESSMENT AND PLAN  JUDD MCCUBBIN is a 45 y.o. male   History of injury in September 2019, 800 pound heater fell on his head, status post left hip fracture, requiring internal fixation Chronic neck pain, low back pain  Evidence of multilevel cervical, and lumbar degenerative changes as evident by MRI  EMG nerve conduction study showed mild right L4-5 radiculopathy, and slight left C7 radiculopathy, no evidence of active process  His functional limitation are mainly limited by his pain,  Continue Cymbalta, as needed NSAIDs  Referral to pain clinic please refer him to  regional pain management please refer him to  hospital pain management  Levert Feinstein, M.D. Ph.D.  [facility] [address], [address] [place], [place] [address] Ph: [phone] Fax: [phone]  CC:  Rolm Gala, NP [address] [place],  [place] [address]

## 2020-05-26 NOTE — Progress Notes (Signed)
HISTORICAL  Benjamin Obrien is a 45 year old male, seen in request by his primary care nurse practitioner Eulogio Bear for evaluation of diffuse body achy pain, worsening gait abnormality, initial evaluation was on March 06, 2020.  I reviewed and summarized the referring note.  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    Since the injury, he was not able to go back to work, he continued to have gait abnormality, limited range of motion of left hip, there is also new problem began to develop gradually, he developed neck pain, radiating pain to bilateral shoulder, especially left arm, sometimes the whole left arm goes numb  He also complains of worsening low back pain, radiating pain to bilateral lower extremity, his leg give out underneath him, early morning, only in certain posturing, he felt shooting pain along his spine, electronic shooting sensation going through his lower extremity, he also complains of worsening urinary urgency, occasionally incontinence  He recently approved for charity care, was given a prescription of Mobic, has not tried it yet  He depend on his parents to support him, lost the vehicle,  I personally reviewed MRI of lumbar spine in February 2021, no acute abnormality, 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  Update May 26, 2020: He return for electrodiagnostic study today, which showed evidence of mild right lumbar radiculopathy meaning right L4-5 myotomes.  Mild left C7 radiculopathy  Personally reviewed MRI cervical spine January 2022: Multilevel degenerative changes, no evidence of cord compression, severe bilateral  foraminal stenosis at C3-4, C4-5,C5-6, severe on right, moderate on the left  MRI of the lumbar spine February 2021, severe right L4-5 neural foraminal stenosis, moderate L5-S1 facet arthrosis  He tried Cymbalta, without significant improvement, he continue complains of significant low back pain, neck pain, radiating pain to left arm, limiting his daily activity, denies bowel bladder incontinence  REVIEW OF SYSTEMS: Full 14 system review of systems performed and notable only for as above All other review of systems were negative.  ALLERGIES: No Known Allergies  HOME MEDICATIONS: Current Outpatient Medications  Medication Sig Dispense Refill  . DULoxetine (CYMBALTA) 60 MG capsule Take 1 capsule (60 mg total) by mouth daily. 30 capsule 12  . meloxicam (MOBIC) 15 MG tablet Take 1 tablet (15 mg total) by mouth daily. (Patient not taking: Reported on 03/06/2020) 30 tablet 0  . mirtazapine (REMERON) 15 MG tablet Take 1 tablet (15 mg total) by mouth at bedtime. (Patient not taking: Reported on 03/06/2020) 30 tablet 0   No current facility-administered medications for this visit.    PAST MEDICAL HISTORY: Past Medical History:  Diagnosis Date  . Pancreatitis     PAST SURGICAL HISTORY: Past Surgical History:  Procedure Laterality Date  . HIP SURGERY    . INTRAMEDULLARY (IM) NAIL INTERTROCHANTERIC Left 08/24/2017   Procedure: INTRAMEDULLARY (IM) NAIL INTERTROCHANTRIC-SHORT AFFIXUS;  Surgeon: Kennedy Bucker, MD;  Location: [facility] ORS;  Service: Orthopedics;  Laterality: Left;  . TOOTH EXTRACTION      FAMILY HISTORY: No family history on file.  SOCIAL HISTORY: Social History   Socioeconomic History  . Marital status: Single    Spouse name: Not on  file  . Number of children: Not on file  . Years of education: Not on file  . Highest education level: Not on file  Occupational History  . Not on file  Tobacco Use  . Smoking status: Current Every Day Smoker    Packs/day: 0.50    Types:  Cigarettes  . Smokeless tobacco: Never Used  Vaping Use  . Vaping Use: Never used  Substance and Sexual Activity  . Alcohol use: Yes    Alcohol/week: 2.0 standard drinks    Types: 2 Cans of beer per week    Comment: 2 beers on the weekend  . Drug use: Not Currently  . Sexual activity: Not on file  Other Topics Concern  . Not on file  Social History Narrative   Right handed   Soda daily   Social Determinants of Health   Financial Resource Strain: Not on file  Food Insecurity: Not on file  Transportation Needs: Not on file  Physical Activity: Not on file  Stress: Not on file  Social Connections: Not on file  Intimate Partner Violence: Not on file     PHYSICAL EXAM   There were no vitals filed for this visit. Not recorded     There is no height or weight on file to calculate BMI.  PHYSICAL EXAMNIATION:  Gen: NAD, conversant, well nourised, well groomed                     Cardiovascular: Regular rate rhythm, no peripheral edema, warm, nontender. Eyes: Conjunctivae clear without exudates or hemorrhage Neck: Supple, no carotid bruits. Pulmonary: Clear to auscultation bilaterally   NEUROLOGICAL EXAM:  MENTAL STATUS: Speech:    Speech is normal; fluent and spontaneous with normal comprehension.  Cognition:     Orientation to time, place and person     Normal recent and remote memory     Normal Attention span and concentration     Normal Language, naming, repeating,spontaneous speech     Fund of knowledge   CRANIAL NERVES: CN II: Visual fields are full to confrontation. Pupils are round equal and briskly reactive to light. CN III, IV, VI: extraocular movement are normal. No ptosis. CN V: Facial sensation is intact to light touch CN VII: Face is symmetric with normal eye closure  CN VIII: Hearing is normal to causal conversation. CN IX, X: Phonation is normal. CN XI: Head turning and shoulder shrug are intact  MOTOR: Limited range of motion of left hip,  there was no significant bilateral upper or lower extremity proximal and distal muscle weakness  REFLEXES: Reflexes are 2 and symmetric at the biceps, triceps, knees, and ankles. Plantar responses are flexor.  SENSORY: Intact to light touch, pinprick and vibratory sensation are intact in fingers and toes.  COORDINATION: There is no trunk or limb dysmetria noted.  GAIT/STANCE: He needs push-up to get up from seated position, antalgic, dragging left leg some, limited by pain   DIAGNOSTIC DATA (LABS, IMAGING, TESTING) - I reviewed patient records, labs, notes, testing and imaging myself where available.   ASSESSMENT AND PLAN  Benjamin Obrien is a 45 y.o. male   Neck pain, following injury in September 2019 Chronic low back pain, left hip pain Gait abnormality  MRI of the cervical spine showed multilevel degenerative changes, most obvious at C3-4, C4-5, C5-6, mild canal stenosis, no cord signal abnormality, no physical findings to suggest a cervical myelopathy,   MRI of lumbar spine also showed severe right L4-5  foraminal stenosis,  Less likely to be a surgical candidate, continue Cymbalta 60 mg daily  His limitation is mainly due to pain, will refer him to [place] [facility] pain management   Levert Feinstein, M.D. Ph.D.  [facility] [address] [place], [place] [address] Ph: [phone] Fax: [phone]  CC:  Rolm Gala, NP [address] [place],  [place] [address]

## 2020-05-26 NOTE — Procedures (Signed)
Full Name: Benjamin Obrien Gender: Male MRN #: [id number] Date of Birth: 06-16-1975    Visit Date: 05/26/2020 12:35 Age: 45 Years Examining Physician: Levert Feinstein, MD  Referring Physician: Levert Feinstein, MD History: 45 year old male complains of persistent left hip pain, history of injury, also neck pain, radiating pain to left upper extremity, low back pain, radiating to bilateral lower extremity  Summary of the test:  Nerve conduction study: Left sural and superficial peroneal sensory response were robust.  Right sural sensory response showed mildly decreased snap amplitude.  Right superficial peroneal sensory response showed mildly prolonged peak latency with moderately decreased snap amplitude.  Left median and ulnar sensory responses were normal.  Bilateral tibial, left peroneal to EDB motor responses were normal.  Right peroneal to EDB motor response showed significantly decreased CMAP amplitude.  Left median and ulnar motor responses were normal.  Electromyography: Selected needle examination of bilateral lower extremity muscles, left lumbosacral paraspinal muscles left upper extremity muscles were performed.  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  There was no significant abnormality at the rest of the muscle tested, patient was not able to tolerate more paraspinal muscle needle exam.  Conclusion: This is an abnormal study.  There is electrodiagnostic evidence of chronic right lumbosacral radiculopathy mainly involving the right L4-5 myotomes.  There is also evidence of chronic left C7 radiculopathy, there is no evidence of active process.   ------------------------------- Levert Feinstein M.D. PhD  [facility] [address] [place], [place] [address] Tel: [phone] Fax: [phone]  Verbal informed consent was obtained from the patient, patient was informed of potential risk  of procedure, including bruising, bleeding, hematoma formation, infection, muscle weakness, muscle pain, numbness, among others.        MNC    Nerve / Sites Muscle Latency Ref. Amplitude Ref. Rel Amp Segments Distance Velocity Ref. Area    ms ms mV mV %  cm m/s m/s mVms  L Median - APB     Wrist APB 3.0 ?4.4 11.3 ?4.0 100 Wrist - APB 7   33.3     Upper arm APB 7.4  10.7  95.4 Upper arm - Wrist 24 55 ?49 32.2  L Ulnar - ADM     Wrist ADM 2.4 ?3.3 9.9 ?6.0 100 Wrist - ADM 7   37.7     B.Elbow ADM 5.8  9.3  94.3 B.Elbow - Wrist 21 61 ?49 36.2     A.Elbow ADM 7.5  9.3  99.3 A.Elbow - B.Elbow 10 59 ?49 36.2  L Peroneal - EDB     Ankle EDB 3.8 ?6.5 4.3 ?2.0 100 Ankle - EDB 9   12.9     Fib head EDB 10.3  2.7  62.1 Fib head - Ankle 28 44 ?44 8.0     Pop fossa EDB 12.5  2.9  108 Pop fossa - Fib head 10 44 ?44 9.4         Pop fossa - Ankle      R Peroneal - EDB     Ankle EDB 5.9 ?6.5 0.4 ?2.0 100 Ankle - EDB 9   1.1     Fib head EDB 14.1  0.3  77.7 Fib head - Ankle 29 35 ?44 0.8     Pop fossa EDB 17.7  0.3  93.5 Pop fossa - Fib head 10 28 ?44 0.7  Pop fossa - Ankle      L Tibial - AH     Ankle AH 4.1 ?5.8 10.3 ?4.0 100 Ankle - AH 9   15.4     Pop fossa AH 13.4  7.1  69.1 Pop fossa - Ankle 38 41 ?41 12.9  R Tibial - AH     Ankle AH 4.1 ?5.8 4.1 ?4.0 100 Ankle - AH 9   12.2     Pop fossa AH 13.2  2.9  69.5 Pop fossa - Ankle 37 41 ?41 10.6                    SNC    Nerve / Sites Rec. Site Peak Lat Ref.  Amp Ref. Segments Distance    ms ms V V  cm  L Sural - Ankle (Calf)     Calf Ankle 3.4 ?4.4 15 ?6 Calf - Ankle 14  R Sural - Ankle (Calf)     Calf Ankle 4.4 ?4.4 3 ?6 Calf - Ankle 14  L Superficial peroneal - Ankle     Lat leg Ankle 4.2 ?4.4 12 ?6 Lat leg - Ankle 14  R Superficial peroneal - Ankle     Lat leg Ankle 5.1 ?4.4 3 ?6 Lat leg - Ankle 14  L Median - Orthodromic (Dig II, Mid palm)     Dig II Wrist 2.8 ?3.4 19 ?10 Dig II - Wrist 13  L Ulnar - Orthodromic, (Dig V,  Mid palm)     Dig V Wrist 2.8 ?3.1 9 ?5 Dig V - Wrist 3                 F  Wave    Nerve F Lat Ref.   ms ms  L Tibial - AH 54.1 ?56.0  R Tibial - AH 59.8 ?56.0  L Ulnar - ADM 27.9 ?32.0           EMG Summary Table    Spontaneous MUAP Recruitment  Muscle IA Fib PSW Fasc Other Amp Dur. Poly Pattern  L. Tibialis anterior Increased None None None _______ Increased Increased 1+ Reduced  L. Tibialis posterior Normal None None None _______ Normal Normal Normal Normal  L. Peroneus longus Normal None None None _______ Normal Normal Normal Normal  L. Gastrocnemius (Medial head) Normal None None None _______ Normal Normal Normal Normal  L. Vastus lateralis Normal None None None _______ Normal Normal Normal Normal  L. Lumbar paraspinals (low) Normal None None None _______ Normal Normal Normal Normal  L. Lumbar paraspinals (mid) Normal None None None _______ Normal Normal Normal Normal  R. Tibialis anterior Normal None None None _______ Normal Normal Normal Normal  R. Tibialis posterior Normal None None None _______ Normal Normal Normal Normal  R. Peroneus longus Normal None None None _______ Normal Normal Normal Normal  R. Gastrocnemius (Medial head) Normal None None None _______ Normal Normal Normal Normal  R. Vastus lateralis Normal None None None _______ Normal Normal Normal Normal  R. Lumbar paraspinals (low) Normal None None None _______ Normal Normal Normal Normal  R. Lumbar paraspinals (mid) Normal None None None _______ Normal Normal Normal Normal  L. First dorsal interosseous Normal None None None _______ Normal Normal Normal Normal  L. Pronator teres Normal None None None _______ Normal Normal Normal Normal  L. Brachioradialis Normal None None None _______ Normal Normal Normal Normal  L. Extensor digitorum communis Normal None None None _______ Normal Normal Normal Normal  L. Biceps brachii  Normal None None None _______ Normal Normal Normal Normal  L. Deltoid Normal None None None  _______ Normal Normal Normal Normal  L. Triceps brachii Normal None None None _______ Increased Increased 1+ Reduced

## 2020-06-03 ENCOUNTER — Ambulatory Visit: Payer: Self-pay | Admitting: Gerontology

## 2020-06-17 ENCOUNTER — Other Ambulatory Visit: Payer: Self-pay | Admitting: Gerontology

## 2020-06-22 ENCOUNTER — Other Ambulatory Visit: Payer: Self-pay

## 2020-06-25 ENCOUNTER — Ambulatory Visit: Payer: Self-pay | Admitting: Gerontology

## 2020-07-09 ENCOUNTER — Other Ambulatory Visit: Payer: Self-pay

## 2020-07-09 ENCOUNTER — Encounter: Payer: Self-pay | Admitting: Gerontology

## 2020-07-09 ENCOUNTER — Ambulatory Visit: Payer: Self-pay | Admitting: Gerontology

## 2020-07-09 VITALS — BP 119/81 | HR 100 | Temp 97.4°F | Resp 16 | Wt 154.2 lb

## 2020-07-09 DIAGNOSIS — G8929 Other chronic pain: Secondary | ICD-10-CM

## 2020-07-09 DIAGNOSIS — M5441 Lumbago with sciatica, right side: Secondary | ICD-10-CM

## 2020-07-09 NOTE — Patient Instructions (Signed)
Sciatica  Sciatica is pain, weakness, tingling, or loss of feeling (numbness) along the sciatic nerve. The sciatic nerve starts in the lower back and goes down the back of each leg. Sciatica usually goes away on its own or with treatment. Sometimes, sciatica may come back (recur). What are the causes? This condition happens when the sciatic nerve is pinched or has pressure put on it. This may be the result of:  A disk in between the bones of the spine bulging out too far (herniated disk).  Changes in the spinal disks that occur with aging.  A condition that affects a muscle in the butt.  Extra bone growth near the sciatic nerve.  A break (fracture) of the area between your hip bones (pelvis).  Pregnancy.  Tumor. This is rare. What increases the risk? You are more likely to develop this condition if you:  Play sports that put pressure or stress on the spine.  Have poor strength and ease of movement (flexibility).  Have had a back injury in the past.  Have had back surgery.  Sit for long periods of time.  Do activities that involve bending or lifting over and over again.  Are very overweight (obese). What are the signs or symptoms? Symptoms can vary from mild to very bad. They may include:  Any of these problems in the lower back, leg, hip, or butt: ? Mild tingling, loss of feeling, or dull aches. ? Burning sensations. ? Sharp pains.  Loss of feeling in the back of the calf or the sole of the foot.  Leg weakness.  Very bad back pain that makes it hard to move. These symptoms may get worse when you cough, sneeze, or laugh. They may also get worse when you sit or stand for long periods of time. How is this treated? This condition often gets better without any treatment. However, treatment may include:  Changing or cutting back on physical activity when you have pain.  Doing exercises and stretching.  Putting ice or heat on the affected area.  Medicines that  help: ? To relieve pain and swelling. ? To relax your muscles.  Shots (injections) of medicines that help to relieve pain, irritation, and swelling.  Surgery. Follow these instructions at home: Medicines  Take over-the-counter and prescription medicines only as told by your doctor.  Ask your doctor if the medicine prescribed to you: ? Requires you to avoid driving or using heavy machinery. ? Can cause trouble pooping (constipation). You may need to take these steps to prevent or treat trouble pooping:  Drink enough fluids to keep your pee (urine) pale yellow.  Take over-the-counter or prescription medicines.  Eat foods that are high in fiber. These include beans, whole grains, and fresh fruits and vegetables.  Limit foods that are high in fat and sugar. These include fried or sweet foods. Managing pain  If told, put ice on the affected area. ? Put ice in a plastic bag. ? Place a towel between your skin and the bag. ? Leave the ice on for 20 minutes, 2-3 times a day.  If told, put heat on the affected area. Use the heat source that your doctor tells you to use, such as a moist heat pack or a heating pad. ? Place a towel between your skin and the heat source. ? Leave the heat on for 20-30 minutes. ? Remove the heat if your skin turns bright red. This is very important if you are unable to feel pain,  heat, or cold. You may have a greater risk of getting burned.      Activity  Return to your normal activities as told by your doctor. Ask your doctor what activities are safe for you.  Avoid activities that make your symptoms worse.  Take short rests during the day. ? When you rest for a long time, do some physical activity or stretching between periods of rest. ? Avoid sitting for a long time without moving. Get up and move around at least one time each hour.  Exercise and stretch regularly, as told by your doctor.  Do not lift anything that is heavier than 10 lb (4.5 kg)  while you have symptoms of sciatica. ? Avoid lifting heavy things even when you do not have symptoms. ? Avoid lifting heavy things over and over.  When you lift objects, always lift in a way that is safe for your body. To do this, you should: ? Bend your knees. ? Keep the object close to your body. ? Avoid twisting.   General instructions  Stay at a healthy weight.  Wear comfortable shoes that support your feet. Avoid wearing high heels.  Avoid sleeping on a mattress that is too soft or too hard. You might have less pain if you sleep on a mattress that is firm enough to support your back.  Keep all follow-up visits as told by your doctor. This is important. Contact a doctor if:  You have pain that: ? Wakes you up when you are sleeping. ? Gets worse when you lie down. ? Is worse than the pain you have had in the past. ? Lasts longer than 4 weeks.  You lose weight without trying. Get help right away if:  You cannot control when you pee (urinate) or poop (have a bowel movement).  You have weakness in any of these areas and it gets worse: ? Lower back. ? The area between your hip bones. ? Butt. ? Legs.  You have redness or swelling of your back.  You have a burning feeling when you pee. Summary  Sciatica is pain, weakness, tingling, or loss of feeling (numbness) along the sciatic nerve.  This condition happens when the sciatic nerve is pinched or has pressure put on it.  Sciatica can cause pain, tingling, or loss of feeling (numbness) in the lower back, legs, hips, and butt.  Treatment often includes rest, exercise, medicines, and putting ice or heat on the affected area. This information is not intended to replace advice given to you by your health care provider. Make sure you discuss any questions you have with your health care provider. Document Revised: 03/26/2018 Document Reviewed: 03/26/2018 Elsevier Patient Education  2021 [employer] Hip Pain The hip is the  joint between the upper legs and the lower pelvis. The bones, cartilage, tendons, and muscles of your hip joint support your body and allow you to move around. Hip pain can range from a minor ache to severe pain in one or both of your hips. The pain may be felt on the inside of the hip joint near the groin, or on the outside near the buttocks and upper thigh. You may also have swelling or stiffness in your hip area. Follow these instructions at home: Managing pain, stiffness, and swelling  If directed, put ice on the painful area. To do this: ? Put ice in a plastic bag. ? Place a towel between your skin and the bag. ? Leave the ice on for 20  minutes, 2-3 times a day.  If directed, apply heat to the affected area as often as told by your health care provider. Use the heat source that your health care provider recommends, such as a moist heat pack or a heating pad. ? Place a towel between your skin and the heat source. ? Leave the heat on for 20-30 minutes. ? Remove the heat if your skin turns bright red. This is especially important if you are unable to feel pain, heat, or cold. You may have a greater risk of getting burned.      Activity  Do exercises as told by your health care provider.  Avoid activities that cause pain. General instructions  Take over-the-counter and prescription medicines only as told by your health care provider.  Keep a journal of your symptoms. Write down: ? How often you have hip pain. ? The location of your pain. ? What the pain feels like. ? What makes the pain worse.  Sleep with a pillow between your legs on your most comfortable side.  Keep all follow-up visits as told by your health care provider. This is important.   Contact a health care provider if:  You cannot put weight on your leg.  Your pain or swelling continues or gets worse after one week.  It gets harder to walk.  You have a fever. Get help right away if:  You fall.  You have a  sudden increase in pain and swelling in your hip.  Your hip is red or swollen or very tender to touch. Summary  Hip pain can range from a minor ache to severe pain in one or both of your hips.  The pain may be felt on the inside of the hip joint near the groin, or on the outside near the buttocks and upper thigh.  Avoid activities that cause pain.  Write down how often you have hip pain, the location of the pain, what makes it worse, and what it feels like. This information is not intended to replace advice given to you by your health care provider. Make sure you discuss any questions you have with your health care provider. Document Revised: 07/23/2018 Document Reviewed: 07/23/2018 Elsevier Patient Education  2021 [employer].

## 2020-07-09 NOTE — Progress Notes (Signed)
Established Patient Office Visit  Subjective:  Patient ID: GARRELL FLAGG, male    DOB: 1975-09-03  Age: 45 y.o. MRN: [id number]  CC: No chief complaint on file.   HPI NATALE THOMA is a 45 year old male who presents for follow up chronic back and hip pain. He is followed by neurology and has undergone MRI and EMG nerve conduction studies. He was noted to have cervical and lumbar degenerative changes and L4-5, C7 radiculopathy. He was prescribed Cymbalta and PRN NSAIDs. At his previous appointment in March, he was referred to pain management clinic; however, they are unable to see him due to patient's history. Today, he says his pain persists as well as his weakness, primarily in his right leg. He also reports continued pain in his left hip after past traumatic work accident and subsequent surgery. He reports occasional urinary incontinence, no bowel incontinence, and no saddle anesthesia. He does have numbness to his right lower extremity. He reports his pain and weakness negatively impacts his ability to ambulate. His left hip causes discomfort while laying down and getting up from a seated/lying position. He reports a popping sensation and that the hip "bulges". He continues to take his gabapentin and Cymbalta but says they only provide minimal relief and the Cymbalta makes him groggy. He presents today with desire to be referred to [facility] neurology for further evaluation, as he has been approved for charity care with them. Overall, he is concerned about his worsening back pain and leg weakness and offers no further complaints.   Past Medical History:  Diagnosis Date  . Pancreatitis     Past Surgical History:  Procedure Laterality Date  . HIP SURGERY    . INTRAMEDULLARY (IM) NAIL INTERTROCHANTERIC Left 08/24/2017   Procedure: INTRAMEDULLARY (IM) NAIL INTERTROCHANTRIC-SHORT AFFIXUS;  Surgeon: Kennedy Bucker, MD;  Location: [facility] ORS;  Service: Orthopedics;  Laterality: Left;  . TOOTH EXTRACTION       No family history on file.  Social History   Socioeconomic History  . Marital status: Single    Spouse name: Not on file  . Number of children: Not on file  . Years of education: Not on file  . Highest education level: Not on file  Occupational History  . Not on file  Tobacco Use  . Smoking status: Current Every Day Smoker    Packs/day: 0.50    Types: Cigarettes  . Smokeless tobacco: Never Used  Vaping Use  . Vaping Use: Never used  Substance and Sexual Activity  . Alcohol use: Yes    Alcohol/week: 2.0 standard drinks    Types: 2 Cans of beer per week    Comment: 2 beers on the weekend  . Drug use: Not Currently  . Sexual activity: Not on file  Other Topics Concern  . Not on file  Social History Narrative   Right handed   Soda daily   Social Determinants of Health   Financial Resource Strain: Not on file  Food Insecurity: Not on file  Transportation Needs: Not on file  Physical Activity: Not on file  Stress: Not on file  Social Connections: Not on file  Intimate Partner Violence: Not on file    Outpatient Medications Prior to Visit  Medication Sig Dispense Refill  . amoxicillin (AMOXIL) 500 MG capsule TAKE ONE CAPSULE BY MOUTH EVERY 8 HOURS FOR 7 DAYS 21 capsule 0  . DULoxetine (CYMBALTA) 60 MG capsule TAKE ONE CAPSULE BY MOUTH EVERY DAY 30 capsule 12  .  meloxicam (MOBIC) 15 MG tablet TAKE ONE TABLET BY MOUTH EVERY DAY (Patient not taking: Reported on 03/06/2020) 30 tablet 0  . mirtazapine (REMERON) 15 MG tablet Take 1 tablet (15 mg total) by mouth at bedtime. (Patient not taking: Reported on 03/06/2020) 30 tablet 0   No facility-administered medications prior to visit.    No Known Allergies  ROS Review of Systems  Constitutional: Negative.   Respiratory: Negative.   Cardiovascular: Negative.  Negative for chest pain, palpitations and leg swelling.  Gastrointestinal: Negative.  Negative for abdominal pain and blood in stool.  Genitourinary: Positive  for urgency. Negative for difficulty urinating, dysuria, flank pain and frequency.       Occasional urgency and incontinence   Musculoskeletal: Positive for arthralgias, back pain, myalgias and neck pain.  Skin: Negative.   Neurological: Positive for weakness and numbness. Negative for dizziness and light-headedness.  Psychiatric/Behavioral: Negative.       Objective:    Physical Exam Constitutional:      General: He is not in acute distress.    Appearance: Normal appearance. He is normal weight. He is not toxic-appearing.  Eyes:     Extraocular Movements: Extraocular movements intact.     Conjunctiva/sclera: Conjunctivae normal.     Pupils: Pupils are equal, round, and reactive to light.  Cardiovascular:     Rate and Rhythm: Normal rate and regular rhythm.     Pulses: Normal pulses.     Heart sounds: Normal heart sounds.  Pulmonary:     Effort: Pulmonary effort is normal.     Breath sounds: Normal breath sounds.  Abdominal:     General: Abdomen is flat. Bowel sounds are normal. There is no distension.     Palpations: Abdomen is soft.     Tenderness: There is no abdominal tenderness.  Musculoskeletal:     Cervical back: Normal range of motion and neck supple. No tenderness.     Lumbar back: Decreased range of motion.     Right hip: Decreased strength.     Left hip: Deformity and tenderness present.     Right foot: No foot drop.     Left foot: No foot drop.  Skin:    General: Skin is warm and dry.     Capillary Refill: Capillary refill takes less than 2 seconds.  Neurological:     Mental Status: He is alert and oriented to person, place, and time. Mental status is at baseline.     Motor: Weakness (RLE ) present. No atrophy or abnormal muscle tone.     Gait: Gait is intact.  Psychiatric:        Mood and Affect: Mood normal.        Behavior: Behavior normal.     There were no vitals taken for this visit. Wt Readings from Last 3 Encounters:  03/06/20 146 lb 9.6 oz  (66.5 kg)  01/22/20 147 lb 12.8 oz (67 kg)  09/19/19 140 lb (63.5 kg)     Health Maintenance Due  Topic Date Due  . Hepatitis C Screening  Never done  . COVID-19 Vaccine (1) Never done  . TETANUS/TDAP  Never done  . COLONOSCOPY (Pts 45-12yrs Insurance coverage will need to be confirmed)  Never done    There are no preventive care reminders to display for this patient.  Lab Results  Component Value Date   TSH 0.452 08/14/2019   Lab Results  Component Value Date   WBC 9.5 08/14/2019   HGB 15.7 08/14/2019  HCT 46.0 08/14/2019   MCV 92 08/14/2019   PLT 377 08/14/2019   Lab Results  Component Value Date   NA 140 08/14/2019   K 4.2 08/14/2019   CO2 24 08/14/2019   GLUCOSE 121 (H) 08/14/2019   BUN 9 08/14/2019   CREATININE 0.92 08/14/2019   BILITOT 0.3 08/14/2019   ALKPHOS 67 08/14/2019   AST 38 08/14/2019   ALT 25 08/14/2019   PROT 6.2 08/14/2019   ALBUMIN 4.2 08/14/2019   CALCIUM 9.8 08/14/2019   ANIONGAP 11 05/11/2019   No results found for: CHOL No results found for: HDL No results found for: LDLCALC No results found for: TRIG No results found for: [facility] Lab Results  Component Value Date   HGBA1C 5.1 08/14/2019      Assessment & Plan:   1. Chronic left hip pain Continue current medication regimen until evaluation by ortho. Referral sent to [facility] orthopedic surgery for further evaluation and management.  - Ambulatory referral to Orthopedic Surgery  2. Low back pain with right-sided sciatica, unspecified back pain laterality, unspecified chronicity Continue current medication regimen until further eval completed. Notify of any abdominal discomfort or GI bleed. Pt has been followed and evaluated by [facility] Neurology. Requested to be seen by [facility] Neurology for second opinion and further management. Advised to go to the ED with worsening symptoms such as bowel/bladder incontinence and saddle anesthesia.  - Ambulatory referral to Neurology    Follow-up:  Return in 3 months (10/08/20), or if symptoms worsen or do not improve.   Noemi Chapel, RN, BSN, FNP-S

## 2020-07-20 DIAGNOSIS — Z0289 Encounter for other administrative examinations: Secondary | ICD-10-CM

## 2020-08-06 ENCOUNTER — Other Ambulatory Visit: Payer: Self-pay

## 2020-08-06 ENCOUNTER — Ambulatory Visit: Payer: Self-pay | Admitting: Gerontology

## 2020-08-06 VITALS — BP 122/65 | HR 104 | Temp 97.0°F | Resp 16 | Ht 67.0 in | Wt 156.7 lb

## 2020-08-06 DIAGNOSIS — R200 Anesthesia of skin: Secondary | ICD-10-CM

## 2020-08-06 DIAGNOSIS — F418 Other specified anxiety disorders: Secondary | ICD-10-CM

## 2020-08-06 DIAGNOSIS — R000 Tachycardia, unspecified: Secondary | ICD-10-CM

## 2020-08-06 DIAGNOSIS — M25552 Pain in left hip: Secondary | ICD-10-CM

## 2020-08-06 DIAGNOSIS — G8929 Other chronic pain: Secondary | ICD-10-CM

## 2020-08-06 MED ORDER — GABAPENTIN 400 MG PO CAPS
400.0000 mg | ORAL_CAPSULE | Freq: Three times a day (TID) | ORAL | 0 refills | Status: AC
  Filled 2020-08-06: qty 42, 14d supply, fill #0

## 2020-08-06 NOTE — Patient Instructions (Signed)
Sinus Tachycardia  Sinus tachycardia is a kind of fast heartbeat. In sinus tachycardia, the heart beats more than 100 times a minute. Sinus tachycardia starts in a part of the heart called the sinus node. Sinus tachycardia may be harmless, or it may be a sign of a serious condition. What are the causes? This condition may be caused by:  Exercise or exertion.  A fever.  Pain.  Loss of body fluids (dehydration).  Severe bleeding (hemorrhage).  Anxiety and stress.  Certain substances, including: ? Alcohol. ? Caffeine. ? Tobacco and nicotine products. ? Cold medicines. ? Illegal drugs.  Medical conditions including: ? Heart disease. ? An infection. ? An overactive thyroid (hyperthyroidism). ? A lack of red blood cells (anemia). What are the signs or symptoms? Symptoms of this condition include:  A feeling that the heart is beating quickly (palpitations).  Suddenly noticing your heartbeat (cardiac awareness).  Dizziness.  Tiredness (fatigue).  Shortness of breath.  Chest pain.  Nausea.  Fainting. How is this diagnosed? This condition is diagnosed with:  A physical exam.  Other tests, such as: ? Blood tests. ? An electrocardiogram (ECG). This test measures the electrical activity of the heart. ? Ambulatory cardiac monitor. This records your heartbeats for 24 hours or more. You may be referred to a heart specialist (cardiologist). How is this treated? Treatment for this condition depends on the cause or the underlying condition. Treatment may involve:  Treating the underlying condition.  Taking new medicines or changing your current medicines as told by your health care provider.  Making changes to your diet or lifestyle. Follow these instructions at home: Lifestyle  Do not use any products that contain nicotine or tobacco, such as cigarettes and e-cigarettes. If you need help quitting, ask your health care provider.  Do not use illegal drugs, such as  cocaine.  Learn relaxation methods to help you when you get stressed or anxious. These include deep breathing.  Avoid caffeine or other stimulants.   Alcohol use  Do not drink alcohol if: ? Your health care provider tells you not to drink. ? You are pregnant, may be pregnant, or are planning to become pregnant.  If you drink alcohol, limit how much you have: ? 0-1 drink a day for women. ? 0-2 drinks a day for men.  Be aware of how much alcohol is in your drink. In the [place] one drink equals one typical bottle of beer (12 oz), one-half glass of wine (5 oz), or one shot of hard liquor (1 oz).   General instructions  Drink enough fluids to keep your urine pale yellow.  Take over-the-counter and prescription medicines only as told by your health care provider.  Keep all follow-up visits as told by your health care provider. This is important. Contact a health care provider if you have:  A fever.  Vomiting or diarrhea that does not go away. Get help right away if you:  Have pain in your chest, upper arms, jaw, or neck.  Become weak or dizzy.  Feel faint.  Have palpitations that do not go away. Summary  In sinus tachycardia, the heart beats more than 100 times a minute.  Sinus tachycardia may be harmless, or it may be a sign of a serious condition.  Treatment for this condition depends on the cause or the underlying condition.  Get help right away if you have pain in your chest, upper arms, jaw, or neck. This information is not intended to replace advice given to   you by your health care provider. Make sure you discuss any questions you have with your health care provider. Document Revised: 04/26/2017 Document Reviewed: 04/26/2017 Elsevier Patient Education  2021 [employer] Inc[employer]  

## 2020-08-06 NOTE — Progress Notes (Signed)
Established Patient Office Visit  Subjective:  Patient ID: Benjamin Obrien, male    DOB: Feb 24, 1976  Age: 45 y.o. MRN: [id number]  CC:  Chief Complaint  Patient presents with  . Follow-up  . neurology referral    HPI Benjamin Obrien is a 45 year old male who presents for follow up chronic intermittent neuropathy that radiates from his lower back to his right leg. He states that his right foot is numb all the time.  He is followed by neurology and has undergone MRI and EMG nerve conduction studies. He was noted to have cervical and lumbar degenerative changes and L4-5, C7 radiculopathy. He was prescribed Cymbalta and PRN NSAIDs. Today, he continues to experience persistent pain as well as his weakness, primarily in his right leg. He also reports continued pain in his left hip after past traumatic work accident and subsequent surgery with metal implant to his left hip. He reports occasional urinary incontinence, no bowel incontinence, nor saddle anesthesia. He reports his pain and weakness negatively impacts his ability to ambulate. His left hip causes discomfort while laying down and getting up from a seated/lying position. He reports a popping sensation and that the hip "bulges". He continues to take his gabapentin and Cymbalta but says they only provide minimal relief and the Cymbalta makes him groggy. He presents today with desire to referred to [facility] in [place] for further evaluation. His heart rate was elevated during visit and he states that he worries about his right lower extremity neuropathy. He denies chest pain, palpitation and shortness of breath. He states that his mood is dysphoric , denies suicidal nor homicidal ideation and requests Behavioral health consult. he is concerned about his worsening back pain and leg weakness and offers no further complaints.   Past Medical History:  Diagnosis Date  . Depression   . Neuropathy   . Pancreatitis     Past Surgical  History:  Procedure Laterality Date  . HIP SURGERY    . INTRAMEDULLARY (IM) NAIL INTERTROCHANTERIC Left 08/24/2017   Procedure: INTRAMEDULLARY (IM) NAIL INTERTROCHANTRIC-SHORT AFFIXUS;  Surgeon: Kennedy Bucker, MD;  Location: [facility] ORS;  Service: Orthopedics;  Laterality: Left;  . TOOTH EXTRACTION      Family History  Problem Relation Age of Onset  . Hypertension Mother   . Cancer Father        stomach  . Alzheimer's disease Maternal Grandmother   . Alzheimer's disease Paternal Grandmother     Social History   Socioeconomic History  . Marital status: Single    Spouse name: Not on file  . Number of children: Not on file  . Years of education: Not on file  . Highest education level: Not on file  Occupational History  . Not on file  Tobacco Use  . Smoking status: Current Every Day Smoker    Packs/day: 0.50    Types: Cigarettes  . Smokeless tobacco: Never Used  Vaping Use  . Vaping Use: Never used  Substance and Sexual Activity  . Alcohol use: Yes    Alcohol/week: 2.0 standard drinks    Types: 2 Cans of beer per week    Comment: 2 beers on the weekend  . Drug use: Not Currently  . Sexual activity: Not on file  Other Topics Concern  . Not on file  Social History Narrative   Right handed   Soda daily   Social Determinants of Health   Financial Resource Strain: Not on file  Food Insecurity: No  Food Insecurity  . Worried About Programme researcher, broadcasting/film/video in the Last Year: Never true  . Ran Out of Food in the Last Year: Never true  Transportation Needs: Unmet Transportation Needs  . Lack of Transportation (Medical): Yes  . Lack of Transportation (Non-Medical): Yes  Physical Activity: Not on file  Stress: Not on file  Social Connections: Not on file  Intimate Partner Violence: Not on file    Outpatient Medications Prior to Visit  Medication Sig Dispense Refill  . DULoxetine (CYMBALTA) 60 MG capsule TAKE ONE CAPSULE BY MOUTH EVERY DAY 30 capsule 12  . meloxicam (MOBIC) 15 MG  tablet TAKE ONE TABLET BY MOUTH EVERY DAY 30 tablet 0  . mirtazapine (REMERON) 15 MG tablet Take 1 tablet (15 mg total) by mouth at bedtime. 30 tablet 0  . gabapentin (NEURONTIN) 400 MG capsule Take 400 mg by mouth 3 (three) times daily.    Marland Kitchen amoxicillin (AMOXIL) 500 MG capsule TAKE ONE CAPSULE BY MOUTH EVERY 8 HOURS FOR 7 DAYS 21 capsule 0   No facility-administered medications prior to visit.    No Known Allergies  ROS Review of Systems  Constitutional: Negative.   Respiratory: Negative.  Negative for shortness of breath.   Cardiovascular: Negative.  Negative for chest pain and leg swelling.  Gastrointestinal: Negative.  Negative for abdominal pain and blood in stool.  Genitourinary: Negative.  Negative for difficulty urinating, dysuria, flank pain, frequency and urgency.          Musculoskeletal: Positive for back pain (chronic worsening back pain). Negative for arthralgias, myalgias and neck pain.  Skin: Negative.   Neurological: Positive for weakness and numbness. Negative for dizziness and light-headedness.  Psychiatric/Behavioral: The patient is nervous/anxious.       Objective:    Physical Exam Constitutional:      General: He is not in acute distress.    Appearance: Normal appearance. He is normal weight. He is not toxic-appearing.  Eyes:     Extraocular Movements: Extraocular movements intact.     Conjunctiva/sclera: Conjunctivae normal.     Pupils: Pupils are equal, round, and reactive to light.  Cardiovascular:     Rate and Rhythm: Normal rate and regular rhythm.     Pulses: Normal pulses.     Heart sounds: Normal heart sounds.  Pulmonary:     Effort: Pulmonary effort is normal.     Breath sounds: Normal breath sounds.  Abdominal:     General: Abdomen is flat. Bowel sounds are normal. There is no distension.     Palpations: Abdomen is soft.     Tenderness: There is no abdominal tenderness.  Musculoskeletal:     Cervical back: Normal range of motion and neck  supple. No tenderness.     Lumbar back: Decreased range of motion.     Right hip: Decreased strength.     Left hip: Deformity and tenderness present.     Right foot: No foot drop.     Left foot: No foot drop.  Skin:    General: Skin is warm and dry.     Capillary Refill: Capillary refill takes less than 2 seconds.  Neurological:     Mental Status: He is alert and oriented to person, place, and time. Mental status is at baseline.     Motor: Weakness (RLE .) present. No atrophy or abnormal muscle tone.     Gait: Gait is intact.  Psychiatric:        Mood and Affect: Mood normal.  Behavior: Behavior normal.     BP 122/65 (BP Location: Right Arm, Patient Position: Sitting, Cuff Size: Normal)   Pulse (!) 104   Temp (!) 97 F (36.1 C)   Resp 16   Ht 5\' 7"  (1.702 m)   Wt 156 lb 11.2 oz (71.1 kg)   SpO2 95%   BMI 24.54 kg/m  Wt Readings from Last 3 Encounters:  08/06/20 156 lb 11.2 oz (71.1 kg)  07/09/20 154 lb 3.2 oz (69.9 kg)  03/06/20 146 lb 9.6 oz (66.5 kg)     Health Maintenance Due  Topic Date Due  . COVID-19 Vaccine (1) Never done  . Hepatitis C Screening  Never done  . TETANUS/TDAP  Never done  . COLONOSCOPY (Pts 45-35yrs Insurance coverage will need to be confirmed)  Never done    There are no preventive care reminders to display for this patient.  Lab Results  Component Value Date   TSH 0.452 08/14/2019   Lab Results  Component Value Date   WBC 9.5 08/14/2019   HGB 15.7 08/14/2019   HCT 46.0 08/14/2019   MCV 92 08/14/2019   PLT 377 08/14/2019   Lab Results  Component Value Date   NA 140 08/14/2019   K 4.2 08/14/2019   CO2 24 08/14/2019   GLUCOSE 121 (H) 08/14/2019   BUN 9 08/14/2019   CREATININE 0.92 08/14/2019   BILITOT 0.3 08/14/2019   ALKPHOS 67 08/14/2019   AST 38 08/14/2019   ALT 25 08/14/2019   PROT 6.2 08/14/2019   ALBUMIN 4.2 08/14/2019   CALCIUM 9.8 08/14/2019   ANIONGAP 11 05/11/2019   No results found for: CHOL No results  found for: HDL No results found for: LDLCALC No results found for: TRIG No results found for: [facility] Lab Results  Component Value Date   HGBA1C 5.1 08/14/2019      Assessment & Plan:      1. Chronic left hip pain - He was advised to go to the ER for worsening symptoms  - Ambulatory referral to Orthopedic Surgery  2. Numbness and tingling - He states that his neuropathy is not improved, he will continue on gabapentin and follow up with [facility]. He was advised to go to the ER with worsening symptoms. - gabapentin (NEURONTIN) 400 MG capsule; Take 1 capsule (400 mg total) by mouth 3 (three) times daily.  Dispense: 90 capsule; Refill: 0 - Ambulatory referral to Orthopedic Surgery  3. Anxiety about health - He will follow up with [facility] Behavioral health, was advised to call Crisis help line with worsening symptoms.  4. Increased heart rate - He heart rate was elevated during visit, was 104 b p m when rechecked. He was advised to increase water intake, and go to the ER with worsening symptoms.  Follow Up: 09/03/20 or if symptoms worsens or fail to improve.

## 2020-08-07 ENCOUNTER — Other Ambulatory Visit: Payer: Self-pay

## 2020-09-01 ENCOUNTER — Ambulatory Visit: Payer: Self-pay | Admitting: Orthopaedic Surgery

## 2020-09-03 ENCOUNTER — Ambulatory Visit: Payer: Self-pay

## 2020-09-14 ENCOUNTER — Other Ambulatory Visit: Payer: Self-pay

## 2020-09-15 ENCOUNTER — Ambulatory Visit: Payer: Self-pay | Admitting: Orthopaedic Surgery

## 2020-09-29 ENCOUNTER — Ambulatory Visit: Payer: Self-pay | Admitting: Orthopaedic Surgery

## 2020-10-07 ENCOUNTER — Telehealth: Payer: Self-pay | Admitting: Pharmacist

## 2020-10-07 NOTE — Telephone Encounter (Signed)
Patient approved for medication assistance at [facility] until 07/19/21, as long as eligibility criteria continues to be met.   Vonda [facility] Administrative Assistant 

## 2020-10-09 ENCOUNTER — Other Ambulatory Visit: Payer: Self-pay

## 2020-10-14 ENCOUNTER — Other Ambulatory Visit: Payer: Self-pay

## 2021-07-23 ENCOUNTER — Other Ambulatory Visit: Payer: Self-pay

## 2022-08-10 ENCOUNTER — Other Ambulatory Visit: Payer: Self-pay

## 2023-07-10 ENCOUNTER — Other Ambulatory Visit (HOSPITAL_BASED_OUTPATIENT_CLINIC_OR_DEPARTMENT_OTHER): Payer: Self-pay
